# Patient Record
Sex: Male | Born: 1970 | Hispanic: Yes | Marital: Married | Smoking: Never smoker
Health system: Southern US, Community
[De-identification: ages and names within clinical notes are randomized; demographics above are authoritative.]

## PROBLEM LIST (undated history)

## (undated) DIAGNOSIS — Z9989 Dependence on other enabling machines and devices: Secondary | ICD-10-CM

## (undated) DIAGNOSIS — E119 Type 2 diabetes mellitus without complications: Secondary | ICD-10-CM

## (undated) DIAGNOSIS — L989 Disorder of the skin and subcutaneous tissue, unspecified: Secondary | ICD-10-CM

---

## 2017-07-17 ENCOUNTER — Emergency Department (HOSPITAL_COMMUNITY)
Admission: EM | Admit: 2017-07-17 | Discharge: 2017-07-17 | Disposition: A | Discharge status: Home / Self Care | Payer: Self-pay | Attending: Emergency Medicine | Admitting: Emergency Medicine

## 2017-07-17 ENCOUNTER — Encounter (HOSPITAL_COMMUNITY): Payer: Self-pay

## 2017-07-17 ENCOUNTER — Emergency Department (HOSPITAL_COMMUNITY): Payer: Self-pay

## 2017-07-17 ENCOUNTER — Other Ambulatory Visit: Payer: Self-pay

## 2017-07-17 DIAGNOSIS — E119 Type 2 diabetes mellitus without complications: Secondary | ICD-10-CM | POA: Insufficient documentation

## 2017-07-17 DIAGNOSIS — R1110 Vomiting, unspecified: Secondary | ICD-10-CM | POA: Insufficient documentation

## 2017-07-17 DIAGNOSIS — R109 Unspecified abdominal pain: Secondary | ICD-10-CM

## 2017-07-17 DIAGNOSIS — R1084 Generalized abdominal pain: Secondary | ICD-10-CM | POA: Insufficient documentation

## 2017-07-17 HISTORY — DX: Type 2 diabetes mellitus without complications: E11.9

## 2017-07-17 LAB — COMPREHENSIVE METABOLIC PANEL
ALT: 19 U/L (ref 17–63)
ANION GAP: 8 (ref 5–15)
AST: 16 U/L (ref 15–41)
Albumin: 4 g/dL (ref 3.5–5.0)
Alkaline Phosphatase: 63 U/L (ref 38–126)
BILIRUBIN TOTAL: 0.8 mg/dL (ref 0.3–1.2)
BUN: 16 mg/dL (ref 6–20)
CHLORIDE: 104 mmol/L (ref 101–111)
CO2: 26 mmol/L (ref 22–32)
Calcium: 9.2 mg/dL (ref 8.9–10.3)
Creatinine, Ser: 0.81 mg/dL (ref 0.61–1.24)
GFR calc non Af Amer: >60 mL/min (ref >60)
Glucose, Bld: 221 mg/dL — ABNORMAL HIGH (ref 65–99)
POTASSIUM: 4.1 mmol/L (ref 3.5–5.1)
Sodium: 138 mmol/L (ref 135–145)
TOTAL PROTEIN: 6.9 g/dL (ref 6.5–8.1)

## 2017-07-17 LAB — URINALYSIS, ROUTINE W REFLEX MICROSCOPIC
BILIRUBIN URINE: NEGATIVE
Bacteria, UA: NONE SEEN
Ketones, ur: 20 mg/dL — AB
LEUKOCYTES UA: NEGATIVE
NITRITE: NEGATIVE
Protein, ur: >=300 mg/dL — AB
SPECIFIC GRAVITY, URINE: 1.032 — AB (ref 1.005–1.030)
pH: 5 (ref 5.0–8.0)

## 2017-07-17 LAB — CBC WITH DIFFERENTIAL/PLATELET
BASOS ABS: 0.1 10*3/uL (ref 0.0–0.1)
Basophils Relative: 1 %
EOS PCT: 9 %
Eosinophils Absolute: 0.6 10*3/uL (ref 0.0–0.7)
HEMATOCRIT: 47 % (ref 39.0–52.0)
Hemoglobin: 16.2 g/dL (ref 13.0–17.0)
LYMPHS ABS: 2.4 10*3/uL (ref 0.7–4.0)
LYMPHS PCT: 36 %
MCH: 26.6 pg (ref 26.0–34.0)
MCHC: 34.5 g/dL (ref 30.0–36.0)
MCV: 77.2 fL — AB (ref 78.0–100.0)
MONOS PCT: 5 %
Monocytes Absolute: 0.4 10*3/uL (ref 0.1–1.0)
NEUTROS ABS: 3.3 10*3/uL (ref 1.7–7.7)
Neutrophils Relative %: 49 %
PLATELETS: 139 10*3/uL — AB (ref 150–400)
RBC: 6.09 MIL/uL — ABNORMAL HIGH (ref 4.22–5.81)
RDW: 13.3 % (ref 11.5–15.5)
WBC: 6.7 10*3/uL (ref 4.0–10.5)

## 2017-07-17 LAB — LIPASE, BLOOD: LIPASE: 30 U/L (ref 11–51)

## 2017-07-17 LAB — CBG MONITORING, ED: GLUCOSE-CAPILLARY: 233 mg/dL — AB (ref 65–99)

## 2017-07-17 IMAGING — CT CT RENAL STONE PROTOCOL
2 of 4 series · 16 of 46 positions shown, 18 images · non-contrast
Comparison: No priors.

CLINICAL DATA: 39-year-old male with history of right-sided flank
pain for the past 3-4 days.

EXAM:
CT ABDOMEN AND PELVIS WITHOUT CONTRAST
TECHNIQUE: Multidetector CT imaging of the abdomen and pelvis was performed
following the standard protocol without IV contrast.

[Series 2: axial st · axial · 0.83mm/px · z∈[+820,+1254]mm · 13 of 99 slices shown, 15 images]
[im 6/99  soft-tissue]
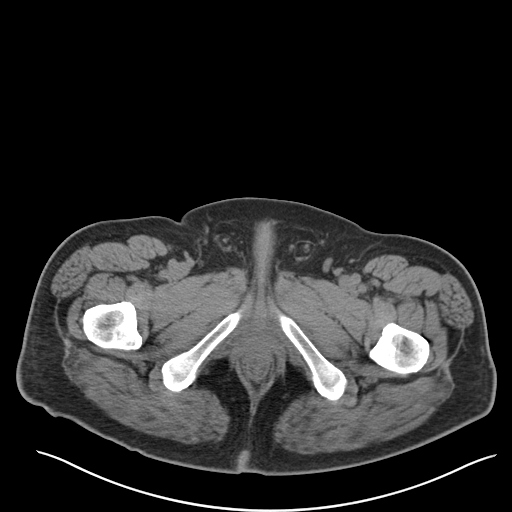
[im 6/99  bone]
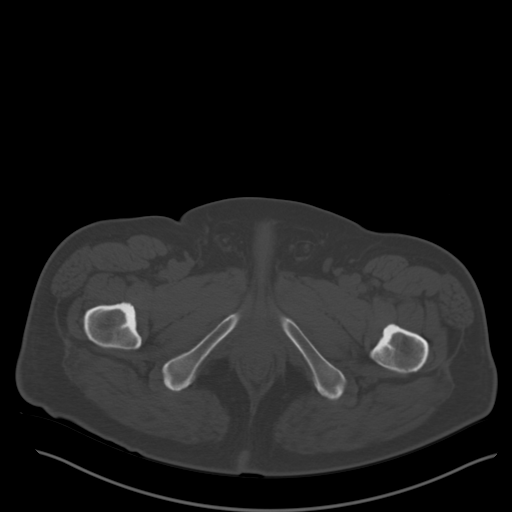
[im 12/99  soft-tissue]
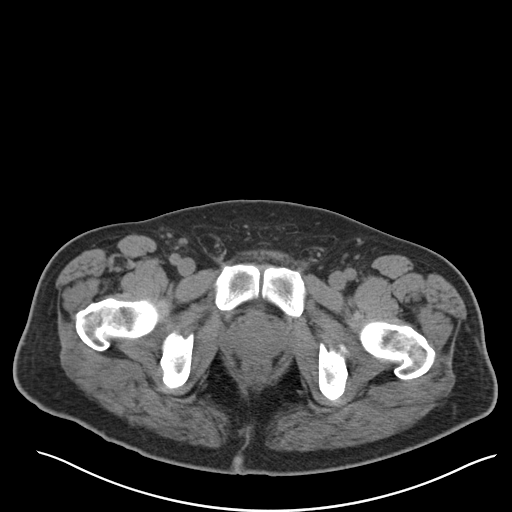
[im 24/99  soft-tissue]
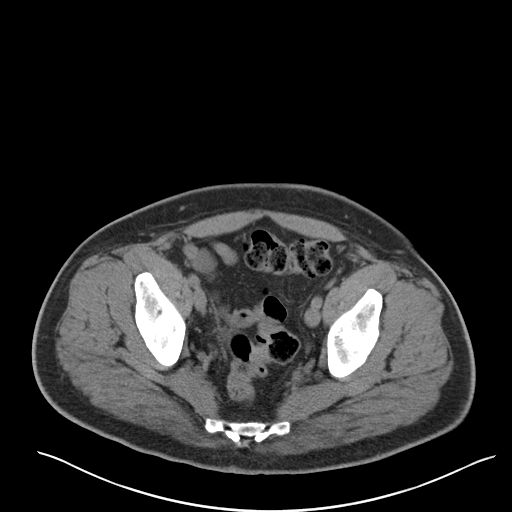
[im 29/99  soft-tissue]
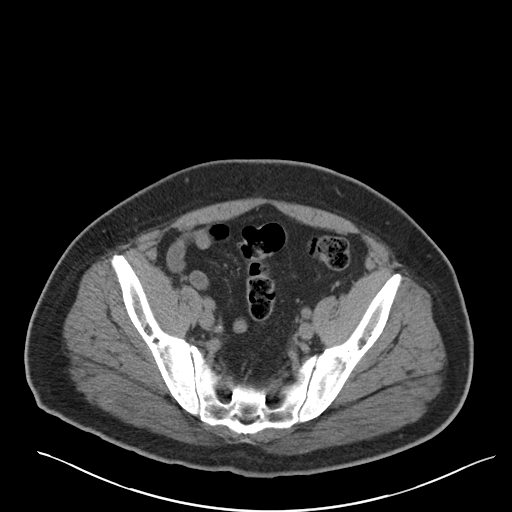
[im 35/99  soft-tissue]
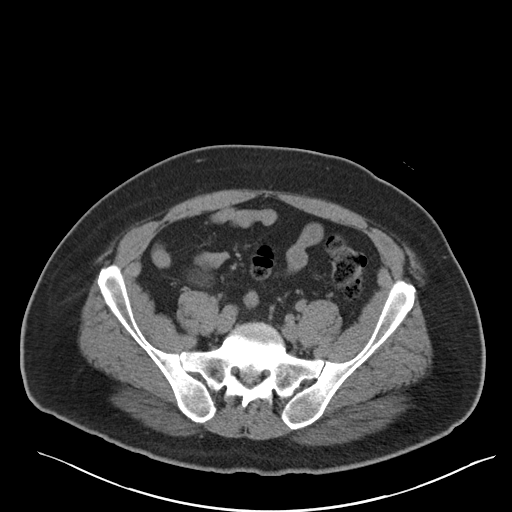
[im 41/99  soft-tissue]
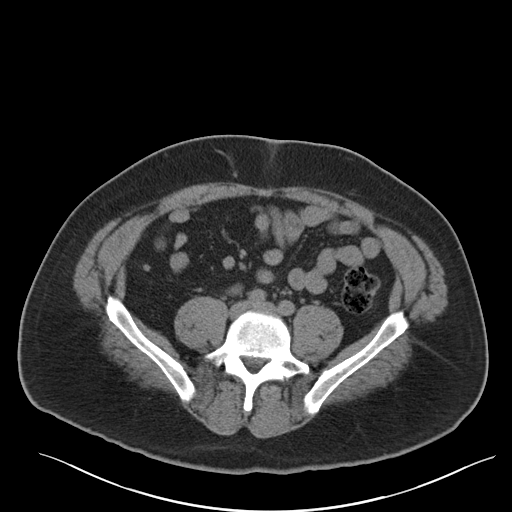
[im 52/99  soft-tissue]
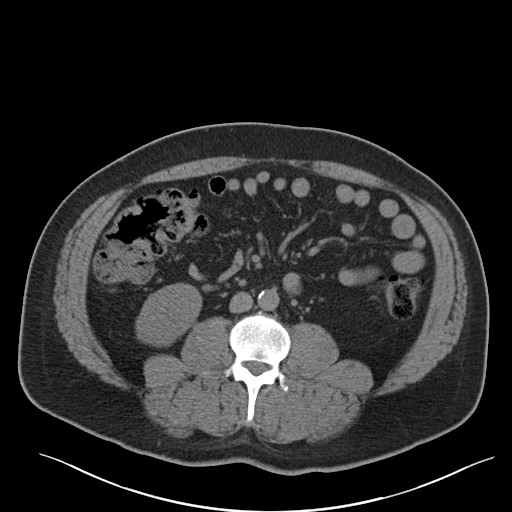
[im 58/99  soft-tissue]
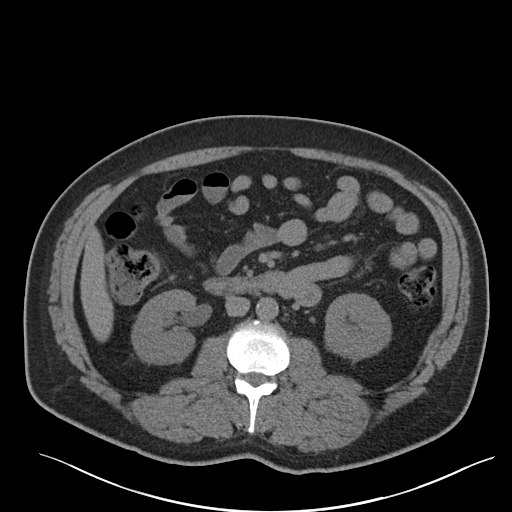
[im 64/99  soft-tissue]
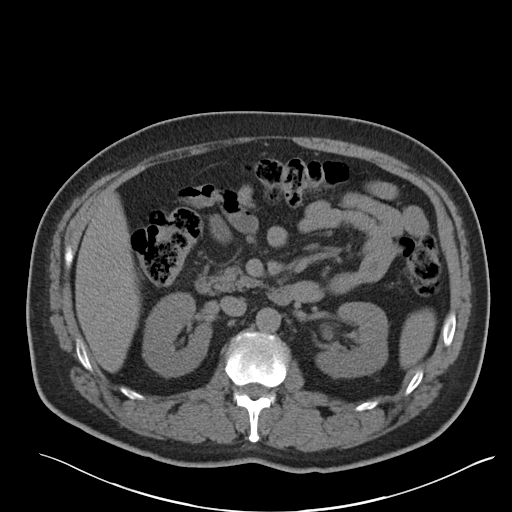
[im 64/99  bone]
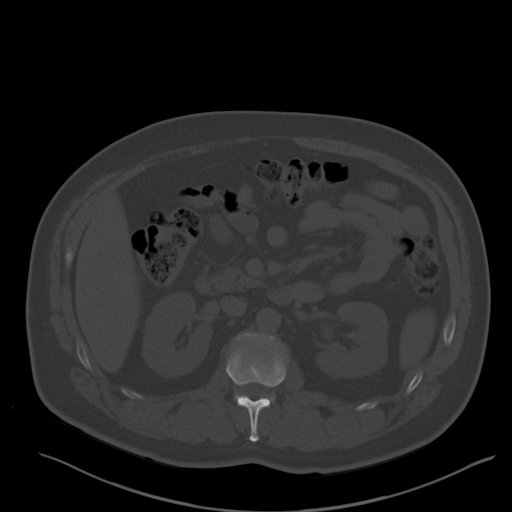
[im 70/99  soft-tissue]
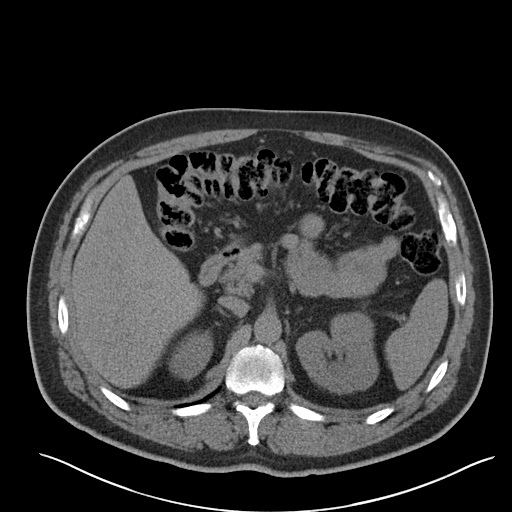
[im 75/99  soft-tissue]
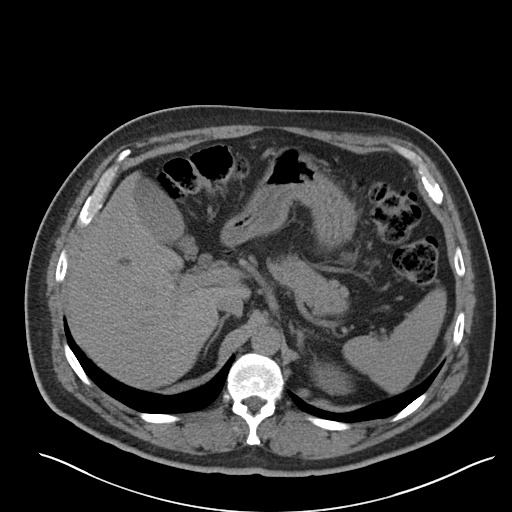
[im 87/99  soft-tissue]
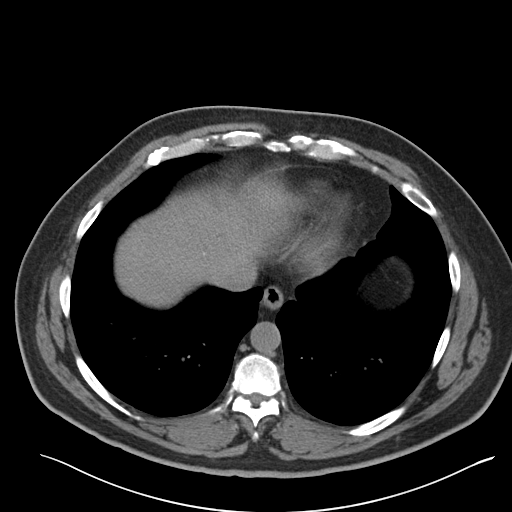
[im 93/99  soft-tissue]
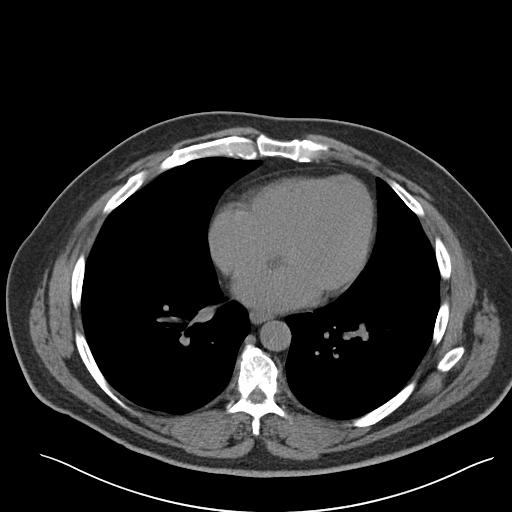

[Series 5: coronal · coronal · 0.79mm/px · 3 of 155 slices shown]
[im 52/155  soft-tissue]
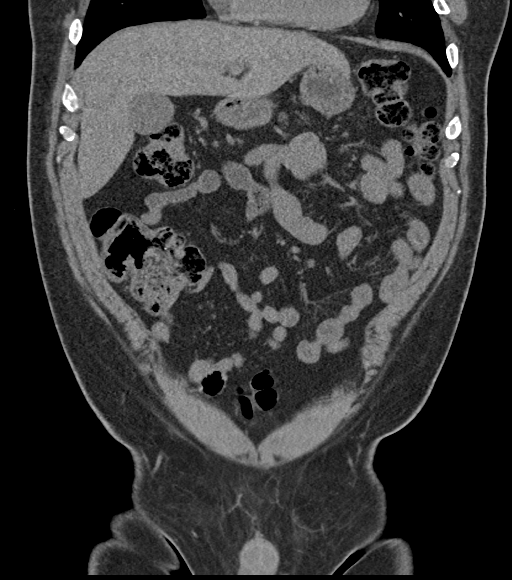
[im 69/155  soft-tissue]
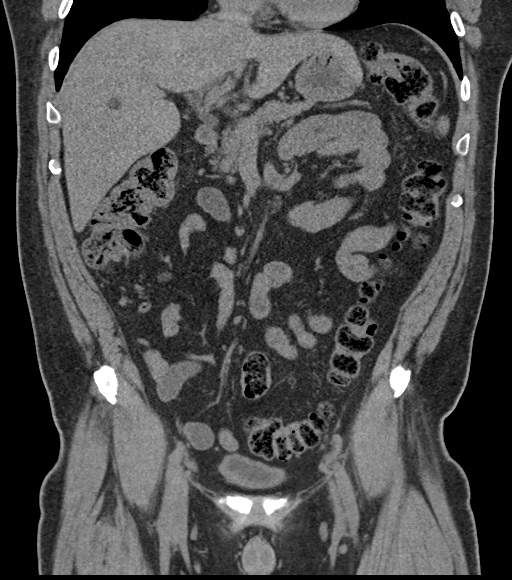
[im 86/155  soft-tissue]
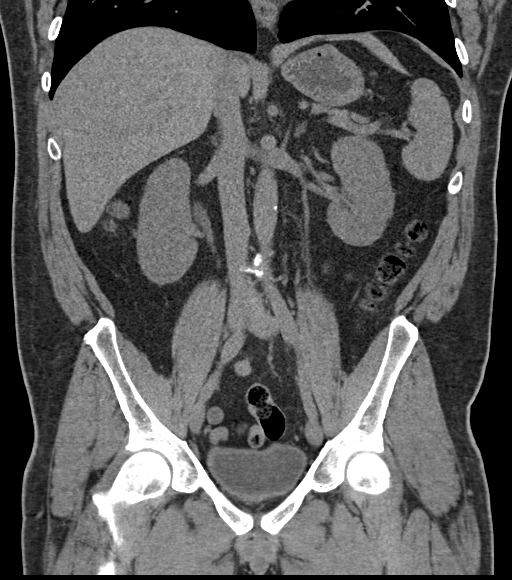

[16 of 46 positions shown; findings below may reference images not displayed]

FINDINGS: Lower chest: Unremarkable.

Hepatobiliary: 12 mm low-attenuation lesion in segment 5 of the
liver, incompletely characterized on today's noncontrast CT
examination, but statistically likely to represent tiny cyst. No
other larger more suspicious appearing hepatic lesions. Gallbladder
is unremarkable in appearance.

Pancreas: No definite pancreatic mass or peripancreatic fluid or
inflammatory changes are noted on today's noncontrast CT
examination.

Spleen: Unremarkable.

Adrenals/Urinary Tract: There are no abnormal calcifications within
the collecting system of either kidney, along the course of either
ureter, or within the lumen of the urinary bladder. No
hydroureteronephrosis or perinephric stranding to suggest urinary
tract obstruction at this time. The unenhanced appearance of the
kidneys is unremarkable bilaterally. Unenhanced appearance of the
urinary bladder is normal. Bilateral adrenal glands are normal in
appearance.

Stomach/Bowel: Normal appearance of the stomach. No pathologic
dilatation of small bowel or colon. Normal appendix.

Vascular/Lymphatic: Aortic atherosclerosis. No lymphadenopathy noted
in the abdomen or pelvis.

Reproductive: Prostate gland and seminal vesicles are unremarkable
in appearance.

Other: No significant volume of ascites.  No pneumoperitoneum.

Musculoskeletal: T 6 here are no aggressive appearing lytic or
blastic lesions noted in the visualized portions of the skeleton.
IMPRESSION: 1. No acute findings are noted in the abdomen or pelvis to account
for the patient's symptoms. Specifically, no urinary tract calculi
no findings of urinary tract obstruction are noted at this time.
2. Aortic atherosclerosis.

Aortic Atherosclerosis ([QS]-[QS]).

## 2017-07-17 MED ORDER — ONDANSETRON HCL 4 MG/2ML IJ SOLN
4.0000 mg | Freq: Once | INTRAMUSCULAR | Status: AC
  Administered 2017-07-17: 4 mg via INTRAVENOUS
  Filled 2017-07-17: qty 2

## 2017-07-17 MED ORDER — NAPROXEN 500 MG PO TABS: 500.0000 mg | ORAL_TABLET | Freq: Two times a day (BID) | ORAL | 0 refills | Status: DC

## 2017-07-17 MED ORDER — HYDROMORPHONE HCL 1 MG/ML IJ SOLN
0.5000 mg | Freq: Once | INTRAMUSCULAR | Status: AC
  Administered 2017-07-17: 0.5 mg via INTRAVENOUS
  Filled 2017-07-17: qty 1

## 2017-07-17 MED ORDER — TRAMADOL HCL 50 MG PO TABS: 50.0000 mg | ORAL_TABLET | Freq: Four times a day (QID) | ORAL | 0 refills | Status: DC | PRN

## 2017-07-17 NOTE — Discharge Instructions (Signed)
Please read and follow all provided instructions.  Your diagnoses today include:  1. Right flank pain    Tests performed today include:  Vital signs. See below for your results today.   Medications prescribed:   Tramadol - narcotic-like pain medication  DO NOT drive or perform any activities that require you to be awake and alert because this medicine can make you drowsy.    Naproxen - anti-inflammatory pain medication  Do not exceed 500mg  naproxen every 12 hours, take with food  You have been prescribed an anti-inflammatory medication or NSAID. Take with food. Take smallest effective dose for the shortest duration needed for your pain. Stop taking if you experience stomach pain or vomiting.   Take any prescribed medications only as directed.  Home care instructions:  Follow any educational materials contained in this packet.  BE VERY CAREFUL not to take multiple medicines containing Tylenol (also called acetaminophen). Doing so can lead to an overdose which can damage your liver and cause liver failure and possibly death.   Follow-up instructions: Please follow-up with your primary care provider in the next 2 days for further evaluation of your symptoms.   Return instructions:   Please return to the Emergency Department if you experience worsening symptoms.   Please return for worsening abdominal pain, persistent vomiting, fever.  Please return if you have any other emergent concerns.  Additional Information:  Your vital signs today were: BP (!) 152/88    Pulse 69    Temp 98.4 F (36.9 C) (Oral)    Resp 17    Ht 5\' 8"  (1.727 m)    Wt 99.8 kg (220 lb)    SpO2 100%    BMI 33.45 kg/m  If your blood pressure (BP) was elevated above 135/85 this visit, please have this repeated by your doctor within one month. --------------

## 2017-07-17 NOTE — ED Triage Notes (Signed)
Pt with right flank pain x 3-4 days.  No change in urination.  No hx of kidney stones.  No recent activity or injury.  Emesis early this morning.  No fever.

## 2017-07-17 NOTE — ED Notes (Signed)
Bed: WA07 Expected date:  Expected time:  Means of arrival:  Comments: 

## 2017-07-17 NOTE — ED Provider Notes (Signed)
[facility]-EMERGENCY DEPT Provider Note   CSN: [id number] Arrival date & time: 07/17/17  1221     History   Chief Complaint Chief Complaint  Patient presents with  . Flank Pain    HPI Richard Pittman is a 47 y.o. male.  Patient with no past surgical history, history of diabetes presents with 4-day history of right-sided flank pain with onset of vomiting this morning.  No reported fevers, chest pain, or shortness of breath.  No urinary changes, constipation, or diarrhea.  Pain is better when walking and sitting upright and worse when lying flat.  He denies any heavy NSAID use or alcohol use.  No injuries to the area.  No history of kidney stones.  Onset of symptoms acute.  Course is waxing and waning.     Past Medical History:  Diagnosis Date  . Diabetes mellitus without complication (HCC)     There are no active problems to display for this patient.   History reviewed. No pertinent surgical history.      Home Medications    Prior to Admission medications   Not on File    Family History History reviewed. No pertinent family history.  Social History Social History   Tobacco Use  . Smoking status: Never Smoker  . Smokeless tobacco: Never Used  Substance Use Topics  . Alcohol use: Not Currently  . Drug use: Never     Allergies   Patient has no known allergies.   Review of Systems Review of Systems  Constitutional: Negative for fever.  HENT: Negative for rhinorrhea and sore throat.   Eyes: Negative for redness.  Respiratory: Negative for cough.   Cardiovascular: Negative for chest pain.  Gastrointestinal: Positive for nausea and vomiting. Negative for abdominal pain and diarrhea.  Genitourinary: Positive for flank pain. Negative for dysuria.  Musculoskeletal: Negative for myalgias.  Skin: Negative for rash.  Neurological: Negative for headaches.     Physical Exam Updated Vital Signs BP (!) 156/90   Pulse 71   Temp  98.4 F (36.9 C) (Oral)   Resp 18   Ht 5\' 8"  (1.727 m)   Wt 99.8 kg (220 lb)   SpO2 100%   BMI 33.45 kg/m   Physical Exam  Constitutional: He appears well-developed and well-nourished.  HENT:  Head: Normocephalic and atraumatic.  Eyes: Conjunctivae are normal. Right eye exhibits no discharge. Left eye exhibits no discharge.  Neck: Normal range of motion. Neck supple.  Cardiovascular: Normal rate, regular rhythm and normal heart sounds.  Pulmonary/Chest: Effort normal and breath sounds normal.  Abdominal: Soft. There is tenderness (Mild to moderate, right lateral flank over inferior ribs). There is no rebound, no guarding, no tenderness at McBurney's point and negative Murphy's sign.  Neurological: He is alert.  Skin: Skin is warm and dry.  Psychiatric: He has a normal mood and affect.  Nursing note and vitals reviewed.    ED Treatments / Results  Labs (all labs ordered are listed, but only abnormal results are displayed) Labs Reviewed  URINALYSIS, ROUTINE W REFLEX MICROSCOPIC - Abnormal; Notable for the following components:      Result Value   Specific Gravity, Urine 1.032 (*)    Glucose, UA >=500 (*)    Hgb urine dipstick SMALL (*)    Ketones, ur 20 (*)    Protein, ur >=300 (*)    All other components within normal limits  CBC WITH DIFFERENTIAL/PLATELET - Abnormal; Notable for the following components:   RBC 6.09 (*)  MCV 77.2 (*)    Platelets 139 (*)    All other components within normal limits  COMPREHENSIVE METABOLIC PANEL - Abnormal; Notable for the following components:   Glucose, Bld 221 (*)    All other components within normal limits  CBG MONITORING, ED - Abnormal; Notable for the following components:   Glucose-Capillary 233 (*)    All other components within normal limits  LIPASE, BLOOD    EKG None  Radiology Ct Renal Stone Study  Result Date: 07/17/2017 CLINICAL DATA:  47 year old male with history of right-sided flank pain for the past 3-4 days.  EXAM: CT ABDOMEN AND PELVIS WITHOUT CONTRAST TECHNIQUE: Multidetector CT imaging of the abdomen and pelvis was performed following the standard protocol without IV contrast. COMPARISON:  No priors. FINDINGS: Lower chest: Unremarkable. Hepatobiliary: 12 mm low-attenuation lesion in segment 5 of the liver, incompletely characterized on today's noncontrast CT examination, but statistically likely to represent tiny cyst. No other larger more suspicious appearing hepatic lesions. Gallbladder is unremarkable in appearance. Pancreas: No definite pancreatic mass or peripancreatic fluid or inflammatory changes are noted on today's noncontrast CT examination. Spleen: Unremarkable. Adrenals/Urinary Tract: There are no abnormal calcifications within the collecting system of either kidney, along the course of either ureter, or within the lumen of the urinary bladder. No hydroureteronephrosis or perinephric stranding to suggest urinary tract obstruction at this time. The unenhanced appearance of the kidneys is unremarkable bilaterally. Unenhanced appearance of the urinary bladder is normal. Bilateral adrenal glands are normal in appearance. Stomach/Bowel: Normal appearance of the stomach. No pathologic dilatation of small bowel or colon. Normal appendix. Vascular/Lymphatic: Aortic atherosclerosis. No lymphadenopathy noted in the abdomen or pelvis. Reproductive: Prostate gland and seminal vesicles are unremarkable in appearance. Other: No significant volume of ascites.  No pneumoperitoneum. Musculoskeletal: T 6 here are no aggressive appearing lytic or blastic lesions noted in the visualized portions of the skeleton. IMPRESSION: 1. No acute findings are noted in the abdomen or pelvis to account for the patient's symptoms. Specifically, no urinary tract calculi no findings of urinary tract obstruction are noted at this time. 2. Aortic atherosclerosis. Aortic Atherosclerosis (ICD10-I70.0). Electronically Signed   By: Trudie Reed M.D.   On: 07/17/2017 20:24    Procedures Procedures (including critical care time)  Medications Ordered in ED Medications  HYDROmorphone (DILAUDID) injection 0.5 mg (0.5 mg Intravenous Given 07/17/17 1958)  ondansetron (ZOFRAN) injection 4 mg (4 mg Intravenous Given 07/17/17 1957)     Initial Impression / Assessment and Plan / ED Course  I have reviewed the triage vital signs and the nursing notes.  Pertinent labs & imaging results that were available during my care of the patient were reviewed by me and considered in my medical decision making (see chart for details).     Patient seen and examined. Work-up initiated. Medications ordered. UA with small hgb, ? Stone. CT ordered.   Vital signs reviewed and are as follows: BP (!) 156/90   Pulse 71   Temp 98.4 F (36.9 C) (Oral)   Resp 18   Ht 5\' 8"  (1.727 m)   Wt 99.8 kg (220 lb)   SpO2 100%   BMI 33.45 kg/m   9:36 PM patient updated on results.  CT reviewed by myself.  Patient is feeling better.  No indications for admission.  Home with naproxen and tramadol. Patient counseled on use of narcotic pain medications. Counseled not to combine these medications with others containing tylenol. Urged not to drink alcohol, drive, or  perform any other activities that requires focus while taking these medications. The patient verbalizes understanding and agrees with the plan.  The patient was urged to return to the Emergency Department immediately with worsening of current symptoms, worsening abdominal pain, persistent vomiting, blood noted in stools, fever, or any other concerns. The patient verbalized understanding.    Final Clinical Impressions(s) / ED Diagnoses   Final diagnoses:  Right flank pain   Patient with flank pain, likely MSK pain. Vitals are stable, no fever. Labs normal. Imaging reassuring. No signs of dehydration, patient is tolerating PO's. Lungs are clear and no signs suggestive of PNA. Low concern for  appendicitis, cholecystitis, pancreatitis, ruptured viscus, UTI, kidney stone, aortic dissection, aortic aneurysm or other emergent abdominal etiology. Supportive therapy indicated with return if symptoms worsen.    ED Discharge Orders        Ordered    traMADol (ULTRAM) 50 MG tablet  Every 6 hours PRN     07/17/17 2132    naproxen (NAPROSYN) 500 MG tablet  2 times daily     07/17/17 2132       Renne CriglerGeiple, Sheron Robin, PA-C 07/17/17 2138    Cathren LaineSteinl, Kevin, MD 07/17/17 [phone]

## 2018-12-20 NOTE — Progress Notes (Signed)
[facility] - Clinic Note  12/21/2018     CHIEF COMPLAINT Patient presents for Retina Evaluation and Diabetic Eye Exam   HISTORY OF PRESENT ILLNESS: Richard Pittman is a 48 y.o. male who presents to the clinic today for:   HPI    Retina Evaluation    In both eyes.  This started 2 months ago.  Associated Symptoms Floaters.  Negative for Flashes, Pain, Trauma, Fever, Weight Loss, Scalp Tenderness, Redness, Distortion, Photophobia, Jaw Claudication, Fatigue, Shoulder/Hip pain, Glare and Blind Spot.  Context:  distance vision, mid-range vision and near vision.  Treatments tried include artificial tears.  Response to treatment was no improvement.  I, the attending physician,  performed the HPI with the patient and updated documentation appropriately.          Diabetic Eye Exam    Vision is stable.  Associated Symptoms Negative for Flashes, Pain, Trauma, Fever, Weight Loss, Scalp Tenderness, Redness, Floaters, Distortion, Photophobia, Jaw Claudication, Fatigue, Shoulder/Hip pain, Glare and Blind Spot.  Diabetes characteristics include Type 2.  This started 10 years ago.  Blood sugar level is controlled.  I, the attending physician,  performed the HPI with the patient and updated documentation appropriately.          Comments    Referral of Dr. Krista Blue for DME OU. Patient is accompanied by interpretor Raquel of [facility], patient states 3-4 weeks ago "everything was dark, I could only see white in my right eye, now I could see things".Pt  Is seeing "black spots" Os x 2 months, denies ocular pain and flashes.  Pt is DM2 x 10 yrs, BS 124 this am, A1C 9.0 (2 mos ago). Pt states his Bs are "less than 190"        Last edited by Rennis Chris, MD on 12/21/2018  8:55 AM. (History)    pt is with an interpreter today, he states he has had a problem with his vision for about 5 months, he states he started seeing black spots in his left eye, but they went away and then he started  seeing black spots in his right eye, he states about 2 months ago he could only see white light with his right eye, he states recently he is starting to see a little bit more with his right eye, he states he has a black spot on the white part of his eye that has gotten bigger in the past couple of months, pt states yesterday was the first time he has seen an eye dr, pt endorses being diabetic, he states his A1c was close to 9 about 2 months ago, which is the highest it has ever been, he states he takes oral medication for diabetes, he does not take any medication for blood pressure or cholesterol, his mother is also diabetic, but he does not think she has any vision problems, 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 [place], [place] [address]  HISTORICAL INFORMATION:   Selected notes from the MEDICAL RECORD NUMBER Referred by Dr. Krista Blue for DM with traction RD  PMH: DM   CURRENT MEDICATIONS: No current outpatient medications on file. (Ophthalmic Drugs)   No current facility-administered medications for this visit.  (Ophthalmic Drugs)   Current Outpatient Medications (Other)  Medication Sig  . glipiZIDE (GLUCOTROL) 5 MG tablet Take by mouth 2 (two) times daily before a meal.  . metFORMIN (GLUMETZA) 1000 MG (MOD) 24 hr tablet Take 1,000  mg by mouth 2 (two) times daily with a meal.  . naproxen (NAPROSYN) 500 MG tablet Take 1 tablet (500 mg total) by mouth 2 (two) times daily.  . traMADol (ULTRAM) 50 MG tablet Take 1 tablet (50 mg total) by mouth every 6 (six) hours as needed.   No current facility-administered medications for this visit.  (Other)      REVIEW OF SYSTEMS: ROS    Positive for: Endocrine, Eyes   Negative for: Constitutional, Gastrointestinal, Neurological, Skin, Genitourinary, Musculoskeletal, HENT, Cardiovascular, Respiratory, Psychiatric, Allergic/Imm, Heme/Lymph   Last edited by Zenovia Jordan, LPN on 59/02/6382  6:65 AM.  (History)       ALLERGIES No Known Allergies  PAST MEDICAL HISTORY Past Medical History:  Diagnosis Date  . Diabetes mellitus without complication ([place])    History reviewed. No pertinent surgical history.  FAMILY HISTORY Family History  Problem Relation Age of Onset  . Diabetes Mother     SOCIAL HISTORY Social History   Tobacco Use  . Smoking status: Never Smoker  . Smokeless tobacco: Never Used  Substance Use Topics  . Alcohol use: Not Currently  . Drug use: Never         OPHTHALMIC EXAM:  Base Eye Exam    Visual Acuity (Snellen - Linear)      Right Left   Dist cc CF at 3' 20/400   Dist ph cc  NI       Tonometry (Tonopen, 8:32 AM)      Right Left   Pressure 18 16       Pupils      Dark Light Shape React APD   Right 3 2 Round Slow None   Left 3 2 Round Slow None       Visual Fields (Counting fingers)      Left Right   Restrictions Partial outer superior temporal, inferior temporal, superior nasal, inferior nasal deficiencies Partial outer superior temporal, inferior temporal, superior nasal, inferior nasal deficiencies       Extraocular Movement      Right Left    Full, Ortho Full, Ortho       Neuro/Psych    Oriented x3: Yes       Dilation    Both eyes: 1.0% Mydriacyl, 2.5% Phenylephrine @ 8:28 AM        Slit Lamp and Fundus Exam    Slit Lamp Exam      Right Left   Lids/Lashes Dermatochalasis - upper lid, Meibomian gland dysfunction Dermatochalasis - upper lid, Meibomian gland dysfunction   Conjunctiva/Sclera Small nasal and temporal Pinguecula Small nasal and temporal Pinguecula   Cornea Trace Punctate epithelial erosions Trace Punctate epithelial erosions   Anterior Chamber Deep and clear, narrow temporal angle Deep and clear, narrow temporal angle   Iris no NVI, Round and poorly dilated to 5.56mm Round and poorly dilated to 5.77mm, no NVI   Lens 2+ Nuclear sclerosis, 2+ Cortical cataract 2+ Nuclear sclerosis, 2+ Cortical cataract    Vitreous Diffuse vitreous Hemorrhage Scattered vitreous Hemorrhage, large blood clot inferiorly       Fundus Exam      Right Left   Disc Obscured by dense pre-retinal fibrosis and NVD +fibrotic NVD obscuring disc, +hemorrhage   Macula obscured by heme and fibrosis pre-retinal fibrosis, IRH, Exudates, severe tractional fibrosis and NVE along superior arcades   Vessels no view Fibrotic NVE and NVD   Periphery No view of retina, scattered vitreous fibrosis and hemorrhage Extensive fibrosis and old hemorrhage, large  pre-retinal hemorrhage at 0730          IMAGING AND PROCEDURES  Imaging and Procedures for @TODAY @  OCT, Retina - OU - Both Eyes       Left Eye Quality was poor. Central Foveal Thickness: 1028. Progression has no prior data. Findings include intraretinal fluid, subretinal fluid, abnormal foveal contour, vitreous traction, preretinal fibrosis.   Notes *Images captured and stored on drive  Diagnosis / Impression:  OD: no view OS: poor image; significant pre-retinal fibrosis with tractional edema  Clinical management:  See below  Abbreviations: NFP - Normal foveal profile. CME - cystoid macular edema. PED - pigment epithelial detachment. IRF - intraretinal fluid. SRF - subretinal fluid. EZ - ellipsoid zone. ERM - epiretinal membrane. ORA - outer retinal atrophy. ORT - outer retinal tubulation. SRHM - subretinal hyper-reflective material                 ASSESSMENT/PLAN:    ICD-10-CM   1. Proliferative diabetic retinopathy of both eyes with macular edema associated with type 2 diabetes mellitus (HCC)  R60.4540E11.3513   2. Retinal edema  H35.81 OCT, Retina - OU - Both Eyes  3. Vitreous hemorrhage of both eyes (HCC)  H43.13   4. Combined forms of age-related cataract of both eyes  H25.813     1-3. Proliferative diabetic retinopathy w/ vitreous hemorrhage and traction OU (OD > OS)  - The incidence, risk factors for progression, natural history and treatment options for  diabetic retinopathy were discussed with patient.    - The need for close monitoring of blood glucose, blood pressure, and serum lipids, avoiding cigarette or any type of tobacco, and the need for long term follow up was also discussed with patient.  - exam shows severe vitreous/preretinal/subhyaloid heme OU (OD>OS) and tractional fibrosis OU  - discussed findings, guarded prognosis and treatment options  - likely needs PPV OU  - [facility] and management  4. Mixed form age related cataract OU  - The symptoms of cataract, surgical options, and treatments and risks were discussed with patient.  - discussed diagnosis and progression   Ophthalmic Meds Ordered this visit:  No orders of the defined types were placed in this encounter.      Return if symptoms worsen or fail to improve.  There are no Patient Instructions on file for this visit.   Explained the diagnoses, plan, and follow up with the patient and they expressed understanding.  Patient expressed understanding of the importance of proper follow up care.   This document serves as a record of services personally performed by Karie ChimeraBrian G. Sriram Febles, MD, PhD. It was created on their behalf by Annalee Gentaaryl Barber, COMT. The creation of this record is the provider's dictation and/or activities during the visit.  Electronically signed by: Annalee Gentaaryl Barber, COMT 12/22/18 10:02 AM   Karie ChimeraBrian G. Allure Greaser, M.D., Ph.D. Diseases & Surgery of the Retina and Vitreous [facility]  I have reviewed the above documentation for accuracy and completeness, and I agree with the above. Karie ChimeraBrian G. Ruhani Umland, M.D., Ph.D. 12/22/18 10:02 AM     Abbreviations: M myopia (nearsighted); A astigmatism; H hyperopia (farsighted); P presbyopia; Mrx spectacle prescription;  CTL contact lenses; OD right eye; OS left eye; OU both eyes  XT exotropia; ET esotropia; PEK punctate epithelial keratitis; PEE punctate epithelial  erosions; DES dry eye syndrome; MGD meibomian gland dysfunction; ATs artificial tears; PFAT's preservative free artificial tears; NSC nuclear  sclerotic cataract; PSC posterior subcapsular cataract; ERM epi-retinal membrane; PVD posterior vitreous detachment; RD retinal detachment; DM diabetes mellitus; DR diabetic retinopathy; NPDR non-proliferative diabetic retinopathy; PDR proliferative diabetic retinopathy; CSME clinically significant macular edema; DME diabetic macular edema; dbh dot blot hemorrhages; CWS cotton wool spot; POAG primary open angle glaucoma; C/D cup-to-disc ratio; HVF humphrey visual field; GVF goldmann visual field; OCT optical coherence tomography; IOP intraocular pressure; BRVO Branch retinal vein occlusion; CRVO central retinal vein occlusion; CRAO central retinal artery occlusion; BRAO branch retinal artery occlusion; RT retinal tear; SB scleral buckle; PPV pars plana vitrectomy; VH Vitreous hemorrhage; PRP panretinal laser photocoagulation; IVK intravitreal kenalog; VMT vitreomacular traction; MH Macular hole;  NVD neovascularization of the disc; NVE neovascularization elsewhere; AREDS age related eye disease study; ARMD age related macular degeneration; POAG primary open angle glaucoma; EBMD epithelial/anterior basement membrane dystrophy; ACIOL anterior chamber intraocular lens; IOL intraocular lens; PCIOL posterior chamber intraocular lens; Phaco/IOL phacoemulsification with intraocular lens placement; PRK photorefractive keratectomy; LASIK laser assisted in situ keratomileusis; HTN hypertension; DM diabetes mellitus; COPD chronic obstructive pulmonary disease

## 2018-12-21 ENCOUNTER — Encounter (INDEPENDENT_AMBULATORY_CARE_PROVIDER_SITE_OTHER): Payer: Self-pay | Admitting: Ophthalmology

## 2018-12-21 ENCOUNTER — Ambulatory Visit (INDEPENDENT_AMBULATORY_CARE_PROVIDER_SITE_OTHER): Payer: Self-pay | Admitting: Ophthalmology

## 2018-12-21 DIAGNOSIS — H4313 Vitreous hemorrhage, bilateral: Secondary | ICD-10-CM

## 2018-12-21 DIAGNOSIS — H25813 Combined forms of age-related cataract, bilateral: Secondary | ICD-10-CM

## 2018-12-21 DIAGNOSIS — E113513 Type 2 diabetes mellitus with proliferative diabetic retinopathy with macular edema, bilateral: Secondary | ICD-10-CM

## 2018-12-21 DIAGNOSIS — H3581 Retinal edema: Secondary | ICD-10-CM

## 2019-07-18 HISTORY — PX: CATARACT EXTRACTION: SUR2

## 2021-01-06 ENCOUNTER — Other Ambulatory Visit: Payer: Self-pay

## 2021-01-06 ENCOUNTER — Encounter (HOSPITAL_COMMUNITY): Payer: Self-pay

## 2021-01-06 ENCOUNTER — Ambulatory Visit (HOSPITAL_COMMUNITY)
Admission: EM | Admit: 2021-01-06 | Discharge: 2021-01-06 | Disposition: A | Discharge status: Home / Self Care | Payer: Self-pay | Attending: Urgent Care | Admitting: Urgent Care

## 2021-01-06 ENCOUNTER — Ambulatory Visit (INDEPENDENT_AMBULATORY_CARE_PROVIDER_SITE_OTHER): Payer: Self-pay

## 2021-01-06 DIAGNOSIS — M79671 Pain in right foot: Secondary | ICD-10-CM

## 2021-01-06 DIAGNOSIS — L089 Local infection of the skin and subcutaneous tissue, unspecified: Secondary | ICD-10-CM | POA: Insufficient documentation

## 2021-01-06 DIAGNOSIS — M7989 Other specified soft tissue disorders: Secondary | ICD-10-CM | POA: Insufficient documentation

## 2021-01-06 DIAGNOSIS — M79674 Pain in right toe(s): Secondary | ICD-10-CM | POA: Insufficient documentation

## 2021-01-06 DIAGNOSIS — E119 Type 2 diabetes mellitus without complications: Secondary | ICD-10-CM | POA: Insufficient documentation

## 2021-01-06 DIAGNOSIS — E11628 Type 2 diabetes mellitus with other skin complications: Secondary | ICD-10-CM

## 2021-01-06 DIAGNOSIS — T148XXA Other injury of unspecified body region, initial encounter: Secondary | ICD-10-CM | POA: Insufficient documentation

## 2021-01-06 LAB — COMPREHENSIVE METABOLIC PANEL
ALT: 17 U/L (ref 0–44)
AST: 15 U/L (ref 15–41)
Albumin: 2.9 g/dL — ABNORMAL LOW (ref 3.5–5.0)
Alkaline Phosphatase: 91 U/L (ref 38–126)
Anion gap: 9 (ref 5–15)
BUN: 19 mg/dL (ref 6–20)
CO2: 23 mmol/L (ref 22–32)
Calcium: 8.5 mg/dL — ABNORMAL LOW (ref 8.9–10.3)
Chloride: 99 mmol/L (ref 98–111)
Creatinine, Ser: 1.49 mg/dL — ABNORMAL HIGH (ref 0.61–1.24)
GFR, Estimated: 56 mL/min — ABNORMAL LOW (ref >60)
Glucose, Bld: 402 mg/dL — ABNORMAL HIGH (ref 70–99)
Potassium: 5.1 mmol/L (ref 3.5–5.1)
Sodium: 131 mmol/L — ABNORMAL LOW (ref 135–145)
Total Bilirubin: 0.6 mg/dL (ref 0.3–1.2)
Total Protein: 6.6 g/dL (ref 6.5–8.1)

## 2021-01-06 LAB — CBC WITH DIFFERENTIAL/PLATELET
Abs Immature Granulocytes: 0.07 10*3/uL (ref 0.00–0.07)
Basophils Absolute: 0.1 10*3/uL (ref 0.0–0.1)
Basophils Relative: 0 %
Eosinophils Absolute: 0.1 10*3/uL (ref 0.0–0.5)
Eosinophils Relative: 1 %
HCT: 35.8 % — ABNORMAL LOW (ref 39.0–52.0)
Hemoglobin: 11.6 g/dL — ABNORMAL LOW (ref 13.0–17.0)
Immature Granulocytes: 1 %
Lymphocytes Relative: 8 %
Lymphs Abs: 1.1 10*3/uL (ref 0.7–4.0)
MCH: 25.6 pg — ABNORMAL LOW (ref 26.0–34.0)
MCHC: 32.4 g/dL (ref 30.0–36.0)
MCV: 79 fL — ABNORMAL LOW (ref 80.0–100.0)
Monocytes Absolute: 0.7 10*3/uL (ref 0.1–1.0)
Monocytes Relative: 5 %
Neutro Abs: 12.1 10*3/uL — ABNORMAL HIGH (ref 1.7–7.7)
Neutrophils Relative %: 85 %
Platelets: 257 10*3/uL (ref 150–400)
RBC: 4.53 MIL/uL (ref 4.22–5.81)
RDW: 12.8 % (ref 11.5–15.5)
WBC: 14.1 10*3/uL — ABNORMAL HIGH (ref 4.0–10.5)
nRBC: 0 % (ref 0.0–0.2)

## 2021-01-06 IMAGING — DX DG FOOT COMPLETE 3+V*R*
3 series · 3 of 3 positions shown · non-contrast
Comparison: None.

CLINICAL DATA: Right foot pain.  History of puncture wound

EXAM:
RIGHT FOOT COMPLETE - 3+ VIEW

[foot ap]
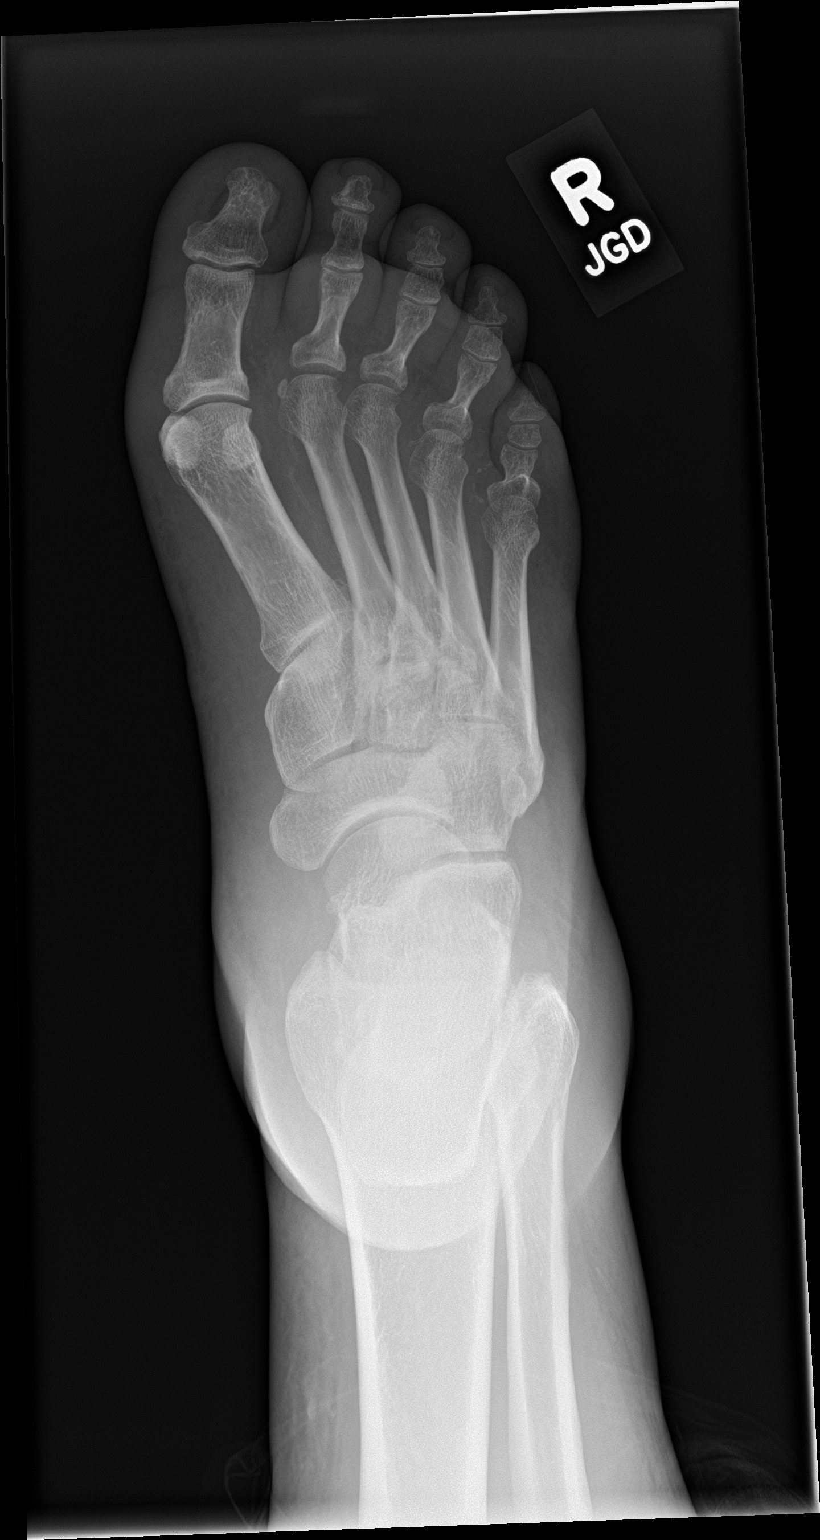

[foot obl]
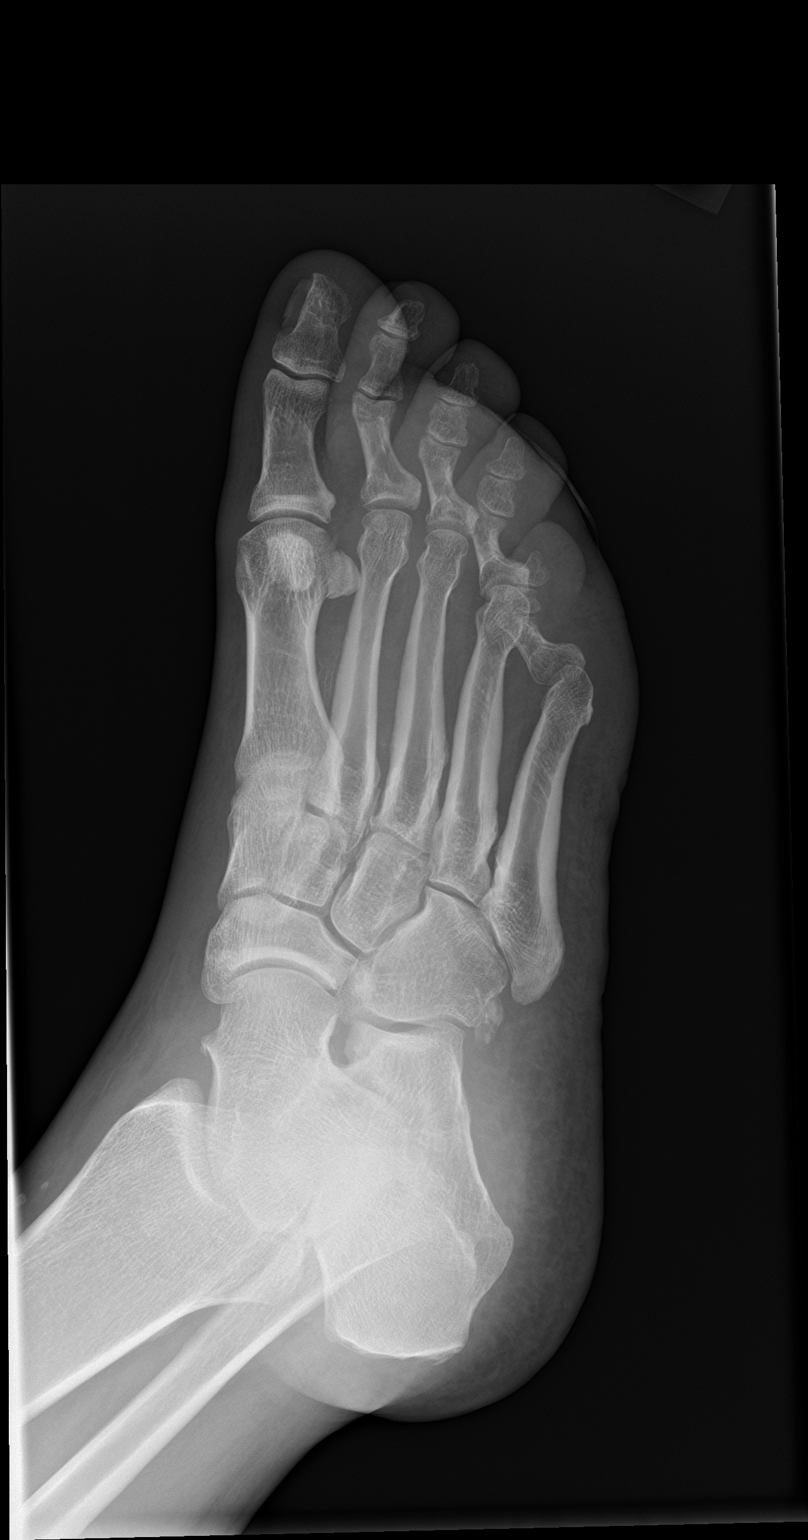

[foot lat]
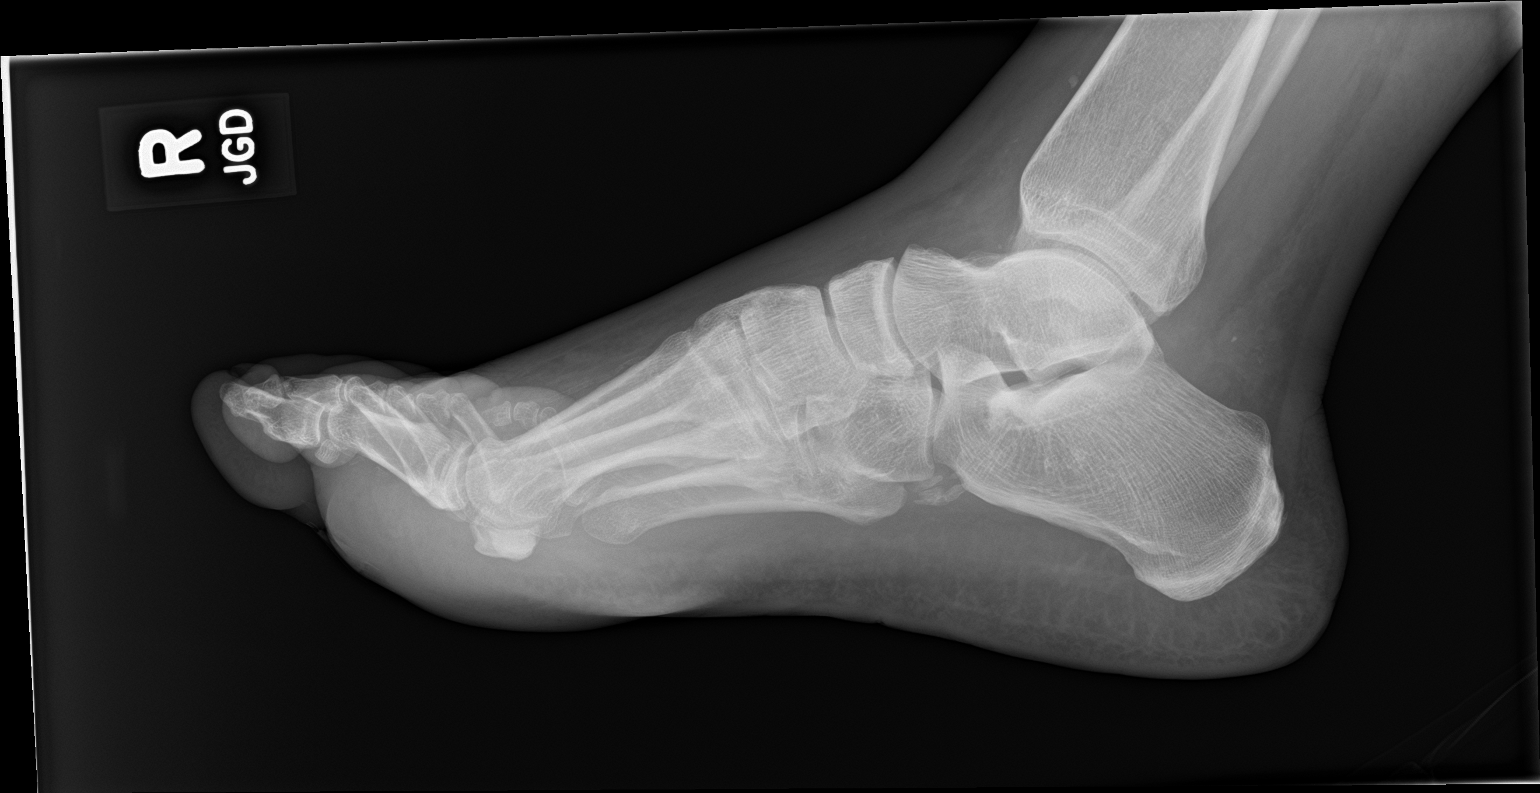

[3 of 3 positions shown; findings below may reference images not displayed]

FINDINGS: There is no evidence of fracture or dislocation. Joint spaces are
relatively preserved. No erosion or periosteal elevation is evident.
Soft tissue swelling the plantar aspect of the foot in the region of
the distal forefoot. No soft tissue gas or radiopaque foreign body
evident. Vascular calcifications are present.
IMPRESSION: 1. Soft tissue swelling of the plantar aspect of the foot.
2. No acute osseous abnormality.  No radiopaque foreign body.

## 2021-01-06 MED ORDER — DOXYCYCLINE HYCLATE 100 MG PO CAPS: 100.0000 mg | ORAL_CAPSULE | Freq: Two times a day (BID) | ORAL | 0 refills | Status: DC

## 2021-01-06 MED ORDER — IBUPROFEN 800 MG PO TABS
ORAL_TABLET | ORAL | Status: AC
  Filled 2021-01-06: qty 1

## 2021-01-06 MED ORDER — NAPROXEN 500 MG PO TABS: 500.0000 mg | ORAL_TABLET | Freq: Two times a day (BID) | ORAL | 0 refills | Status: DC

## 2021-01-06 MED ORDER — IBUPROFEN 800 MG PO TABS
800.0000 mg | ORAL_TABLET | Freq: Once | ORAL | Status: AC
  Administered 2021-01-06: 800 mg via ORAL

## 2021-01-06 NOTE — ED Provider Notes (Signed)
[facility]   MRN: [id number] DOB: 1970-02-26  Subjective:   Richard Pittman is a 50 y.o. male presenting for 2-3 week history of persistent and worsening right foot pain, swelling, having drainage of pus in the past day.  Symptoms started after he stepped through a nail that punctured into his shoe and scraped the lower part of his foot.  He was seen and had his Tdap updated, was started on cephalexin.  He completed the entire course of antibiotics but still worsening.  Has not had fever, pain and has been using Tylenol with minimal relief.  No current facility-administered medications for this encounter.  Current Outpatient Medications:    glipiZIDE (GLUCOTROL) 5 MG tablet, Take by mouth 2 (two) times daily before a meal., Disp: , Rfl:    metFORMIN (GLUMETZA) 1000 MG (MOD) 24 hr tablet, Take 1,000 mg by mouth 2 (two) times daily with a meal., Disp: , Rfl:    naproxen (NAPROSYN) 500 MG tablet, Take 1 tablet (500 mg total) by mouth 2 (two) times daily., Disp: 20 tablet, Rfl: 0   traMADol (ULTRAM) 50 MG tablet, Take 1 tablet (50 mg total) by mouth every 6 (six) hours as needed., Disp: 10 tablet, Rfl: 0   No Known Allergies  Past Medical History:  Diagnosis Date   Diabetes mellitus without complication (HCC)      History reviewed. No pertinent surgical history.  Family History  Problem Relation Age of Onset   Diabetes Mother     Social History   Tobacco Use   Smoking status: Never   Smokeless tobacco: Never  Vaping Use   Vaping Use: Never used  Substance Use Topics   Alcohol use: Not Currently   Drug use: Never    ROS   Objective:   Vitals: BP 133/75 (BP Location: Right Arm)   Pulse 95   Temp (!) 100.9 F (38.3 C) (Oral)   Resp 18   SpO2 96%   Physical Exam Constitutional:      General: He is not in acute distress.    Appearance: Normal appearance. He is well-developed. He is not ill-appearing, toxic-appearing or diaphoretic.   HENT:     Head: Normocephalic and atraumatic.     Right Ear: External ear normal.     Left Ear: External ear normal.     Nose: Nose normal.     Mouth/Throat:     Mouth: Mucous membranes are moist.     Pharynx: Oropharynx is clear.  Eyes:     General: No scleral icterus.    Extraocular Movements: Extraocular movements intact.     Pupils: Pupils are equal, round, and reactive to light.  Cardiovascular:     Rate and Rhythm: Normal rate and regular rhythm.     Heart sounds: Normal heart sounds. No murmur heard.   No friction rub. No gallop.  Pulmonary:     Effort: Pulmonary effort is normal. No respiratory distress.     Breath sounds: Normal breath sounds. No stridor. No wheezing, rhonchi or rales.  Musculoskeletal:     Right foot: Decreased range of motion. Normal capillary refill. Tenderness present. No swelling, deformity, laceration, bony tenderness or crepitus.       Feet:  Neurological:     Mental Status: He is alert and oriented to person, place, and time.  Psychiatric:        Mood and Affect: Mood normal.        Behavior: Behavior normal.  Thought Content: Thought content normal.    DG Foot Complete Right  Result Date: 01/06/2021 CLINICAL DATA:  Right foot pain.  History of puncture wound EXAM: RIGHT FOOT COMPLETE - 3+ VIEW COMPARISON:  None. FINDINGS: There is no evidence of fracture or dislocation. Joint spaces are relatively preserved. No erosion or periosteal elevation is evident. Soft tissue swelling the plantar aspect of the foot in the region of the distal forefoot. No soft tissue gas or radiopaque foreign body evident. Vascular calcifications are present. IMPRESSION: 1. Soft tissue swelling of the plantar aspect of the foot. 2. No acute osseous abnormality.  No radiopaque foreign body. Electronically Signed   By: Duanne Guess D.O.   On: 01/06/2021 12:25     Assessment and Plan :   PDMP not reviewed this encounter.  1. Wound infection   2. Pain and  swelling of toe of right foot   3. Right foot pain   4. Type 2 diabetes mellitus treated without insulin (HCC)    Will trial doxycycline, naproxen, reviewed wound care.  Lab results are pending.  Patient prefers to avoid the ER for now.  Emphasized strict precautions within 48 hours as he is high risk for having severe infection.  I was agreeable to a 48-hour window given that the x-ray did not show osteomyelitis or subcutaneous emphysema of the soft tissue. Counseled patient on potential for adverse effects with medications prescribed/recommended today, ER and return-to-clinic precautions discussed, patient verbalized understanding.    Wallis Bamberg, PA-C 01/06/21 1315

## 2021-01-06 NOTE — ED Triage Notes (Signed)
Pt reports swelling in right foot x 2 weeks, pus x 1 day. Reports he stepped over a nail. Pt was treated it with cephalexin and had a Tdap. Denies pain.

## 2021-01-07 ENCOUNTER — Emergency Department (HOSPITAL_COMMUNITY): Payer: Self-pay

## 2021-01-07 ENCOUNTER — Encounter (HOSPITAL_COMMUNITY): Payer: Self-pay

## 2021-01-07 ENCOUNTER — Inpatient Hospital Stay (HOSPITAL_COMMUNITY)
Admission: EM | Admit: 2021-01-07 | Discharge: 2021-01-11 | DRG: 854 | Disposition: A | Discharge status: Home / Self Care | Payer: Self-pay | Attending: Obstetrics and Gynecology | Admitting: Obstetrics and Gynecology

## 2021-01-07 ENCOUNTER — Inpatient Hospital Stay (HOSPITAL_COMMUNITY): Payer: Self-pay

## 2021-01-07 DIAGNOSIS — L97519 Non-pressure chronic ulcer of other part of right foot with unspecified severity: Secondary | ICD-10-CM | POA: Diagnosis present

## 2021-01-07 DIAGNOSIS — E669 Obesity, unspecified: Secondary | ICD-10-CM | POA: Diagnosis present

## 2021-01-07 DIAGNOSIS — L089 Local infection of the skin and subcutaneous tissue, unspecified: Principal | ICD-10-CM | POA: Diagnosis present

## 2021-01-07 DIAGNOSIS — E11621 Type 2 diabetes mellitus with foot ulcer: Secondary | ICD-10-CM | POA: Diagnosis present

## 2021-01-07 DIAGNOSIS — E1152 Type 2 diabetes mellitus with diabetic peripheral angiopathy with gangrene: Secondary | ICD-10-CM | POA: Diagnosis present

## 2021-01-07 DIAGNOSIS — Z20822 Contact with and (suspected) exposure to covid-19: Secondary | ICD-10-CM | POA: Diagnosis present

## 2021-01-07 DIAGNOSIS — R6520 Severe sepsis without septic shock: Secondary | ICD-10-CM | POA: Diagnosis present

## 2021-01-07 DIAGNOSIS — N179 Acute kidney failure, unspecified: Secondary | ICD-10-CM

## 2021-01-07 DIAGNOSIS — D638 Anemia in other chronic diseases classified elsewhere: Secondary | ICD-10-CM

## 2021-01-07 DIAGNOSIS — A401 Sepsis due to streptococcus, group B: Principal | ICD-10-CM | POA: Diagnosis present

## 2021-01-07 DIAGNOSIS — B999 Unspecified infectious disease: Secondary | ICD-10-CM

## 2021-01-07 DIAGNOSIS — Z79899 Other long term (current) drug therapy: Secondary | ICD-10-CM

## 2021-01-07 DIAGNOSIS — Z6833 Body mass index (BMI) 33.0-33.9, adult: Secondary | ICD-10-CM

## 2021-01-07 DIAGNOSIS — E1165 Type 2 diabetes mellitus with hyperglycemia: Secondary | ICD-10-CM | POA: Diagnosis present

## 2021-01-07 DIAGNOSIS — E871 Hypo-osmolality and hyponatremia: Secondary | ICD-10-CM

## 2021-01-07 DIAGNOSIS — L03115 Cellulitis of right lower limb: Secondary | ICD-10-CM | POA: Diagnosis present

## 2021-01-07 DIAGNOSIS — L02611 Cutaneous abscess of right foot: Secondary | ICD-10-CM | POA: Diagnosis present

## 2021-01-07 DIAGNOSIS — S91331A Puncture wound without foreign body, right foot, initial encounter: Secondary | ICD-10-CM | POA: Diagnosis present

## 2021-01-07 DIAGNOSIS — Z7984 Long term (current) use of oral hypoglycemic drugs: Secondary | ICD-10-CM

## 2021-01-07 DIAGNOSIS — E872 Acidosis, unspecified: Secondary | ICD-10-CM | POA: Diagnosis present

## 2021-01-07 DIAGNOSIS — Z833 Family history of diabetes mellitus: Secondary | ICD-10-CM

## 2021-01-07 DIAGNOSIS — Z9889 Other specified postprocedural states: Secondary | ICD-10-CM

## 2021-01-07 DIAGNOSIS — D509 Iron deficiency anemia, unspecified: Secondary | ICD-10-CM | POA: Diagnosis present

## 2021-01-07 DIAGNOSIS — W450XXA Nail entering through skin, initial encounter: Secondary | ICD-10-CM

## 2021-01-07 LAB — SEDIMENTATION RATE: Sed Rate: 92 mm/hr — ABNORMAL HIGH (ref 0–16)

## 2021-01-07 LAB — CBC WITH DIFFERENTIAL/PLATELET
Abs Immature Granulocytes: 0.12 10*3/uL — ABNORMAL HIGH (ref 0.00–0.07)
Basophils Absolute: 0.1 10*3/uL (ref 0.0–0.1)
Basophils Relative: 1 %
Eosinophils Absolute: 0.2 10*3/uL (ref 0.0–0.5)
Eosinophils Relative: 1 %
HCT: 31.9 % — ABNORMAL LOW (ref 39.0–52.0)
Hemoglobin: 10.6 g/dL — ABNORMAL LOW (ref 13.0–17.0)
Immature Granulocytes: 1 %
Lymphocytes Relative: 6 %
Lymphs Abs: 0.9 10*3/uL (ref 0.7–4.0)
MCH: 25.9 pg — ABNORMAL LOW (ref 26.0–34.0)
MCHC: 33.2 g/dL (ref 30.0–36.0)
MCV: 78 fL — ABNORMAL LOW (ref 80.0–100.0)
Monocytes Absolute: 0.8 10*3/uL (ref 0.1–1.0)
Monocytes Relative: 6 %
Neutro Abs: 12.6 10*3/uL — ABNORMAL HIGH (ref 1.7–7.7)
Neutrophils Relative %: 85 %
Platelets: 256 10*3/uL (ref 150–400)
RBC: 4.09 MIL/uL — ABNORMAL LOW (ref 4.22–5.81)
RDW: 13 % (ref 11.5–15.5)
WBC: 14.7 10*3/uL — ABNORMAL HIGH (ref 4.0–10.5)
nRBC: 0 % (ref 0.0–0.2)

## 2021-01-07 LAB — COMPREHENSIVE METABOLIC PANEL
ALT: 15 U/L (ref 0–44)
AST: 12 U/L — ABNORMAL LOW (ref 15–41)
Albumin: 3 g/dL — ABNORMAL LOW (ref 3.5–5.0)
Alkaline Phosphatase: 82 U/L (ref 38–126)
Anion gap: 10 (ref 5–15)
BUN: 33 mg/dL — ABNORMAL HIGH (ref 6–20)
CO2: 20 mmol/L — ABNORMAL LOW (ref 22–32)
Calcium: 7.8 mg/dL — ABNORMAL LOW (ref 8.9–10.3)
Chloride: 101 mmol/L (ref 98–111)
Creatinine, Ser: 1.85 mg/dL — ABNORMAL HIGH (ref 0.61–1.24)
GFR, Estimated: 44 mL/min — ABNORMAL LOW (ref >60)
Glucose, Bld: 343 mg/dL — ABNORMAL HIGH (ref 70–99)
Potassium: 4.8 mmol/L (ref 3.5–5.1)
Sodium: 131 mmol/L — ABNORMAL LOW (ref 135–145)
Total Bilirubin: 0.5 mg/dL (ref 0.3–1.2)
Total Protein: 6.6 g/dL (ref 6.5–8.1)

## 2021-01-07 LAB — LACTIC ACID, PLASMA
Lactic Acid, Venous: 1.6 mmol/L (ref 0.5–1.9)
Lactic Acid, Venous: 1.3 mmol/L (ref 0.5–1.9)

## 2021-01-07 LAB — TSH: TSH: 3.793 u[IU]/mL (ref 0.350–4.500)

## 2021-01-07 LAB — C-REACTIVE PROTEIN: CRP: 30.6 mg/dL — ABNORMAL HIGH (ref <1.0)

## 2021-01-07 LAB — RESP PANEL BY RT-PCR (FLU A&B, COVID) ARPGX2
Influenza A by PCR: NEGATIVE
Influenza B by PCR: NEGATIVE
SARS Coronavirus 2 by RT PCR: NEGATIVE

## 2021-01-07 LAB — GLUCOSE, CAPILLARY: Glucose-Capillary: 243 mg/dL — ABNORMAL HIGH (ref 70–99)

## 2021-01-07 LAB — PROCALCITONIN: Procalcitonin: 0.54 ng/mL

## 2021-01-07 IMAGING — MR MR FOOT*R* W/O CM
5 series · 39 of 40 positions shown · non-contrast
Comparison: Right foot x-rays from same day.

CLINICAL DATA: Right foot pain and swelling.  Stepped on a nail.

EXAM:
MRI OF THE RIGHT FOREFOOT WITHOUT CONTRAST
TECHNIQUE: Multiplanar, multisequence MR imaging of the right forefoot was
performed. No intravenous contrast was administered.

[Series 4: T1 · coronal · right · 3.5mm · 0.51mm/px · 9 of 34 slices shown (1 of 2)]
[im 1/34]
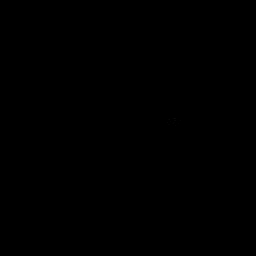
[im 4/34]
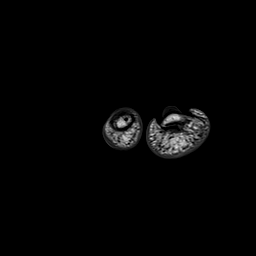
[im 8/34]
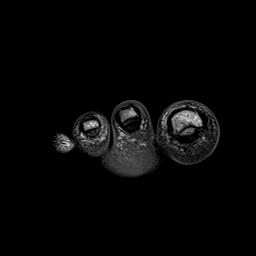
[im 12/34]
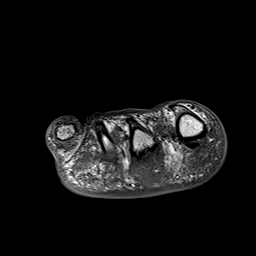
[im 15/34]
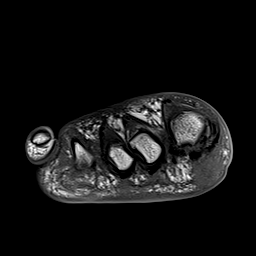
[im 19/34]
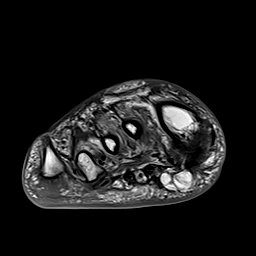
[im 23/34]
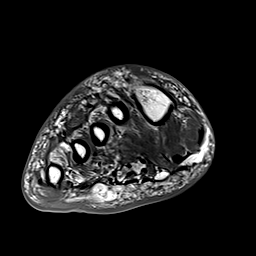
[im 30/34]
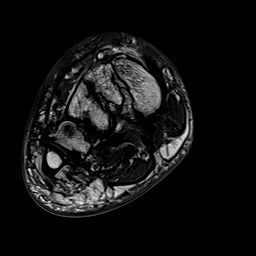
[im 34/34]
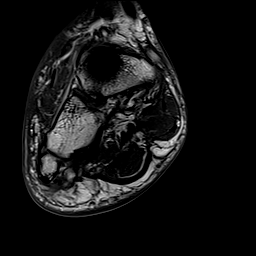

[Series 5: T2 fat-sat · coronal · right · 3.5mm · 0.41mm/px · 10 of 34 slices shown (1 of 2)]
[im 1/34]
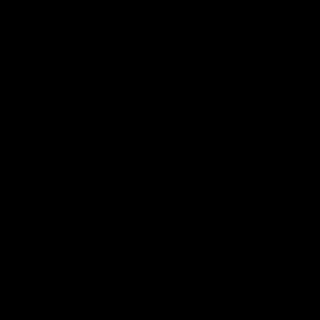
[im 4/34]
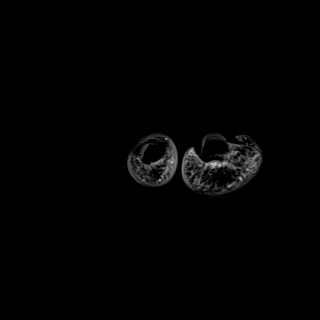
[im 8/34]
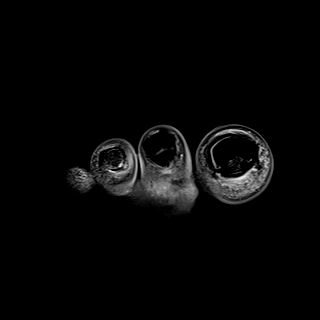
[im 12/34]
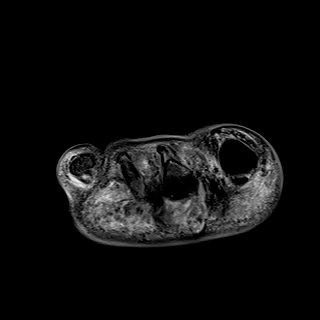
[im 15/34]
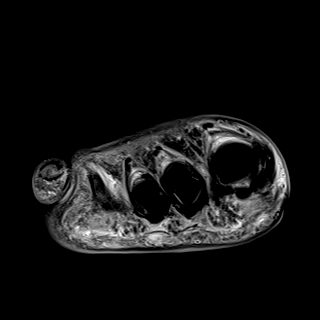
[im 19/34]
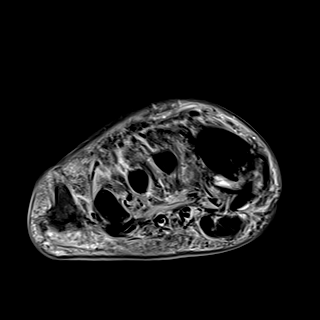
[im 23/34]
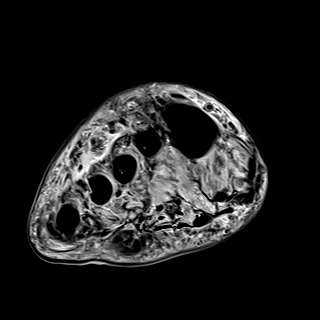
[im 26/34]
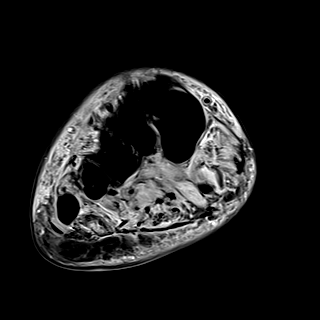
[im 30/34]
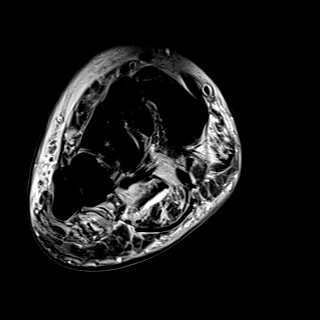
[im 34/34]
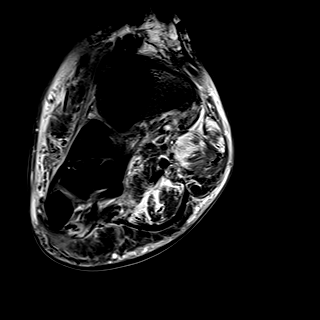

[Series 6: T2 fat-sat · axial · right · 3.3mm · 0.70mm/px · z∈[-134,-55]mm · 6 of 22 slices shown (2 of 2)]
[im 1/22]
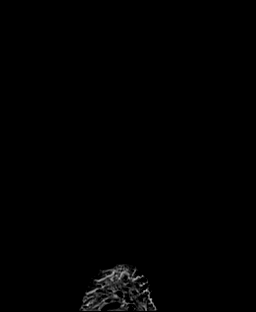
[im 5/22]
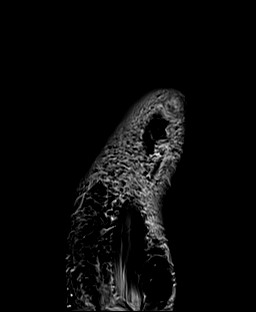
[im 9/22]
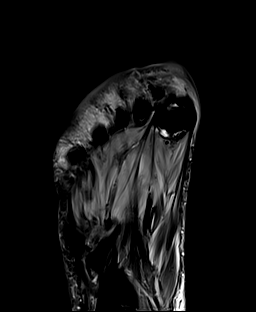
[im 13/22]
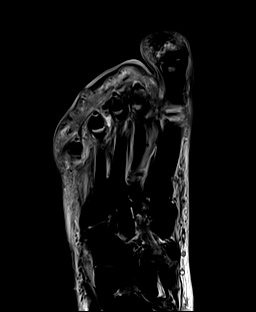
[im 17/22]
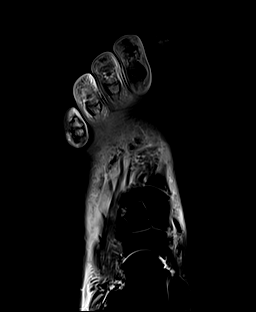
[im 22/22]
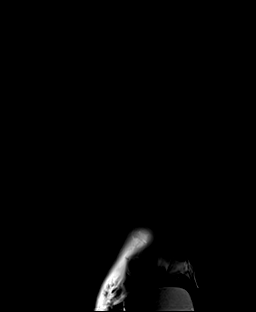

[Series 7: T1 · axial · right · 3.5mm · 0.78mm/px · z∈[-144,-60]mm · 6 of 22 slices shown (2 of 2)]
[im 1/22]
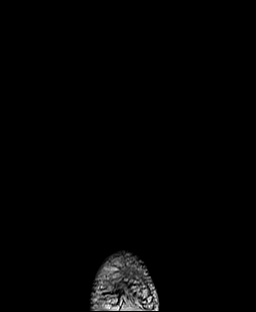
[im 5/22]
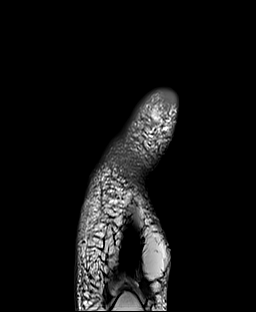
[im 9/22]
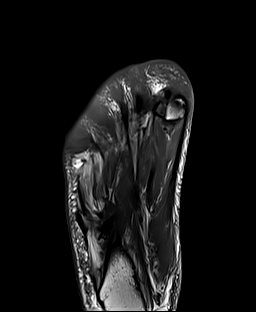
[im 13/22]
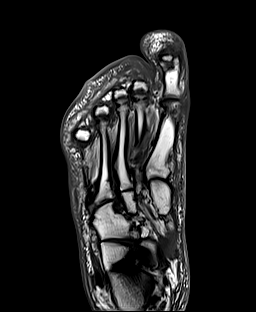
[im 17/22]
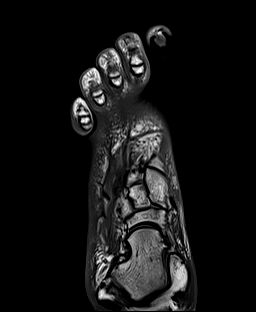
[im 22/22]
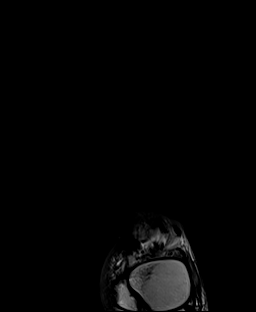

[Series 8: STIR · sagittal · right · 3.3mm · 0.39mm/px · 8 of 27 slices shown]
[im 1/27]
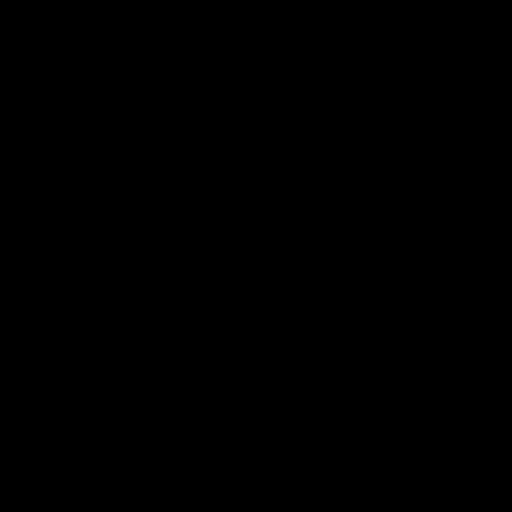
[im 4/27]
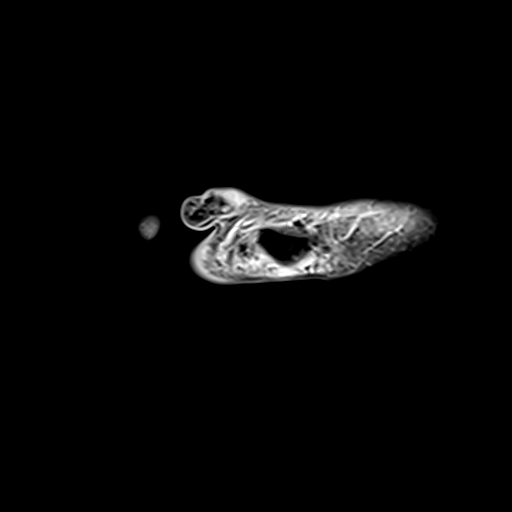
[im 8/27]
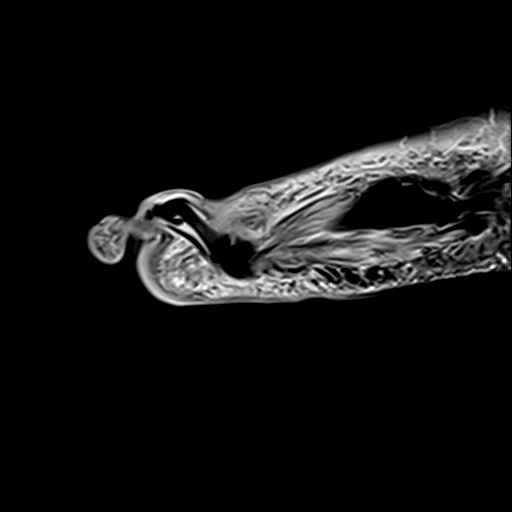
[im 12/27]
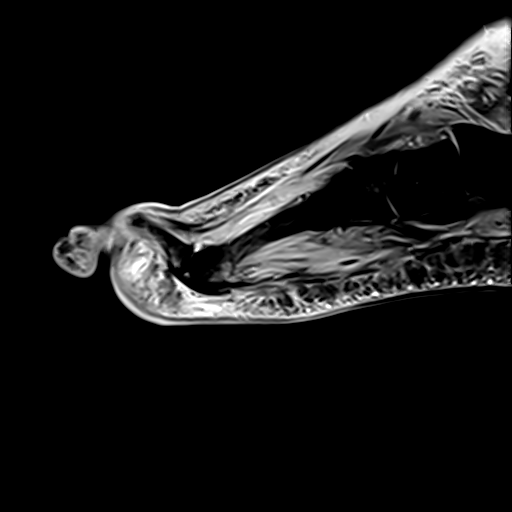
[im 15/27]
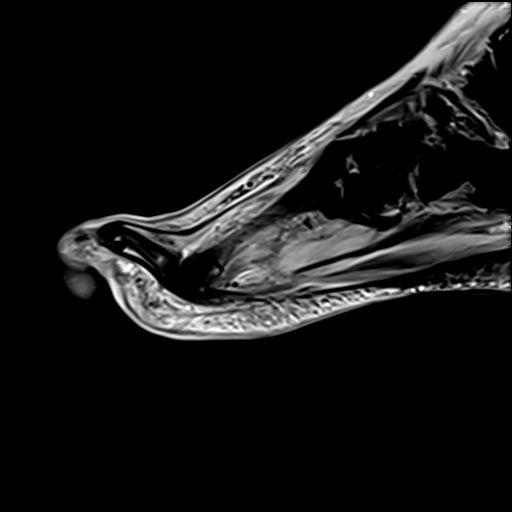
[im 19/27]
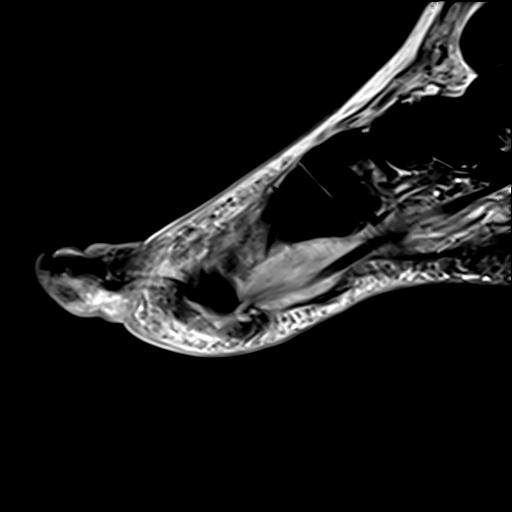
[im 23/27]
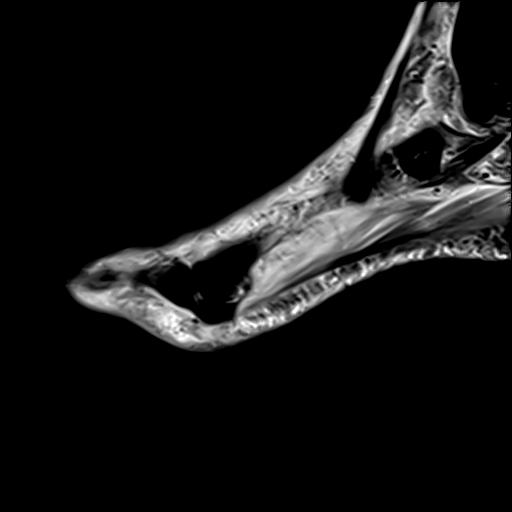
[im 27/27]
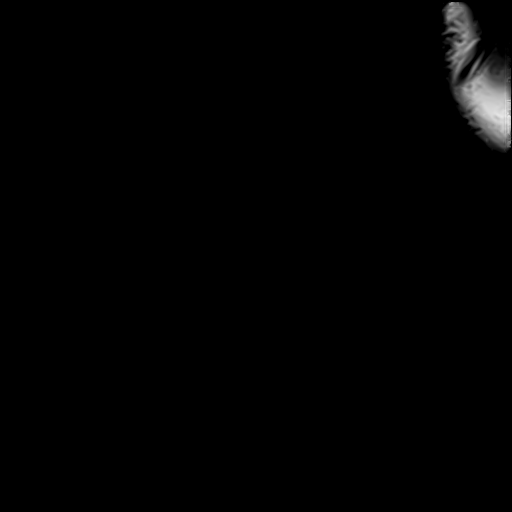

[39 of 40 positions shown; findings below may reference images not displayed]

FINDINGS: Bones/Joint/Cartilage

No marrow signal abnormality. No fracture or dislocation. Joint
spaces are preserved. No joint effusion.

Ligaments

Collateral ligaments are intact.  Lisfranc ligament is intact.

Muscles and Tendons
Flexor and extensor tendons are intact. No tenosynovitis. Increased
T2 signal within the intrinsic muscles of the forefoot, nonspecific,
but likely related to diabetic muscle changes.

Soft tissue
Severe diffuse soft tissue swelling of the right foot. Puncture
wound noted at the plantar base of the second proximal phalanx with
the regular 2.2 x 4.6 x 2.9 cm fluid collection surrounding the
second through fourth proximal phalanges (series 6, image 9; series
8, image 13). Smaller small 8 mm fluid collection at the lateral
plantar base of the fifth metatarsal head, likely an adventitial
bursa. No soft tissue mass.
IMPRESSION: 1. Puncture wound at the plantar base of the second proximal phalanx
with 2.2 x 4.6 x 2.9 cm abscess surrounding the second through
fourth proximal phalanges. No osteomyelitis.
2. Severe right foot cellulitis.

## 2021-01-07 IMAGING — CR DG FOOT COMPLETE 3+V*R*
3 series · 3 of 3 positions shown · non-contrast
Comparison: [DATE]

CLINICAL DATA: Wound infection

EXAM:
RIGHT FOOT COMPLETE - 3+ VIEW

[x foot ap right]
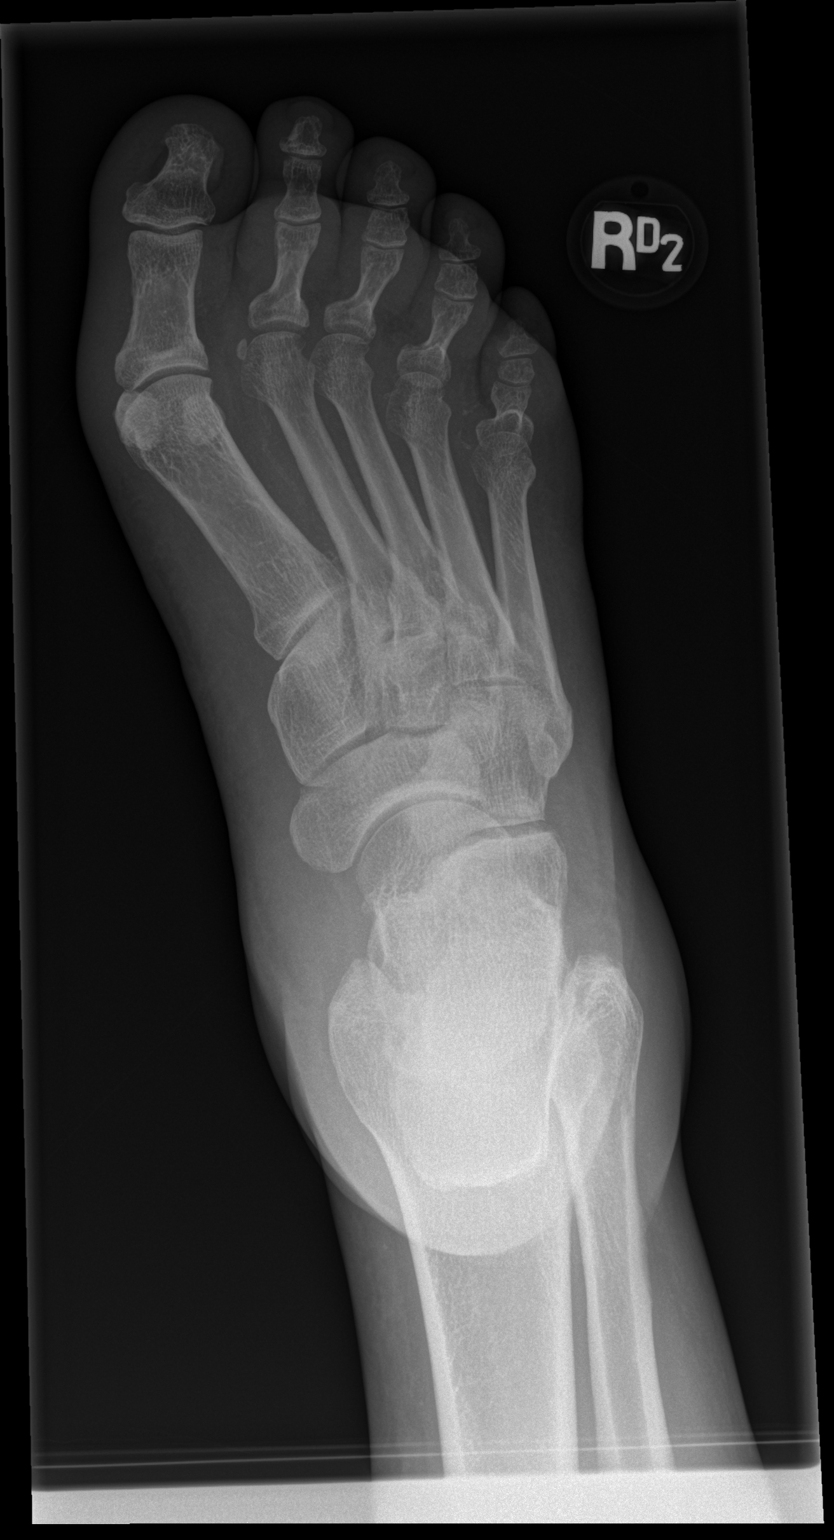

[x foot obl right]
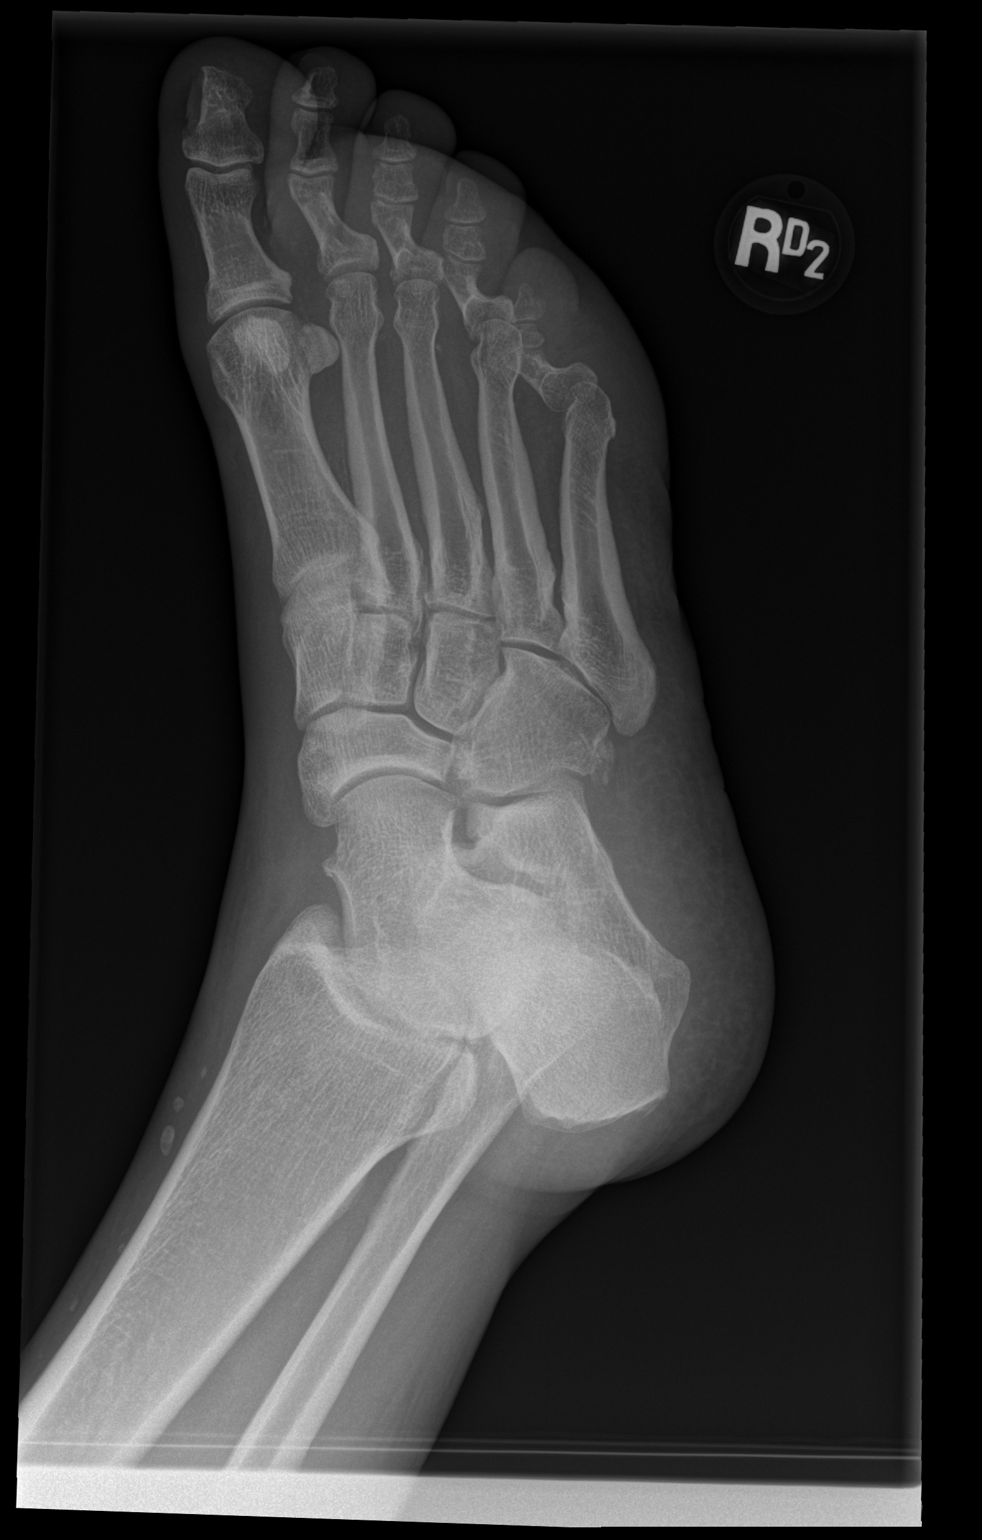

[x foot lat right]
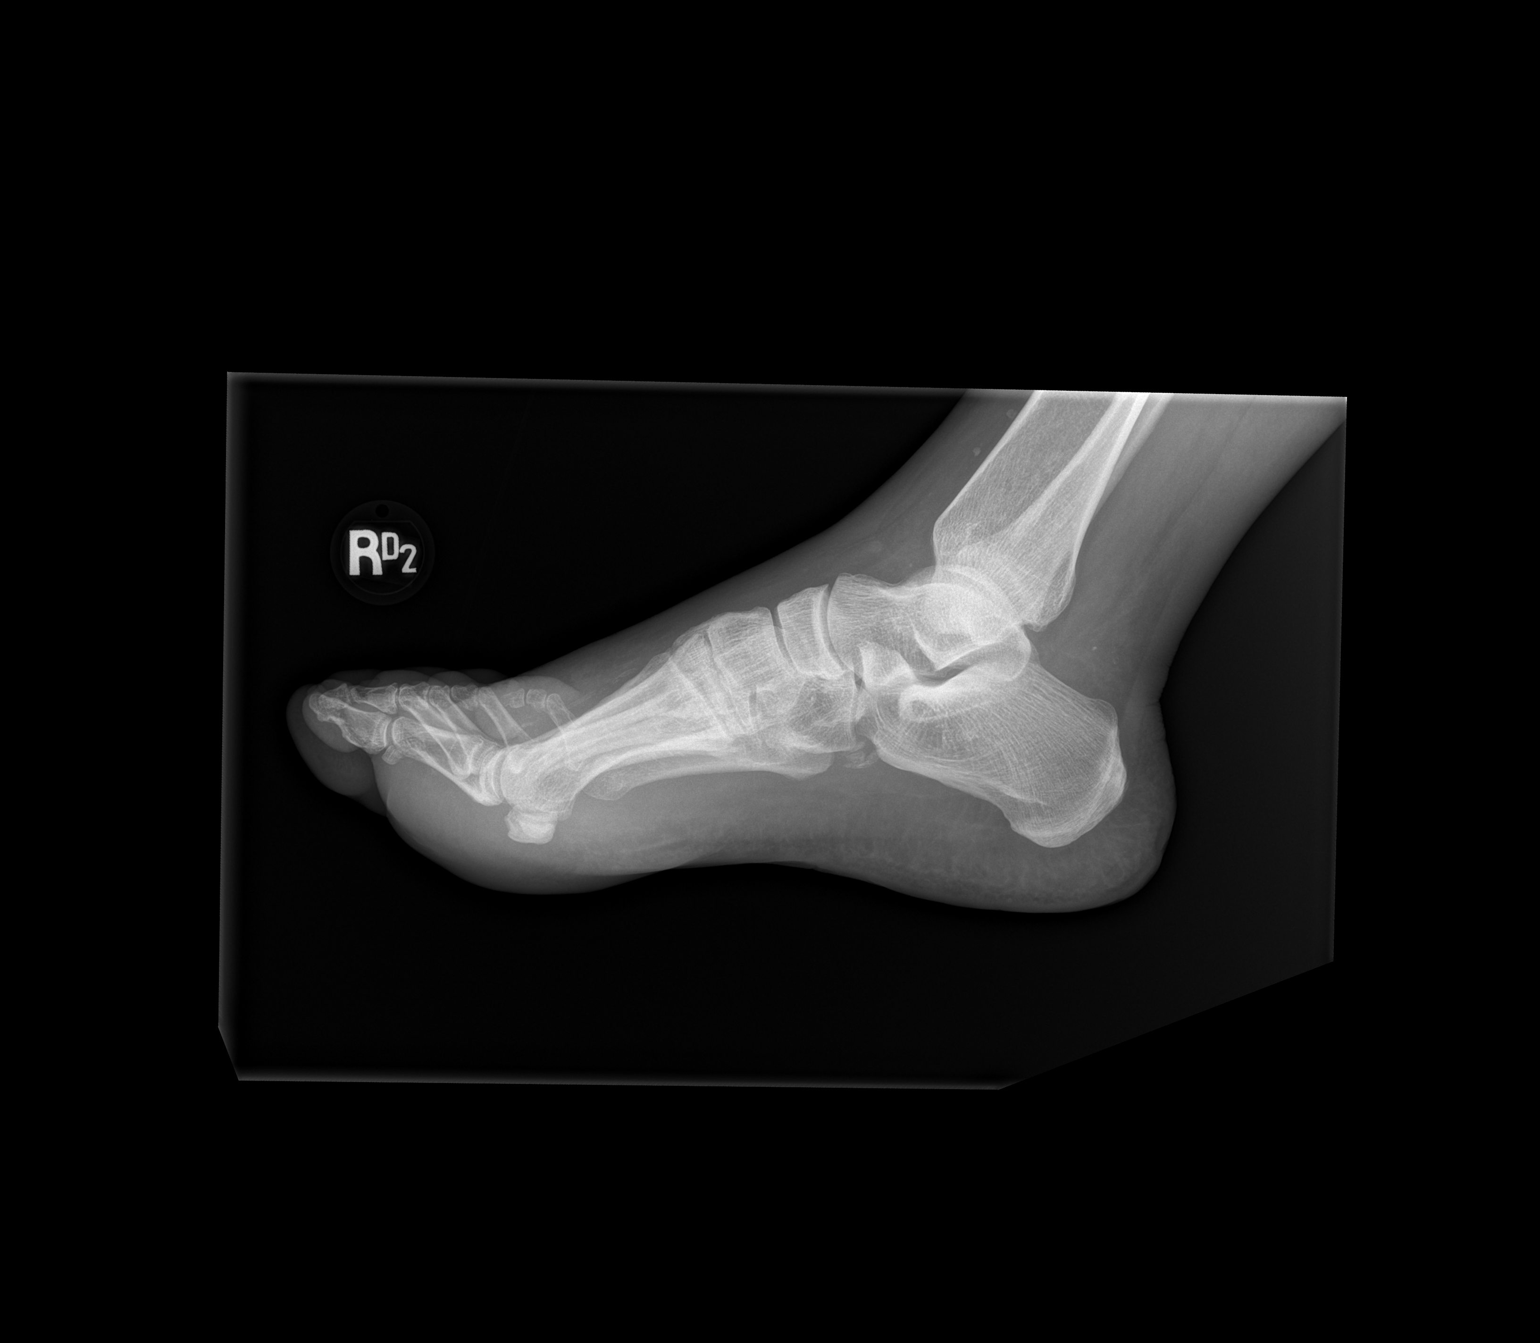

[3 of 3 positions shown; findings below may reference images not displayed]

FINDINGS: No fracture or malalignment. No radiopaque foreign body. Increased
plantar soft tissue swelling without emphysema. No periostitis or
osseous destructive change. There are vascular calcifications.
IMPRESSION: Increasing plantar soft tissue swelling. No acute osseous
abnormality.

## 2021-01-07 MED ORDER — ONDANSETRON HCL 4 MG/2ML IJ SOLN: 4.0000 mg | Freq: Four times a day (QID) | INTRAMUSCULAR | Status: DC | PRN

## 2021-01-07 MED ORDER — HEPARIN SODIUM (PORCINE) 5000 UNIT/ML IJ SOLN
5000.0000 [IU] | Freq: Three times a day (TID) | INTRAMUSCULAR | Status: DC
  Administered 2021-01-07 – 2021-01-11 (×8): 5000 [IU] via SUBCUTANEOUS
  Filled 2021-01-07 (×9): qty 1

## 2021-01-07 MED ORDER — SODIUM CHLORIDE 0.9 % IV SOLN: 2.0000 g | Freq: Once | INTRAVENOUS | Status: DC

## 2021-01-07 MED ORDER — SODIUM CHLORIDE 0.9 % IV SOLN: INTRAVENOUS | Status: DC

## 2021-01-07 MED ORDER — SODIUM CHLORIDE 0.9 % IV SOLN
2.0000 g | Freq: Two times a day (BID) | INTRAVENOUS | Status: DC
  Administered 2021-01-08: 2 g via INTRAVENOUS
  Filled 2021-01-07: qty 2

## 2021-01-07 MED ORDER — CEFEPIME HCL 2 G IJ SOLR
2.0000 g | Freq: Once | INTRAMUSCULAR | Status: AC
  Administered 2021-01-07: 2 g via INTRAVENOUS
  Filled 2021-01-07: qty 2

## 2021-01-07 MED ORDER — HYDRALAZINE HCL 20 MG/ML IJ SOLN: 10.0000 mg | Freq: Four times a day (QID) | INTRAMUSCULAR | Status: DC | PRN

## 2021-01-07 MED ORDER — SENNOSIDES-DOCUSATE SODIUM 8.6-50 MG PO TABS: 1.0000 | ORAL_TABLET | Freq: Every evening | ORAL | Status: DC | PRN

## 2021-01-07 MED ORDER — TRAMADOL HCL 50 MG PO TABS: 50.0000 mg | ORAL_TABLET | Freq: Four times a day (QID) | ORAL | Status: DC | PRN

## 2021-01-07 MED ORDER — ACETAMINOPHEN 650 MG RE SUPP: 650.0000 mg | Freq: Four times a day (QID) | RECTAL | Status: DC | PRN

## 2021-01-07 MED ORDER — INSULIN ASPART 100 UNIT/ML IJ SOLN
0.0000 [IU] | Freq: Three times a day (TID) | INTRAMUSCULAR | Status: DC
  Administered 2021-01-08: 17:00:00 3 [IU] via SUBCUTANEOUS
  Administered 2021-01-08 – 2021-01-09 (×4): 5 [IU] via SUBCUTANEOUS
  Administered 2021-01-10: 3 [IU] via SUBCUTANEOUS
  Administered 2021-01-10: 2 [IU] via SUBCUTANEOUS
  Administered 2021-01-10: 5 [IU] via SUBCUTANEOUS
  Administered 2021-01-11: 2 [IU] via SUBCUTANEOUS
  Administered 2021-01-11 (×2): 3 [IU] via SUBCUTANEOUS
  Filled 2021-01-07: qty 0.15

## 2021-01-07 MED ORDER — MORPHINE SULFATE (PF) 2 MG/ML IV SOLN: 2.0000 mg | INTRAVENOUS | Status: DC | PRN

## 2021-01-07 MED ORDER — DOCUSATE SODIUM 100 MG PO CAPS
100.0000 mg | ORAL_CAPSULE | Freq: Two times a day (BID) | ORAL | Status: DC
  Administered 2021-01-07 – 2021-01-11 (×6): 100 mg via ORAL
  Filled 2021-01-07 (×8): qty 1

## 2021-01-07 MED ORDER — INSULIN ASPART 100 UNIT/ML IJ SOLN
0.0000 [IU] | Freq: Every day | INTRAMUSCULAR | Status: DC
Start: 2021-01-07 — End: 2021-01-11
  Administered 2021-01-07: 21:00:00 2 [IU] via SUBCUTANEOUS
  Filled 2021-01-07: qty 0.05

## 2021-01-07 MED ORDER — VANCOMYCIN HCL IN DEXTROSE 1-5 GM/200ML-% IV SOLN: 1000.0000 mg | INTRAVENOUS | Status: DC

## 2021-01-07 MED ORDER — VANCOMYCIN HCL 2000 MG/400ML IV SOLN
2000.0000 mg | Freq: Once | INTRAVENOUS | Status: DC
  Filled 2021-01-07: qty 400

## 2021-01-07 MED ORDER — LACTATED RINGERS IV BOLUS
1000.0000 mL | Freq: Once | INTRAVENOUS | Status: AC
  Administered 2021-01-07: 1000 mL via INTRAVENOUS

## 2021-01-07 MED ORDER — VANCOMYCIN HCL 1000 MG/200ML IV SOLN: 1000.0000 mg | Freq: Once | INTRAVENOUS | Status: DC

## 2021-01-07 MED ORDER — VANCOMYCIN HCL 2000 MG/400ML IV SOLN
2000.0000 mg | INTRAVENOUS | Status: AC
  Administered 2021-01-07: 2000 mg via INTRAVENOUS
  Filled 2021-01-07: qty 400

## 2021-01-07 MED ORDER — ACETAMINOPHEN 325 MG PO TABS
650.0000 mg | ORAL_TABLET | Freq: Four times a day (QID) | ORAL | Status: DC | PRN
  Administered 2021-01-08 – 2021-01-10 (×2): 650 mg via ORAL
  Filled 2021-01-07 (×2): qty 2

## 2021-01-07 MED ORDER — ONDANSETRON HCL 4 MG PO TABS: 4.0000 mg | ORAL_TABLET | Freq: Four times a day (QID) | ORAL | Status: DC | PRN

## 2021-01-07 NOTE — ED Provider Notes (Signed)
[facility] DEPT Provider Note   CSN: [id number] Arrival date & time: 01/07/21  1554     History Chief Complaint  Patient presents with   Wound Infection    Richard Pittman is a 50 y.o. male with T2DM who presents emergency department for evaluation of a right foot infection.  Patient states that 2 weeks ago he stepped on a nail that penetrated the bottom sole of his work boot and caused a puncture wound to the plantar surface of the right foot.  He was seen at an urgent care at that time and prescribed Keflex.  Over the following 10 days his infection worsened and he was seen at an urgent care yesterday where he was prescribed doxycycline and discharged home with instructions to come to the emergency department if he has a fever.  Patient became febrile last night and arrives with significant right lower extremity swelling and erythema but denies any pain to the lower extremity, denies numbness, tingling, weakness, chest pain, shortness of breath, abdominal pain with nausea, vomiting or other systemic symptoms.  HPI     Past Medical History:  Diagnosis Date   Diabetes mellitus without complication ([place])     There are no problems to display for this patient.   History reviewed. No pertinent surgical history.     Family History  Problem Relation Age of Onset   Diabetes Mother     Social History   Tobacco Use   Smoking status: Never   Smokeless tobacco: Never  Vaping Use   Vaping Use: Never used  Substance Use Topics   Alcohol use: Not Currently   Drug use: Never    Home Medications Prior to Admission medications   Medication Sig Start Date End Date Taking? Authorizing Provider  doxycycline (VIBRAMYCIN) 100 MG capsule Take 1 capsule (100 mg total) by mouth 2 (two) times daily. 01/06/21   Jaynee Eagles, PA-C  glipiZIDE (GLUCOTROL) 5 MG tablet Take by mouth 2 (two) times daily before a meal.    [provider]  metFORMIN  (GLUMETZA) 1000 MG (MOD) 24 hr tablet Take 1,000 mg by mouth 2 (two) times daily with a meal.    [provider]  naproxen (NAPROSYN) 500 MG tablet Take 1 tablet (500 mg total) by mouth 2 (two) times daily with a meal. 01/06/21   Jaynee Eagles, PA-C  traMADol (ULTRAM) 50 MG tablet Take 1 tablet (50 mg total) by mouth every 6 (six) hours as needed. 07/17/17   Carlisle Cater, PA-C    Allergies    Patient has no known allergies.  Review of Systems   Review of Systems  Constitutional:  Negative for chills and fever.  HENT:  Negative for ear pain and sore throat.   Eyes:  Negative for pain and visual disturbance.  Respiratory:  Negative for cough and shortness of breath.   Cardiovascular:  Negative for chest pain and palpitations.  Gastrointestinal:  Negative for abdominal pain and vomiting.  Genitourinary:  Negative for dysuria and hematuria.  Musculoskeletal:  Negative for arthralgias and back pain.  Skin:  Positive for wound. Negative for color change and rash.  Neurological:  Negative for seizures and syncope.  All other systems reviewed and are negative.  Physical Exam Updated Vital Signs BP (!) 103/57   Pulse 82   Temp 98.4 F (36.9 C) (Oral)   Resp 18   Ht 5\' 8"  (1.727 m)   Wt 99.8 kg   SpO2 100%   BMI  33.45 kg/m   Physical Exam Vitals and nursing note reviewed.  Constitutional:      General: He is not in acute distress.    Appearance: He is well-developed.  HENT:     Head: Normocephalic and atraumatic.  Eyes:     Conjunctiva/sclera: Conjunctivae normal.  Cardiovascular:     Rate and Rhythm: Normal rate and regular rhythm.     Heart sounds: No murmur heard. Pulmonary:     Effort: Pulmonary effort is normal. No respiratory distress.     Breath sounds: Normal breath sounds.  Abdominal:     Palpations: Abdomen is soft.     Tenderness: There is no abdominal tenderness.  Musculoskeletal:        General: Swelling present. No tenderness.     Cervical back: Neck  supple.  Skin:    General: Skin is warm and dry.     Capillary Refill: Capillary refill takes less than 2 seconds.     Findings: Erythema (Right foot, worse at second digit) present.  Neurological:     Mental Status: He is alert.  Psychiatric:        Mood and Affect: Mood normal.    ED Results / Procedures / Treatments   Labs (all labs ordered are listed, but only abnormal results are displayed) Labs Reviewed  CBC WITH DIFFERENTIAL/PLATELET - Abnormal; Notable for the following components:      Result Value   WBC 14.7 (*)    RBC 4.09 (*)    Hemoglobin 10.6 (*)    HCT 31.9 (*)    MCV 78.0 (*)    MCH 25.9 (*)    Neutro Abs 12.6 (*)    Abs Immature Granulocytes 0.12 (*)    All other components within normal limits  COMPREHENSIVE METABOLIC PANEL  SEDIMENTATION RATE  C-REACTIVE PROTEIN    EKG None  Radiology DG Foot Complete Right  Result Date: 01/07/2021 CLINICAL DATA:  Wound infection EXAM: RIGHT FOOT COMPLETE - 3+ VIEW COMPARISON:  01/06/2021 FINDINGS: No fracture or malalignment. No radiopaque foreign body. Increased plantar soft tissue swelling without emphysema. No periostitis or osseous destructive change. There are vascular calcifications. IMPRESSION: Increasing plantar soft tissue swelling. No acute osseous abnormality. Electronically Signed   By: Jasmine Pang M.D.   On: 01/07/2021 16:56   DG Foot Complete Right  Result Date: 01/06/2021 CLINICAL DATA:  Right foot pain.  History of puncture wound EXAM: RIGHT FOOT COMPLETE - 3+ VIEW COMPARISON:  None. FINDINGS: There is no evidence of fracture or dislocation. Joint spaces are relatively preserved. No erosion or periosteal elevation is evident. Soft tissue swelling the plantar aspect of the foot in the region of the distal forefoot. No soft tissue gas or radiopaque foreign body evident. Vascular calcifications are present. IMPRESSION: 1. Soft tissue swelling of the plantar aspect of the foot. 2. No acute osseous  abnormality.  No radiopaque foreign body. Electronically Signed   By: Duanne Guess D.O.   On: 01/06/2021 12:25    Procedures Procedures   Medications Ordered in ED Medications - No data to display  ED Course  I have reviewed the triage vital signs and the nursing notes.  Pertinent labs & imaging results that were available during my care of the patient were reviewed by me and considered in my medical decision making (see chart for details).    MDM Rules/Calculators/A&P  Patient seen emergency department for evaluation of a right foot infection.  Physical exam reveals significant swelling and erythema at the right foot and ankle but no tenderness to palpation.  Patient is full range of motion of the toes and the ankle.  X-ray with no evidence of osteomyelitis but does show progressive swelling.  As patient likely should have been covered for Pseudomonas based on the mechanism of injury initially, there is not overall surprising that his symptoms were not improved with Keflex.  We will cover for Pseudomonas today with cefepime.  Patient will require admission for cellulitis versus osteomyelitis. Final Clinical Impression(s) / ED Diagnoses Final diagnoses:  None    Rx / DC Orders ED Discharge Orders     None        Naesha Buckalew, Debe Coder, MD 01/07/21 [phone]

## 2021-01-07 NOTE — Progress Notes (Signed)
A consult was received from an ED physician for vancomycin and cefepime per pharmacy dosing.  The patient's profile has been reviewed for ht/wt/allergies/indication/available labs.   A one time order has been placed for cefepime 2 g and vancomycin 2000 mg.  Further antibiotics/pharmacy consults should be ordered by admitting physician if indicated.                       Thank you, Valentina Gu 01/07/2021  5:38 PM

## 2021-01-07 NOTE — H&P (Signed)
History and Physical    Richard Pittman [id number] DOB: [birth year] DOA: 01/07/2021  PCP: System, Provider Not In   Patient coming from: Home  Chief Complaint: Right Foot Pain   HPI: Richard Pittman is a 50 y.o. male with medical history significant for but not limited to diabetes mellitus type 2, obesity as well as other comorbidities who presented to the emergency room with a chief complaint of right foot pain and drainage.  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  His foot started swelling and became infected so he went to see his PCP who put him on Keflex.  Since then he has an increased level of drainage and he so he went to urgent care yesterday and was changed to doxycycline.  He started spiking fevers and chills yesterday but denied chest pain or shortness breath.  He is diabetic.  Because of his fever he came to the ED with significant right lower extremity swelling and erythema.  He states that he does have some pain but denies numbness, tingling, weakness.  No abdominal pain, nausea, vomiting.  [facility] was asked admit this patient for right diabetic foot infection in the setting of nail puncture and an MRI was requested as well as podiatry evaluation.  ED Course: In the ED had basic blood work done and had an x-ray of the foot and was started on Vanco and cefepime and also given 1 L normal symbols.  Blood cultures were ordered as well as COVID testing which is pending  Review of Systems: As per HPI otherwise all other systems reviewed and negative.   Past Medical History:  Diagnosis Date   Diabetes mellitus without complication ([place])    SURGICAL HISTORY -Patient has had Eye Surgery   SOCIAL HISTORY  reports that he has never smoked. He has never used smokeless tobacco. He reports that he does not currently use alcohol. He reports that he does not use  drugs.  ALLERGIES No Known Allergies  Family History  Problem Relation Age of Onset   Diabetes Mother    Prior to Admission medications   Medication Sig Start Date End Date Taking? Authorizing Provider  doxycycline (VIBRAMYCIN) 100 MG capsule Take 1 capsule (100 mg total) by mouth 2 (two) times daily. 01/06/21   Jaynee Eagles, PA-C  glipiZIDE (GLUCOTROL) 5 MG tablet Take by mouth 2 (two) times daily before a meal.    [provider]  metFORMIN (GLUMETZA) 1000 MG (MOD) 24 hr tablet Take 1,000 mg by mouth 2 (two) times daily with a meal.    [provider]  naproxen (NAPROSYN) 500 MG tablet Take 1 tablet (500 mg total) by mouth 2 (two) times daily with a meal. 01/06/21   Jaynee Eagles, PA-C  traMADol (ULTRAM) 50 MG tablet Take 1 tablet (50 mg total) by mouth every 6 (six) hours as needed. 07/17/17   Carlisle Cater, PA-C   Physical Exam: Vitals:   01/07/21 1730 01/07/21 1800 01/07/21 1830 01/07/21 1900  BP: 117/63 121/64 112/63 133/66  Pulse: 82 82 82 85  Resp:    16  Temp:      TempSrc:      SpO2: 100% 99% 97% 100%  Weight:      Height:       Constitutional: WN/WD obese Hispanic male currently in NAD and appears calm but a little uncomfortable Eyes: Lids and conjunctivae  normal, sclerae anicteric  ENMT: External Ears, Nose appear normal. Grossly normal hearing. Neck: Appears normal, supple, no cervical masses, normal ROM, no appreciable thyromegaly; no JVD Respiratory: Diminished to auscultation bilaterally, no wheezing, rales, rhonchi or crackles. Normal respiratory effort and patient is not tachypenic. No accessory muscle use. Unlabored breathing  Cardiovascular: RRR, no murmurs / rubs / gallops. S1 and S2 auscultated. Right LE edema   Abdomen: Soft, non-tender, Distended 2/2 to body habitus. Bowel sounds positive.  GU: Deferred. Musculoskeletal: No clubbing / cyanosis of digits/nails. No joint deformity upper and lower extremities. Skin: Right Foot Swelling and Pain  on palpation. Has some induration and Erythema  Neurologic: CN 2-12 grossly intact with no focal deficits. Romberg sign and cerebellar reflexes not assessed.  Psychiatric: Normal judgment and insight. Alert and oriented x 3. Normal mood and appropriate affect.   Labs on Admission: I have personally reviewed following labs and imaging studies  CBC: Recent Labs  Lab 01/06/21 1215 01/07/21 1700  WBC 14.1* 14.7*  NEUTROABS 12.1* 12.6*  HGB 11.6* 10.6*  HCT 35.8* 31.9*  MCV 79.0* 78.0*  PLT 257 371   Basic Metabolic Panel: Recent Labs  Lab 01/06/21 1215 01/07/21 1700  NA 131* 131*  K 5.1 4.8  CL 99 101  CO2 23 20*  GLUCOSE 402* 343*  BUN 19 33*  CREATININE 1.49* 1.85*  CALCIUM 8.5* 7.8*   GFR: Estimated Creatinine Clearance: 54.1 mL/min (A) (by C-G formula based on SCr of 1.85 mg/dL (H)). Liver Function Tests: Recent Labs  Lab 01/06/21 1215 01/07/21 1700  AST 15 12*  ALT 17 15  ALKPHOS 91 82  BILITOT 0.6 0.5  PROT 6.6 6.6  ALBUMIN 2.9* 3.0*   No results for input(s): LIPASE, AMYLASE in the last 168 hours. No results for input(s): AMMONIA in the last 168 hours. Coagulation Profile: No results for input(s): INR, PROTIME in the last 168 hours. Cardiac Enzymes: No results for input(s): CKTOTAL, CKMB, CKMBINDEX, TROPONINI in the last 168 hours. BNP (last 3 results) No results for input(s): PROBNP in the last 8760 hours. HbA1C: No results for input(s): HGBA1C in the last 72 hours. CBG: No results for input(s): GLUCAP in the last 168 hours. Lipid Profile: No results for input(s): CHOL, HDL, LDLCALC, TRIG, CHOLHDL, LDLDIRECT in the last 72 hours. Thyroid Function Tests: No results for input(s): TSH, T4TOTAL, FREET4, T3FREE, THYROIDAB in the last 72 hours. Anemia Panel: No results for input(s): VITAMINB12, FOLATE, FERRITIN, TIBC, IRON, RETICCTPCT in the last 72 hours. Urine analysis:    Component Value Date/Time   COLORURINE YELLOW 07/17/2017 1259   APPEARANCEUR  CLEAR 07/17/2017 1259   LABSPEC 1.032 (H) 07/17/2017 1259   PHURINE 5.0 07/17/2017 1259   GLUCOSEU >=500 (A) 07/17/2017 1259   HGBUR SMALL (A) 07/17/2017 1259   BILIRUBINUR NEGATIVE 07/17/2017 1259   KETONESUR 20 (A) 07/17/2017 1259   PROTEINUR >=300 (A) 07/17/2017 1259   NITRITE NEGATIVE 07/17/2017 1259   LEUKOCYTESUR NEGATIVE 07/17/2017 1259   Sepsis Labs: !!!!!!!!!!!!!!!!!!!!!!!!!!!!!!!!!!!!!!!!!!!! @LABRCNTIP (procalcitonin:4,lacticidven:4) ) Recent Results (from the past 240 hour(s))  Resp Panel by RT-PCR (Flu A&B, Covid) Nasopharyngeal Swab     Status: None   Collection Time: 01/07/21  5:50 PM   Specimen: Nasopharyngeal Swab; Nasopharyngeal(NP) swabs in vial transport medium  Result Value Ref Range Status   SARS Coronavirus 2 by RT PCR NEGATIVE NEGATIVE Final    Comment: (NOTE) SARS-CoV-2 target nucleic acids are NOT DETECTED.  The SARS-CoV-2 RNA is generally detectable in upper respiratory specimens during  the acute phase of infection. The lowest concentration of SARS-CoV-2 viral copies this assay can detect is 138 copies/mL. A negative result does not preclude SARS-Cov-2 infection and should not be used as the sole basis for treatment or other patient management decisions. A negative result may occur with  improper specimen collection/handling, submission of specimen other than nasopharyngeal swab, presence of viral mutation(s) within the areas targeted by this assay, and inadequate number of viral copies(<138 copies/mL). A negative result must be combined with clinical observations, patient history, and epidemiological information. The expected result is Negative.  Fact Sheet for Patients:  EntrepreneurPulse.com.au  Fact Sheet for Healthcare Providers:  IncredibleEmployment.be  This test is no t yet approved or cleared by the [place] FDA and  has been authorized for detection and/or diagnosis of SARS-CoV-2 by FDA under an  Emergency Use Authorization (EUA). This EUA will remain  in effect (meaning this test can be used) for the duration of the COVID-19 declaration under Section 564(b)(1) of the Act, 21 U.S.C.section 360bbb-3(b)(1), unless the authorization is terminated  or revoked sooner.       Influenza A by PCR NEGATIVE NEGATIVE Final   Influenza B by PCR NEGATIVE NEGATIVE Final    Comment: (NOTE) The Xpert Xpress SARS-CoV-2/FLU/RSV plus assay is intended as an aid in the diagnosis of influenza from Nasopharyngeal swab specimens and should not be used as a sole basis for treatment. Nasal washings and aspirates are unacceptable for Xpert Xpress SARS-CoV-2/FLU/RSV testing.  Fact Sheet for Patients: EntrepreneurPulse.com.au  Fact Sheet for Healthcare Providers: IncredibleEmployment.be  This test is not yet approved or cleared by the [place] FDA and has been authorized for detection and/or diagnosis of SARS-CoV-2 by FDA under an Emergency Use Authorization (EUA). This EUA will remain in effect (meaning this test can be used) for the duration of the COVID-19 declaration under Section 564(b)(1) of the Act, 21 U.S.C. section 360bbb-3(b)(1), unless the authorization is terminated or revoked.  Performed at [facility], [place] [address], [place], [place] [address]      Radiological Exams on Admission: DG Foot Complete Right  Result Date: 01/07/2021 CLINICAL DATA:  Wound infection EXAM: RIGHT FOOT COMPLETE - 3+ VIEW COMPARISON:  01/06/2021 FINDINGS: No fracture or malalignment. No radiopaque foreign body. Increased plantar soft tissue swelling without emphysema. No periostitis or osseous destructive change. There are vascular calcifications. IMPRESSION: Increasing plantar soft tissue swelling. No acute osseous abnormality. Electronically Signed   By: Donavan Foil M.D.   On: 01/07/2021 16:56   DG Foot Complete Right  Result Date:  01/06/2021 CLINICAL DATA:  Right foot pain.  History of puncture wound EXAM: RIGHT FOOT COMPLETE - 3+ VIEW COMPARISON:  None. FINDINGS: There is no evidence of fracture or dislocation. Joint spaces are relatively preserved. No erosion or periosteal elevation is evident. Soft tissue swelling the plantar aspect of the foot in the region of the distal forefoot. No soft tissue gas or radiopaque foreign body evident. Vascular calcifications are present. IMPRESSION: 1. Soft tissue swelling of the plantar aspect of the foot. 2. No acute osseous abnormality.  No radiopaque foreign body. Electronically Signed   By: Davina Poke D.O.   On: 01/06/2021 12:25    EKG: No EKG done on admission so will order one now.   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 -He went to go see his primary care doctor who put him on Keflex and then he went to urgent care and was placed on doxycycline -Failed outpatient treatment but likely did not have adequate antibiotic treatment -Not septic on admission but he did meet SIRS criteria with a temperature of 100.9 and a WBC of 14.7 -WBC went from 14.1 -> 14.7 -LA was 1.6; PCT was 0.54 -Patient had a decent Pedal Pulse but had Leg swelling  -Check blood cultures x2 -ESR was 92 with CRP pending  -DG foot showed "No fracture or malalignment. No radiopaque foreign body. Increased plantar soft tissue swelling without emphysema. No periostitis or osseous destructive change. There are vascular calcifications." -Continue with empiric antibiotics with IV vancomycin and cefepime given diabetic foot  infection -MRI of the foot ordered and pending to be done -Consult podiatry for further evaluation recommendations and EDP has placed a Consult Request   Uncontrolled Diabetes Mellitus Type 2 with Hyperglycemia -Patient reports that his last hemoglobin A1c was 8.5 -Check hemoglobin A1c in the morning -Patient's blood sugar yesterday was 402 and this afternoon it was 343 on CMP -Hold home Glipizide and Metformin -Place on Moderate Novolog SSI AC/HS and may need Long Acting Coverage  AKI Metabolic Acidosis -Patient's BUNs/creatinine went from 19/1.49 and trended up to 33/1.85; unclear baseline -Patient has a metabolic acidosis with a CO2 of 20, anion gap of 10, chloride level of 101 -Given a 1 L lactated Ringer's bolus and then started on maintenance IV fluids with normal saline at 75 MLS per hour -Avoid further nephrotoxic medications, contrast dyes, hypotension and dehydration and renally adjust medications -Repeat CMP in the a.m.  Hyponatremia -Mild. Na+ was 131 -Give IV fluid hydration as above -Continue monitor and trend and repeat CMP in a.m.  Microcytic Anemia -Patient's Hgb/Hct went from 11.6/35.8 -> 10.6/31.9 -Check Anemia Panel in the AM -Continue to Monitor for S/Sx of Bleeding; no overt bleeding noted -Repeat CBC in the AM   Obesity -Complicates overall prognosis and care -Estimated body mass index is 33.45 kg/m as calculated from the following:   Height as of this encounter: 5' 8"  (1.727 m).   Weight as of this encounter: 99.8 kg. -Weight Loss and Dietary Counseling given  DVT prophylaxis: Heparin 5000 units subcu every 8h Code Status: FULL CODE  Family Communication: Discussed with wife and son at bedside Disposition Plan: Pending further clinical improvement and evaluation by podiatry Consults called: EDP is calling podiatry Admission status: Inpatient Telemetry  Severity of Illness: The appropriate patient status for this patient is INPATIENT. Inpatient  status is judged to be reasonable and necessary in order to provide the required intensity of service to ensure the patient's safety. The patient's presenting symptoms, physical exam findings, and initial radiographic and laboratory data in the context of their chronic comorbidities is felt to place them at high risk for further clinical deterioration. Furthermore, it is not anticipated that the patient will be medically stable for discharge from the hospital within 2 midnights of admission.   * I certify that at the point of admission it is my clinical judgment that the patient will require inpatient hospital care spanning beyond 2 midnights from the point of admission due to high intensity of service, high risk for further  deterioration and high frequency of surveillance required.Kerney Elbe, D.O. [facility] is on [place]  If 7PM-7AM, please contact night-coverage [email or web address]  01/07/2021, 7:28 PM

## 2021-01-07 NOTE — ED Triage Notes (Signed)
Pt has a wound infection to his right right foot after stepping on a nail 2 weeks ago. Pt states he was given antibiotics but the wound has gotten worse. Reports fevers at home. Pt is a diabetic.

## 2021-01-07 NOTE — ED Provider Notes (Signed)
Emergency Medicine Provider Triage Evaluation Note  Richard Pittman , a 50 y.o. male  was evaluated in triage.  Pt complains of a progressing right foot wound.  Patient states that 2 weeks ago he was walking at work he stepped on a nail which went through his rubber work boot and cut him on the foot.  Since then he has been having increased level of drainage.  He was seen evaluated at his primary care doctor who placed him on cephalexin.  This offered no improvement.  Was then placed on doxycycline.  Started spiking fevers and chills yesterday.  No chest pain or shortness of breath.  He has diabetic  Review of Systems  Positive:  Negative: See above   Physical Exam  BP 122/72 (BP Location: Left Arm)   Pulse 89   Temp 98.4 F (36.9 C) (Oral)   Resp 18   SpO2 96%  Gen:   Awake, no distress   Resp:  Normal effort  MSK:   Moves extremities without difficulty  Other:  2+ dorsalis pedis pulse felt on the right.  Plantar aspect of the foot there is a puncture wound that is weeping.  Entire right foot is edematous and erythematous.  Medical Decision Making  Medically screening exam initiated at 4:22 PM.  Appropriate orders placed.  Richard Pittman was informed that the remainder of the evaluation will be completed by another provider, this initial triage assessment does not replace that evaluation, and the importance of remaining in the ED until their evaluation is complete.  Likely IV antibiotics for Pseudomonas coverage.   Honor Loh Floris, PA-C 01/07/21 1623    Mancel Bale, MD 01/08/21 1330

## 2021-01-07 NOTE — Progress Notes (Signed)
Pharmacy Antibiotic Note  Richard Pittman is a 50 y.o. male with a h/o DM admitted on 01/07/2021 with right foot wound.  Pharmacy has been consulted for vancomycin a dosing.  11/22 foot XR: no acute osseous abnormality  Plan: Vancomycin 2000 mg iv once followed by 1000 mg IV Q 24 hrs. Goal AUC 400-550. Expected AUC: 473 SCr used: 1.85  Cefepime 2 g iv q 12 hours  Will f/u renal function, culture results, and clinical course Levels if/when indicated   Height: 5\' 8"  (172.7 cm) Weight: 99.8 kg (220 lb) IBW/kg (Calculated) : 68.4  Temp (24hrs), Avg:98.4 F (36.9 C), Min:98.4 F (36.9 C), Max:98.4 F (36.9 C)  Recent Labs  Lab 01/06/21 1215 01/07/21 1700  WBC 14.1* 14.7*  CREATININE 1.49* 1.85*    Estimated Creatinine Clearance: 54.1 mL/min (A) (by C-G formula based on SCr of 1.85 mg/dL (H)).    No Known Allergies  Antimicrobials this admission: 11/22 vancomycin >>  11/22 cefepime >>   Dose adjustments this admission:  Microbiology results: 11/22 BCx:   Thank you for allowing pharmacy to be a part of this patient's care.  12/22 D 01/07/2021 7:04 PM

## 2021-01-08 ENCOUNTER — Inpatient Hospital Stay (HOSPITAL_COMMUNITY): Payer: Self-pay | Admitting: Certified Registered Nurse Anesthetist

## 2021-01-08 ENCOUNTER — Other Ambulatory Visit: Payer: Self-pay

## 2021-01-08 ENCOUNTER — Ambulatory Visit: Payer: Self-pay | Admitting: Podiatry

## 2021-01-08 ENCOUNTER — Encounter (HOSPITAL_COMMUNITY): Payer: Self-pay | Admitting: Internal Medicine

## 2021-01-08 ENCOUNTER — Encounter (HOSPITAL_COMMUNITY)
Admission: EM | Disposition: A | Discharge status: Home / Self Care | Payer: Self-pay | Source: Home / Self Care | Attending: Obstetrics and Gynecology

## 2021-01-08 DIAGNOSIS — L089 Local infection of the skin and subcutaneous tissue, unspecified: Secondary | ICD-10-CM

## 2021-01-08 HISTORY — PX: INCISION AND DRAINAGE: SHX5863

## 2021-01-08 LAB — RAPID URINE DRUG SCREEN, HOSP PERFORMED
Amphetamines: NOT DETECTED
Barbiturates: NOT DETECTED
Benzodiazepines: NOT DETECTED
Cocaine: NOT DETECTED
Opiates: NOT DETECTED
Tetrahydrocannabinol: NOT DETECTED

## 2021-01-08 LAB — COMPREHENSIVE METABOLIC PANEL
ALT: 16 U/L (ref 0–44)
AST: 15 U/L (ref 15–41)
Albumin: 2.6 g/dL — ABNORMAL LOW (ref 3.5–5.0)
Alkaline Phosphatase: 82 U/L (ref 38–126)
Anion gap: 8 (ref 5–15)
BUN: 29 mg/dL — ABNORMAL HIGH (ref 6–20)
CO2: 20 mmol/L — ABNORMAL LOW (ref 22–32)
Calcium: 7.9 mg/dL — ABNORMAL LOW (ref 8.9–10.3)
Chloride: 105 mmol/L (ref 98–111)
Creatinine, Ser: 1.27 mg/dL — ABNORMAL HIGH (ref 0.61–1.24)
GFR, Estimated: >60 mL/min (ref >60)
Glucose, Bld: 223 mg/dL — ABNORMAL HIGH (ref 70–99)
Potassium: 4.6 mmol/L (ref 3.5–5.1)
Sodium: 133 mmol/L — ABNORMAL LOW (ref 135–145)
Total Bilirubin: 0.6 mg/dL (ref 0.3–1.2)
Total Protein: 6.2 g/dL — ABNORMAL LOW (ref 6.5–8.1)

## 2021-01-08 LAB — CBC
HCT: 30.4 % — ABNORMAL LOW (ref 39.0–52.0)
Hemoglobin: 10 g/dL — ABNORMAL LOW (ref 13.0–17.0)
MCH: 25.8 pg — ABNORMAL LOW (ref 26.0–34.0)
MCHC: 32.9 g/dL (ref 30.0–36.0)
MCV: 78.6 fL — ABNORMAL LOW (ref 80.0–100.0)
Platelets: 235 10*3/uL (ref 150–400)
RBC: 3.87 MIL/uL — ABNORMAL LOW (ref 4.22–5.81)
RDW: 13.2 % (ref 11.5–15.5)
WBC: 14 10*3/uL — ABNORMAL HIGH (ref 4.0–10.5)
nRBC: 0 % (ref 0.0–0.2)

## 2021-01-08 LAB — HEMOGLOBIN A1C
Hgb A1c MFr Bld: 8.1 % — ABNORMAL HIGH (ref 4.8–5.6)
Mean Plasma Glucose: 185.77 mg/dL

## 2021-01-08 LAB — GLUCOSE, CAPILLARY
Glucose-Capillary: 202 mg/dL — ABNORMAL HIGH (ref 70–99)
Glucose-Capillary: 170 mg/dL — ABNORMAL HIGH (ref 70–99)
Glucose-Capillary: 151 mg/dL — ABNORMAL HIGH (ref 70–99)
Glucose-Capillary: 158 mg/dL — ABNORMAL HIGH (ref 70–99)
Glucose-Capillary: 181 mg/dL — ABNORMAL HIGH (ref 70–99)

## 2021-01-08 LAB — HIV ANTIBODY (ROUTINE TESTING W REFLEX): HIV Screen 4th Generation wRfx: NONREACTIVE

## 2021-01-08 LAB — PROCALCITONIN: Procalcitonin: 0.54 ng/mL

## 2021-01-08 SURGERY — INCISION AND DRAINAGE
Anesthesia: Monitor Anesthesia Care | Site: Foot | Laterality: Right

## 2021-01-08 MED ORDER — FENTANYL CITRATE (PF) 100 MCG/2ML IJ SOLN
INTRAMUSCULAR | Status: DC | PRN
  Administered 2021-01-08 (×2): 25 ug via INTRAVENOUS

## 2021-01-08 MED ORDER — PROPOFOL 500 MG/50ML IV EMUL
INTRAVENOUS | Status: DC | PRN
Start: 2021-01-08 — End: 2021-01-08
  Administered 2021-01-08: 20 mg via INTRAVENOUS

## 2021-01-08 MED ORDER — BUPIVACAINE HCL (PF) 0.5 % IJ SOLN
INTRAMUSCULAR | Status: AC
  Filled 2021-01-08: qty 30

## 2021-01-08 MED ORDER — ONDANSETRON HCL 4 MG/2ML IJ SOLN
INTRAMUSCULAR | Status: DC | PRN
  Administered 2021-01-08: 4 mg via INTRAVENOUS

## 2021-01-08 MED ORDER — OXYCODONE HCL 5 MG/5ML PO SOLN: 5.0000 mg | Freq: Once | ORAL | Status: DC | PRN

## 2021-01-08 MED ORDER — LIDOCAINE HCL 2 % IJ SOLN
INTRAMUSCULAR | Status: AC
  Filled 2021-01-08: qty 20

## 2021-01-08 MED ORDER — ONDANSETRON HCL 4 MG/2ML IJ SOLN
INTRAMUSCULAR | Status: AC
  Filled 2021-01-08: qty 2

## 2021-01-08 MED ORDER — SODIUM CHLORIDE 0.9 % IR SOLN
Status: DC | PRN
  Administered 2021-01-08: 2000 mL

## 2021-01-08 MED ORDER — PROPOFOL 10 MG/ML IV BOLUS
INTRAVENOUS | Status: AC
  Filled 2021-01-08: qty 80

## 2021-01-08 MED ORDER — CEFEPIME HCL 2 G IJ SOLR
2.0000 g | Freq: Three times a day (TID) | INTRAMUSCULAR | Status: DC
Start: 2021-01-08 — End: 2021-01-10
  Administered 2021-01-08 – 2021-01-10 (×6): 2 g via INTRAVENOUS
  Filled 2021-01-08 (×7): qty 2

## 2021-01-08 MED ORDER — LIDOCAINE HCL (PF) 2 % IJ SOLN
INTRAMUSCULAR | Status: AC
  Filled 2021-01-08: qty 5

## 2021-01-08 MED ORDER — FENTANYL CITRATE (PF) 100 MCG/2ML IJ SOLN
INTRAMUSCULAR | Status: AC
  Filled 2021-01-08: qty 2

## 2021-01-08 MED ORDER — MIDAZOLAM HCL 5 MG/5ML IJ SOLN
INTRAMUSCULAR | Status: DC | PRN
  Administered 2021-01-08: 2 mg via INTRAVENOUS

## 2021-01-08 MED ORDER — 0.9 % SODIUM CHLORIDE (POUR BTL) OPTIME
TOPICAL | Status: DC | PRN
  Administered 2021-01-08: 1000 mL

## 2021-01-08 MED ORDER — PROPOFOL 500 MG/50ML IV EMUL
INTRAVENOUS | Status: DC | PRN
  Administered 2021-01-08: 75 ug/kg/min via INTRAVENOUS

## 2021-01-08 MED ORDER — DEXAMETHASONE SODIUM PHOSPHATE 10 MG/ML IJ SOLN
INTRAMUSCULAR | Status: AC
  Filled 2021-01-08: qty 1

## 2021-01-08 MED ORDER — METRONIDAZOLE 500 MG/100ML IV SOLN
500.0000 mg | Freq: Two times a day (BID) | INTRAVENOUS | Status: DC
  Administered 2021-01-08 – 2021-01-10 (×5): 500 mg via INTRAVENOUS
  Filled 2021-01-08 (×5): qty 100

## 2021-01-08 MED ORDER — DEXMEDETOMIDINE (PRECEDEX) IN NS 20 MCG/5ML (4 MCG/ML) IV SYRINGE
PREFILLED_SYRINGE | INTRAVENOUS | Status: DC | PRN
  Administered 2021-01-08: 8 ug via INTRAVENOUS
  Administered 2021-01-08: 4 ug via INTRAVENOUS

## 2021-01-08 MED ORDER — LACTATED RINGERS IV SOLN: INTRAVENOUS | Status: DC

## 2021-01-08 MED ORDER — LABETALOL HCL 5 MG/ML IV SOLN
INTRAVENOUS | Status: AC
  Administered 2021-01-08: 10 mg via INTRAVENOUS
  Filled 2021-01-08: qty 4

## 2021-01-08 MED ORDER — KETOROLAC TROMETHAMINE 30 MG/ML IJ SOLN: 30.0000 mg | Freq: Once | INTRAMUSCULAR | Status: DC | PRN

## 2021-01-08 MED ORDER — ONDANSETRON HCL 4 MG/2ML IJ SOLN: 4.0000 mg | Freq: Once | INTRAMUSCULAR | Status: DC | PRN

## 2021-01-08 MED ORDER — CHLORHEXIDINE GLUCONATE 0.12 % MT SOLN
15.0000 mL | Freq: Once | OROMUCOSAL | Status: AC
  Administered 2021-01-08: 15 mL via OROMUCOSAL

## 2021-01-08 MED ORDER — MIDAZOLAM HCL 2 MG/2ML IJ SOLN
INTRAMUSCULAR | Status: AC
  Filled 2021-01-08: qty 2

## 2021-01-08 MED ORDER — OXYCODONE HCL 5 MG PO TABS: 5.0000 mg | ORAL_TABLET | Freq: Once | ORAL | Status: DC | PRN

## 2021-01-08 MED ORDER — VANCOMYCIN HCL 1250 MG/250ML IV SOLN
1250.0000 mg | INTRAVENOUS | Status: DC
Start: 2021-01-08 — End: 2021-01-10
  Administered 2021-01-08 – 2021-01-09 (×2): 1250 mg via INTRAVENOUS
  Filled 2021-01-08 (×2): qty 250

## 2021-01-08 MED ORDER — HYDROMORPHONE HCL 1 MG/ML IJ SOLN: 0.2500 mg | INTRAMUSCULAR | Status: DC | PRN

## 2021-01-08 MED ORDER — BUPIVACAINE HCL 0.5 % IJ SOLN
INTRAMUSCULAR | Status: DC | PRN
  Administered 2021-01-08: 20 mL

## 2021-01-08 MED ORDER — LABETALOL HCL 5 MG/ML IV SOLN: 10.0000 mg | INTRAVENOUS | Status: DC | PRN

## 2021-01-08 SURGICAL SUPPLY — 38 items
BLADE HEX COATED 2.75 (ELECTRODE) IMPLANT
BLADE SURG 15 STRL LF DISP TIS (BLADE) ×1 IMPLANT
BLADE SURG 15 STRL SS (BLADE) ×1
BNDG COHESIVE 4X5 TAN ST LF (GAUZE/BANDAGES/DRESSINGS) IMPLANT
BNDG ELASTIC 3X5.8 VLCR STR LF (GAUZE/BANDAGES/DRESSINGS) IMPLANT
BNDG ELASTIC 4X5.8 VLCR STR LF (GAUZE/BANDAGES/DRESSINGS) ×2 IMPLANT
BNDG ESMARK 4X9 LF (GAUZE/BANDAGES/DRESSINGS) IMPLANT
BNDG GAUZE ELAST 4 BULKY (GAUZE/BANDAGES/DRESSINGS) ×2 IMPLANT
CHLORAPREP W/TINT 26 (MISCELLANEOUS) IMPLANT
COVER BACK TABLE 60X90IN (DRAPES) ×2 IMPLANT
CUFF TOURN SGL QUICK 18X4 (TOURNIQUET CUFF) IMPLANT
DRAPE POUCH INSTRU U-SHP 10X18 (DRAPES) IMPLANT
DRAPE SHEET LG 3/4 BI-LAMINATE (DRAPES) ×4 IMPLANT
DRSG PAD ABDOMINAL 8X10 ST (GAUZE/BANDAGES/DRESSINGS) IMPLANT
GAUZE PACKING IODOFORM 1X5 (PACKING) ×2 IMPLANT
GAUZE SPONGE 4X4 12PLY STRL (GAUZE/BANDAGES/DRESSINGS) ×2 IMPLANT
GAUZE XEROFORM 1X8 LF (GAUZE/BANDAGES/DRESSINGS) IMPLANT
GLOVE SRG 8 PF TXTR STRL LF DI (GLOVE) ×1 IMPLANT
GLOVE SURG ENC MOIS LTX SZ7.5 (GLOVE) IMPLANT
GLOVE SURG NEOPR MICRO LF SZ8 (GLOVE) IMPLANT
GLOVE SURG UNDER POLY LF SZ8 (GLOVE) ×1
GOWN STRL REUS W/TWL XL LVL3 (GOWN DISPOSABLE) ×2 IMPLANT
KIT BASIN OR (CUSTOM PROCEDURE TRAY) ×2 IMPLANT
NEEDLE HYPO 25X1 1.5 SAFETY (NEEDLE) ×2 IMPLANT
PACK ORTHO EXTREMITY (CUSTOM PROCEDURE TRAY) ×2 IMPLANT
PENCIL SMOKE EVACUATOR (MISCELLANEOUS) IMPLANT
SET IRRIG Y TYPE TUR BLADDER L (SET/KITS/TRAYS/PACK) IMPLANT
SPONGE T-LAP 18X18 ~~LOC~~+RFID (SPONGE) ×2 IMPLANT
STAPLER VISISTAT 35W (STAPLE) IMPLANT
STOCKINETTE 8 INCH (MISCELLANEOUS) ×2 IMPLANT
SUT ETHILON 4 0 PS 2 18 (SUTURE) IMPLANT
SUT MNCRL AB 3-0 PS2 18 (SUTURE) IMPLANT
SUT MON AB 5-0 PS2 18 (SUTURE) IMPLANT
SUT VIC AB 3-0 PS2 18 (SUTURE)
SUT VIC AB 3-0 PS2 18XBRD (SUTURE) IMPLANT
SUT VIC AB 4-0 PS2 18 (SUTURE) IMPLANT
SYR CONTROL 10ML LL (SYRINGE) ×2 IMPLANT
TOWEL OR 17X26 10 PK STRL BLUE (TOWEL DISPOSABLE) ×2 IMPLANT

## 2021-01-08 NOTE — Anesthesia Preprocedure Evaluation (Addendum)
Anesthesia Evaluation  Patient identified by MRN, date of birth, ID band Patient awake    Reviewed: Allergy & Precautions, NPO status , Patient's Chart, lab work & pertinent test results  Airway Mallampati: II  TM Distance: >3 FB Neck ROM: Full    Dental no notable dental hx. (+) Teeth Intact, Dental Advisory Given   Pulmonary neg pulmonary ROS,    Pulmonary exam normal breath sounds clear to auscultation       Cardiovascular Exercise Tolerance: Good negative cardio ROS Normal cardiovascular exam Rhythm:Regular Rate:Normal  01/08/21 EKG NSR R 88   Neuro/Psych negative neurological ROS     GI/Hepatic negative GI ROS, Neg liver ROS,   Endo/Other  diabetes  Renal/GU Lab Results      Component                Value               Date                      CREATININE               1.27 (H)            01/08/2021                BUN                      29 (H)              01/08/2021                NA                       133 (L)             01/08/2021                K                        4.6                 01/08/2021                CL                       105                 01/08/2021                CO2                      20 (L)              01/08/2021                Musculoskeletal   Abdominal   Peds  Hematology Lab Results      Component                Value               Date                      WBC                      14.0 (H)  01/08/2021                HGB                      10.0 (L)            01/08/2021                HCT                      30.4 (L)            01/08/2021                MCV                      78.6 (L)            01/08/2021                PLT                      235                 01/08/2021              Anesthesia Other Findings   Reproductive/Obstetrics                            Anesthesia Physical Anesthesia Plan  ASA: 2  Anesthesia  Plan: MAC   Post-op Pain Management: Minimal or no pain anticipated   Induction:   PONV Risk Score and Plan: 2 and Treatment may vary due to age or medical condition and Ondansetron  Airway Management Planned: Natural Airway and Nasal Cannula  Additional Equipment: None  Intra-op Plan:   Post-operative Plan:   Informed Consent: I have reviewed the patients History and Physical, chart, labs and discussed the procedure including the risks, benefits and alternatives for the proposed anesthesia with the patient or authorized representative who has indicated his/her understanding and acceptance.     Dental advisory given  Plan Discussed with:   Anesthesia Plan Comments: (MAC )        Anesthesia Quick Evaluation

## 2021-01-08 NOTE — Brief Op Note (Signed)
01/08/2021  1:46 PM  PATIENT:  Richard Pittman  50 y.o. male  PRE-OPERATIVE DIAGNOSIS:  Abscess R foot  POST-OPERATIVE DIAGNOSIS:  Abscess R foot  PROCEDURE:  Procedure(s): INCISION AND DRAINAGE (Right)  SURGEON:  Surgeon(s) and Role:    * Evelina Bucy, DPM - Primary  PHYSICIAN ASSISTANT:   ASSISTANTS: none   ANESTHESIA:   local and MAC  EBL:  15 ml   BLOOD ADMINISTERED:none  DRAINS: none   LOCAL MEDICATIONS USED:  MARCAINE    and Amount: 20 ml  SPECIMEN:   ID Type Source Tests Collected by Time Destination  A : RIGHT FOOT ABSCESS CULTURE Tissue PATH Soft tissue AEROBIC/ANAEROBIC CULTURE W GRAM STAIN (SURGICAL/DEEP WOUND) Evelina Bucy, DPM 01/08/2021 1327      DISPOSITION OF SPECIMEN:  micro  COUNTS:  YES  TOURNIQUET:  * No tourniquets in log *  DICTATION: .Dragon Dictation  PLAN OF CARE: Admit to inpatient   PATIENT DISPOSITION:  PACU - hemodynamically stable.

## 2021-01-08 NOTE — Transfer of Care (Signed)
Immediate Anesthesia Transfer of Care Note  Patient: Eugene Garnet  Procedure(s) Performed: INCISION AND DRAINAGE (Right: Foot)  Patient Location: PACU  Anesthesia Type:MAC  Level of Consciousness: awake, alert , oriented and patient cooperative  Airway & Oxygen Therapy: Patient Spontanous Breathing and Patient connected to face mask oxygen  Post-op Assessment: Report given to RN and Post -op Vital signs reviewed and stable  Post vital signs: Reviewed and stable  Last Vitals:  Vitals Value Taken Time  BP 156/69 01/08/21 1350  Temp    Pulse 101 01/08/21 1359  Resp 14 01/08/21 1359  SpO2 97 % 01/08/21 1359  Vitals shown include unvalidated device data.  Last Pain:  Vitals:   01/08/21 1140  TempSrc:   PainSc: 0-No pain         Complications: No notable events documented.

## 2021-01-08 NOTE — Anesthesia Procedure Notes (Signed)
Procedure Name: MAC Date/Time: 01/08/2021 1:01 PM Performed by: West Pugh, CRNA Pre-anesthesia Checklist: Patient identified, Emergency Drugs available, Suction available, Patient being monitored and Timeout performed Patient Re-evaluated:Patient Re-evaluated prior to induction Oxygen Delivery Method: Simple face mask Preoxygenation: Pre-oxygenation with 100% oxygen Placement Confirmation: positive ETCO2

## 2021-01-08 NOTE — Consult Note (Signed)
Reason for Consult:right foot abscess Referring Physician: [facility] Richard Pittman is an 50 y.o. male.  HPI: This is a 50 y.o. male who presents for 2 week history s/p puncture wound to the right foot. Nail when through his work boot. Went to UC and was given PO abx. Presented to ED yesterday after experiencing fevers/chills.   History obtained through the help of interpreter.  Past Medical History:  Diagnosis Date   Diabetes mellitus without complication ([place])     History reviewed. No pertinent surgical history.  Family History  Problem Relation Age of Onset   Diabetes Mother     Social History:  reports that he has never smoked. He has never used smokeless tobacco. He reports that he does not currently use alcohol. He reports that he does not use drugs.  Allergies: No Known Allergies  Medications: I have reviewed the patient's current medications.  Results for orders placed or performed during the hospital encounter of 01/07/21 (from the past 48 hour(s))  Comprehensive metabolic panel     Status: Abnormal   Collection Time: 01/07/21  5:00 PM  Result Value Ref Range   Sodium 131 (L) 135 - 145 mmol/L   Potassium 4.8 3.5 - 5.1 mmol/L   Chloride 101 98 - 111 mmol/L   CO2 20 (L) 22 - 32 mmol/L   Glucose, Bld 343 (H) 70 - 99 mg/dL    Comment: Glucose reference range applies only to samples taken after fasting for at least 8 hours.   BUN 33 (H) 6 - 20 mg/dL   Creatinine, Ser 1.85 (H) 0.61 - 1.24 mg/dL   Calcium 7.8 (L) 8.9 - 10.3 mg/dL   Total Protein 6.6 6.5 - 8.1 g/dL   Albumin 3.0 (L) 3.5 - 5.0 g/dL   AST 12 (L) 15 - 41 U/L   ALT 15 0 - 44 U/L   Alkaline Phosphatase 82 38 - 126 U/L   Total Bilirubin 0.5 0.3 - 1.2 mg/dL   GFR, Estimated 44 (L) >60 mL/min    Comment: (NOTE) Calculated using the CKD-EPI Creatinine Equation (2021)    Anion gap 10 5 - 15    Comment: Performed at [facility], [place] [address] [place], [place] [address]  CBC with  Differential     Status: Abnormal   Collection Time: 01/07/21  5:00 PM  Result Value Ref Range   WBC 14.7 (H) 4.0 - 10.5 K/uL   RBC 4.09 (L) 4.22 - 5.81 MIL/uL   Hemoglobin 10.6 (L) 13.0 - 17.0 g/dL   HCT 31.9 (L) 39.0 - 52.0 %   MCV 78.0 (L) 80.0 - 100.0 fL   MCH 25.9 (L) 26.0 - 34.0 pg   MCHC 33.2 30.0 - 36.0 g/dL   RDW 13.0 11.5 - 15.5 %   Platelets 256 150 - 400 K/uL   nRBC 0.0 0.0 - 0.2 %   Neutrophils Relative % 85 %   Neutro Abs 12.6 (H) 1.7 - 7.7 K/uL   Lymphocytes Relative 6 %   Lymphs Abs 0.9 0.7 - 4.0 K/uL   Monocytes Relative 6 %   Monocytes Absolute 0.8 0.1 - 1.0 K/uL   Eosinophils Relative 1 %   Eosinophils Absolute 0.2 0.0 - 0.5 K/uL   Basophils Relative 1 %   Basophils Absolute 0.1 0.0 - 0.1 K/uL   Immature Granulocytes 1 %   Abs Immature Granulocytes 0.12 (H) 0.00 - 0.07 K/uL    Comment: Performed at [facility],  [address], [place], [place] [address]  Sedimentation rate     Status: Abnormal   Collection Time: 01/07/21  5:00 PM  Result Value Ref Range   Sed Rate 92 (H) 0 - 16 mm/hr    Comment: Performed at [facility], [address] [place], [place] [address]  C-reactive protein     Status: Abnormal   Collection Time: 01/07/21  5:50 PM  Result Value Ref Range   CRP 30.6 (H) <1.0 mg/dL    Comment: Performed at [facility], [address] [place], [place] [address]  Resp Panel by RT-PCR (Flu A&B, Covid) Nasopharyngeal Swab     Status: None   Collection Time: 01/07/21  5:50 PM   Specimen: Nasopharyngeal Swab; Nasopharyngeal(NP) swabs in vial transport medium  Result Value Ref Range   SARS Coronavirus 2 by RT PCR NEGATIVE NEGATIVE    Comment: (NOTE) SARS-CoV-2 target nucleic acids are NOT DETECTED.  The SARS-CoV-2 RNA is generally detectable in upper respiratory specimens during the acute phase of infection. The lowest concentration of SARS-CoV-2 viral copies this assay can detect is 138 copies/mL. A  negative result does not preclude SARS-Cov-2 infection and should not be used as the sole basis for treatment or other patient management decisions. A negative result may occur with  improper specimen collection/handling, submission of specimen other than nasopharyngeal swab, presence of viral mutation(s) within the areas targeted by this assay, and inadequate number of viral copies(<138 copies/mL). A negative result must be combined with clinical observations, patient history, and epidemiological information. The expected result is Negative.  Fact Sheet for Patients:  BloggerCourse.com  Fact Sheet for Healthcare Providers:  SeriousBroker.it  This test is no t yet approved or cleared by the [place] FDA and  has been authorized for detection and/or diagnosis of SARS-CoV-2 by FDA under an Emergency Use Authorization (EUA). This EUA will remain  in effect (meaning this test can be used) for the duration of the COVID-19 declaration under Section 564(b)(1) of the Act, 21 U.S.C.section 360bbb-3(b)(1), unless the authorization is terminated  or revoked sooner.       Influenza A by PCR NEGATIVE NEGATIVE   Influenza B by PCR NEGATIVE NEGATIVE    Comment: (NOTE) The Xpert Xpress SARS-CoV-2/FLU/RSV plus assay is intended as an aid in the diagnosis of influenza from Nasopharyngeal swab specimens and should not be used as a sole basis for treatment. Nasal washings and aspirates are unacceptable for Xpert Xpress SARS-CoV-2/FLU/RSV testing.  Fact Sheet for Patients: BloggerCourse.com  Fact Sheet for Healthcare Providers: SeriousBroker.it  This test is not yet approved or cleared by the [place] FDA and has been authorized for detection and/or diagnosis of SARS-CoV-2 by FDA under an Emergency Use Authorization (EUA). This EUA will remain in effect (meaning this test can be used)  for the duration of the COVID-19 declaration under Section 564(b)(1) of the Act, 21 U.S.C. section 360bbb-3(b)(1), unless the authorization is terminated or revoked.  Performed at [facility], [address], [place], [place] [address]   Blood culture (routine x 2)     Status: None (Preliminary result)   Collection Time: 01/07/21  6:30 PM   Specimen: Left Antecubital; Blood  Result Value Ref Range   Specimen Description      LEFT ANTECUBITAL Performed at [facility], [address], [place], [place] [address]    Special Requests      BOTTLES DRAWN AEROBIC AND ANAEROBIC Blood Culture adequate  volume Performed at [facility], [place] [address], [place], [place] [address]    Culture      NO GROWTH < 12 HOURS Performed at [facility] [address] [place], [place] [address]    Report Status PENDING   Lactic acid, plasma     Status: None   Collection Time: 01/07/21  6:30 PM  Result Value Ref Range   Lactic Acid, Venous 1.6 0.5 - 1.9 mmol/L    Comment: Performed at [facility], [place] [address] [place], [place] [address]  Procalcitonin - Baseline     Status: None   Collection Time: 01/07/21  6:30 PM  Result Value Ref Range   Procalcitonin 0.54 ng/mL    Comment:        Interpretation: PCT > 0.5 ng/mL and <= 2 ng/mL: Systemic infection (sepsis) is possible, but other conditions are known to elevate PCT as well. (NOTE)       Sepsis PCT Algorithm           Lower Respiratory Tract                                      Infection PCT Algorithm    ----------------------------     ----------------------------         PCT < 0.25 ng/mL                PCT < 0.10 ng/mL          Strongly encourage             Strongly discourage   discontinuation of antibiotics    initiation of antibiotics    ----------------------------     -----------------------------       PCT 0.25 - 0.50 ng/mL            PCT 0.10 -  0.25 ng/mL               OR       >80% decrease in PCT            Discourage initiation of                                            antibiotics      Encourage discontinuation           of antibiotics    ----------------------------     -----------------------------         PCT >= 0.50 ng/mL              PCT 0.26 - 0.50 ng/mL                AND       <80% decrease in PCT             Encourage initiation of                                             antibiotics       Encourage continuation           of antibiotics    ----------------------------     -----------------------------  PCT >= 0.50 ng/mL                  PCT > 0.50 ng/mL               AND         increase in PCT                  Strongly encourage                                      initiation of antibiotics    Strongly encourage escalation           of antibiotics                                     -----------------------------                                           PCT <= 0.25 ng/mL                                                 OR                                        > 80% decrease in PCT                                      Discontinue / Do not initiate                                             antibiotics  Performed at [facility] [address], [place], [place] [address]   Blood culture (routine x 2)     Status: None (Preliminary result)   Collection Time: 01/07/21  6:34 PM   Specimen: Right Antecubital; Blood  Result Value Ref Range   Specimen Description      RIGHT ANTECUBITAL Performed at [facility] [address] [place], [place] [address]    Special Requests      BOTTLES DRAWN AEROBIC AND ANAEROBIC Blood Culture adequate volume Performed at [facility] [address], [place], [place] [address]    Culture      NO GROWTH < 12 HOURS Performed at [facility] [address], [place], [place] [address]    Report  Status PENDING   Glucose, capillary     Status: Abnormal   Collection Time: 01/07/21  8:20 PM  Result Value Ref Range   Glucose-Capillary 243 (H) 70 - 99 mg/dL    Comment: Glucose reference range applies only to samples taken after fasting for at least 8 hours.  Lactic acid, plasma     Status: None   Collection Time: 01/07/21  8:21 PM  Result  Value Ref Range   Lactic Acid, Venous 1.3 0.5 - 1.9 mmol/L    Comment: Performed at [facility], [place] [address], [place], [place] [address]  HIV Antibody (routine testing w rflx)     Status: None   Collection Time: 01/07/21  8:21 PM  Result Value Ref Range   HIV Screen 4th Generation wRfx Non Reactive Non Reactive    Comment: Performed at [facility], [place] [address], [place], [place] [address]  TSH     Status: None   Collection Time: 01/07/21  8:21 PM  Result Value Ref Range   TSH 3.793 0.350 - 4.500 uIU/mL    Comment: Performed by a 3rd Generation assay with a functional sensitivity of <=0.01 uIU/mL. Performed at [facility], [place] [address] [place], [place] [address]   Rapid urine drug screen (hospital performed)     Status: None   Collection Time: 01/08/21 12:38 AM  Result Value Ref Range   Opiates NONE DETECTED NONE DETECTED   Cocaine NONE DETECTED NONE DETECTED   Benzodiazepines NONE DETECTED NONE DETECTED   Amphetamines NONE DETECTED NONE DETECTED   Tetrahydrocannabinol NONE DETECTED NONE DETECTED   Barbiturates NONE DETECTED NONE DETECTED    Comment: (NOTE) DRUG SCREEN FOR MEDICAL PURPOSES ONLY.  IF CONFIRMATION IS NEEDED FOR ANY PURPOSE, NOTIFY LAB WITHIN 5 DAYS.  LOWEST DETECTABLE LIMITS FOR URINE DRUG SCREEN Drug Class                     Cutoff (ng/mL) Amphetamine and metabolites    1000 Barbiturate and metabolites    200 Benzodiazepine                 [id number] Tricyclics and metabolites     300 Opiates and metabolites        300 Cocaine and metabolites        300 THC                             50 Performed at [facility], [place] [address] [place], [place] [address]   Procalcitonin     Status: None   Collection Time: 01/08/21  4:53 AM  Result Value Ref Range   Procalcitonin 0.54 ng/mL    Comment:        Interpretation: PCT > 0.5 ng/mL and <= 2 ng/mL: Systemic infection (sepsis) is possible, but other conditions are known to elevate PCT as well. (NOTE)       Sepsis PCT Algorithm           Lower Respiratory Tract                                      Infection PCT Algorithm    ----------------------------     ----------------------------         PCT < 0.25 ng/mL                PCT < 0.10 ng/mL          Strongly encourage             Strongly discourage   discontinuation of antibiotics    initiation of antibiotics    ----------------------------     -----------------------------       PCT 0.25 - 0.50 ng/mL  PCT 0.10 - 0.25 ng/mL               OR       >80% decrease in PCT            Discourage initiation of                                            antibiotics      Encourage discontinuation           of antibiotics    ----------------------------     -----------------------------         PCT >= 0.50 ng/mL              PCT 0.26 - 0.50 ng/mL                AND       <80% decrease in PCT             Encourage initiation of                                             antibiotics       Encourage continuation           of antibiotics    ----------------------------     -----------------------------        PCT >= 0.50 ng/mL                  PCT > 0.50 ng/mL               AND         increase in PCT                  Strongly encourage                                      initiation of antibiotics    Strongly encourage escalation           of antibiotics                                     -----------------------------                                           PCT <= 0.25 ng/mL                                                 OR                                         > 80% decrease in PCT  Discontinue / Do not initiate                                             antibiotics  Performed at [facility] [address], [place], [place] [address]   Comprehensive metabolic panel     Status: Abnormal   Collection Time: 01/08/21  4:53 AM  Result Value Ref Range   Sodium 133 (L) 135 - 145 mmol/L   Potassium 4.6 3.5 - 5.1 mmol/L   Chloride 105 98 - 111 mmol/L   CO2 20 (L) 22 - 32 mmol/L   Glucose, Bld 223 (H) 70 - 99 mg/dL    Comment: Glucose reference range applies only to samples taken after fasting for at least 8 hours.   BUN 29 (H) 6 - 20 mg/dL   Creatinine, Ser 1.27 (H) 0.61 - 1.24 mg/dL   Calcium 7.9 (L) 8.9 - 10.3 mg/dL   Total Protein 6.2 (L) 6.5 - 8.1 g/dL   Albumin 2.6 (L) 3.5 - 5.0 g/dL   AST 15 15 - 41 U/L   ALT 16 0 - 44 U/L   Alkaline Phosphatase 82 38 - 126 U/L   Total Bilirubin 0.6 0.3 - 1.2 mg/dL   GFR, Estimated >60 >60 mL/min    Comment: (NOTE) Calculated using the CKD-EPI Creatinine Equation (2021)    Anion gap 8 5 - 15    Comment: Performed at [facility], [place] [address] [place], [place] [address]  CBC     Status: Abnormal   Collection Time: 01/08/21  4:53 AM  Result Value Ref Range   WBC 14.0 (H) 4.0 - 10.5 K/uL   RBC 3.87 (L) 4.22 - 5.81 MIL/uL   Hemoglobin 10.0 (L) 13.0 - 17.0 g/dL   HCT 30.4 (L) 39.0 - 52.0 %   MCV 78.6 (L) 80.0 - 100.0 fL   MCH 25.8 (L) 26.0 - 34.0 pg   MCHC 32.9 30.0 - 36.0 g/dL   RDW 13.2 11.5 - 15.5 %   Platelets 235 150 - 400 K/uL   nRBC 0.0 0.0 - 0.2 %    Comment: Performed at [facility], [place] [address] [place], [place] [address]  Hemoglobin A1c     Status: Abnormal   Collection Time: 01/08/21  4:53 AM  Result Value Ref Range   Hgb A1c MFr Bld 8.1 (H) 4.8 - 5.6 %    Comment: (NOTE) Pre diabetes:          5.7%-6.4%  Diabetes:              >6.4%  Glycemic  control for   <7.0% adults with diabetes    Mean Plasma Glucose 185.77 mg/dL    Comment: Performed at [facility] [address], [place], [place] [address]  Glucose, capillary     Status: Abnormal   Collection Time: 01/08/21  7:19 AM  Result Value Ref Range   Glucose-Capillary 202 (H) 70 - 99 mg/dL    Comment: Glucose reference range applies only to samples taken after fasting for at least 8 hours.  Glucose, capillary     Status: Abnormal   Collection Time: 01/08/21 11:42 AM  Result Value Ref Range   Glucose-Capillary 170 (H) 70 - 99 mg/dL    Comment: Glucose reference range applies only to samples taken after fasting for at  least 8 hours.    MR FOOT RIGHT WO CONTRAST  Result Date: 01/07/2021 CLINICAL DATA:  Right foot pain and swelling.  Stepped on a nail. EXAM: MRI OF THE RIGHT FOREFOOT WITHOUT CONTRAST TECHNIQUE: Multiplanar, multisequence MR imaging of the right forefoot was performed. No intravenous contrast was administered. COMPARISON:  Right foot x-rays from same day. FINDINGS: Bones/Joint/Cartilage No marrow signal abnormality. No fracture or dislocation. Joint spaces are preserved. No joint effusion. Ligaments Collateral ligaments are intact.  Lisfranc ligament is intact. Muscles and Tendons Flexor and extensor tendons are intact. No tenosynovitis. Increased T2 signal within the intrinsic muscles of the forefoot, nonspecific, but likely related to diabetic muscle changes. Soft tissue Severe diffuse soft tissue swelling of the right foot. Puncture wound noted at the plantar base of the second proximal phalanx with the regular 2.2 x 4.6 x 2.9 cm fluid collection surrounding the second through fourth proximal phalanges (series 6, image 9; series 8, image 13). Smaller small 8 mm fluid collection at the lateral plantar base of the fifth metatarsal head, likely an adventitial bursa. No soft tissue mass. IMPRESSION: 1. Puncture wound at the plantar base of the second proximal phalanx  with 2.2 x 4.6 x 2.9 cm abscess surrounding the second through fourth proximal phalanges. No osteomyelitis. 2. Severe right foot cellulitis. Electronically Signed   By: Titus Dubin M.D.   On: 01/07/2021 22:19   DG Foot Complete Right  Result Date: 01/07/2021 CLINICAL DATA:  Wound infection EXAM: RIGHT FOOT COMPLETE - 3+ VIEW COMPARISON:  01/06/2021 FINDINGS: No fracture or malalignment. No radiopaque foreign body. Increased plantar soft tissue swelling without emphysema. No periostitis or osseous destructive change. There are vascular calcifications. IMPRESSION: Increasing plantar soft tissue swelling. No acute osseous abnormality. Electronically Signed   By: Donavan Foil M.D.   On: 01/07/2021 16:56    ROS Blood pressure (!) 145/73, pulse 90, temperature 99.7 F (37.6 C), temperature source Oral, resp. rate 18, height 5\' 8"  (1.727 m), weight 100.2 kg, SpO2 99 %.  Vitals:   01/08/21 0502 01/08/21 0805  BP: 140/72 (!) 145/73  Pulse: 91 90  Resp: 18 18  Temp: 100 F (37.8 C) 99.7 F (37.6 C)  SpO2: 99% 99%    General AA&O x3. Normal mood and affect.  Vascular Right foot WWP with intact Pedal pulses  Neurologic Epicritic sensation grossly present.  Dermatologic (Wound) Right foot puncture wound plantar 2nd sulcus with local fluctuance, edema to the 2nd-4th digits. Digits with intact capillary refill. Warmth and erythema to the distal forefoot, edema to global right foot.  Orthopedic: Motor intact BLE.   MRI 11/22: Study Result  Narrative & Impression  CLINICAL DATA:  Right foot pain and swelling.  Stepped on a nail.   EXAM: MRI OF THE RIGHT FOREFOOT WITHOUT CONTRAST   TECHNIQUE: Multiplanar, multisequence MR imaging of the right forefoot was performed. No intravenous contrast was administered.   COMPARISON:  Right foot x-rays from same day.   FINDINGS: Bones/Joint/Cartilage   No marrow signal abnormality. No fracture or dislocation. Joint spaces are preserved. No joint  effusion.   Ligaments   Collateral ligaments are intact.  Lisfranc ligament is intact.   Muscles and Tendons Flexor and extensor tendons are intact. No tenosynovitis. Increased T2 signal within the intrinsic muscles of the forefoot, nonspecific, but likely related to diabetic muscle changes.   Soft tissue Severe diffuse soft tissue swelling of the right foot. Puncture wound noted at the plantar base of the second proximal phalanx  with the regular 2.2 x 4.6 x 2.9 cm fluid collection surrounding the second through fourth proximal phalanges (series 6, image 9; series 8, image 13). Smaller small 8 mm fluid collection at the lateral plantar base of the fifth metatarsal head, likely an adventitial bursa. No soft tissue mass.   IMPRESSION: 1. Puncture wound at the plantar base of the second proximal phalanx with 2.2 x 4.6 x 2.9 cm abscess surrounding the second through fourth proximal phalanges. No osteomyelitis. 2. Severe right foot cellulitis.      Assessment/Plan:  Right foot abscess -Labs reviewed. -Imaging: Studies independently reviewed -Antibiotics: continue empiric abx -WB Status: Heel WB -Surgical Plan: OR today for I and D. May need RTOR at later date depending upon intra-operative appearance.  Evelina Bucy 01/08/2021, 12:54 PM   Best available via secure chat for questions or concerns.

## 2021-01-08 NOTE — Progress Notes (Signed)
PROGRESS NOTE    Richard Pittman  WLN:[id number] DOB: 1971/01/25 DOA: 01/07/2021 PCP: System, Provider Not In  Outpatient Specialists: none    Brief Narrative:   From admission h and p  Richard Pittman is a 50 y.o. male with medical history significant for but not limited to diabetes mellitus type 2, obesity as well as other comorbidities who presented to the emergency room with a chief complaint of right foot pain and drainage.  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  His foot started swelling and became infected so he went to see his PCP who put him on Keflex.  Since then he has an increased level of drainage and he so he went to urgent care yesterday and was changed to doxycycline.  He started spiking fevers and chills yesterday but denied chest pain or shortness breath.  He is diabetic.  Because of his fever he came to the ED with significant right lower extremity swelling and erythema.  He states that he does have some pain but denies numbness, tingling, weakness.  No abdominal pain, nausea, vomiting.  [facility] was asked admit this patient for right diabetic foot infection in the setting of nail puncture and an MRI was requested as well as podiatry evaluation.   Assessment & Plan:   Principal Problem:   Foot infection Active Problems:   Uncontrolled type 2 diabetes mellitus with hyperglycemia (HCC)   AKI (acute kidney injury) (HCC)   Metabolic acidosis   Hyponatremia   Microcytic anemia   Obesity (BMI 30-39.9)  # Foot infection # Sepsis Severe. Stepped on nail 2 weeks ago. Failed outpatient keflex>doxy. Here sig swelling, now starting to drain, mri shows abscess. No retained foreign body on imaging. Received tetanus vaccine as outpt. Sepsis by leukocytosis, hr. Clinically stable - cont vanc/cefepime, add flagyl - podiatry consulted (epic order placed, I have also called  and left message) - maintain npo pending podiatry consult - ivf   # T2DM A1c 8.1.  - hold home glipizide and metformin - SSI  # Anemia Mild, microcytic, h 10. No bleeding - monitor for now  # AKI vs ckd Cr 1.85 on presentation, 1.27 after fluids. Normal kidney function 3 years ago, no more recents to compare - monitor for now - cont ivf  DVT prophylaxis: heparin Code Status: full Family Communication: wife updated @ bedside 11/233  Level of care: Telemetry Status is: Inpatient  Remains inpatient appropriate because: severity of illness        Consultants:  podiatry  Procedures: none  Antimicrobials:  Vanc/cefepime 11/22 Vanc/cefepime/flagyl 11/23>    Subjective: Mild foot pain otherwise feels well  Objective: Vitals:   01/07/21 2007 01/08/21 0113 01/08/21 0502 01/08/21 0805  BP: 125/70 127/64 140/72 (!) 145/73  Pulse: 87 92 91 90  Resp: 18 18 18 18   Temp: 98.9 F (37.2 C) 99.2 F (37.3 C) 100 F (37.8 C) 99.7 F (37.6 C)  TempSrc: Oral Oral Oral Oral  SpO2: 100% 99% 99% 99%  Weight:      Height:        Intake/Output Summary (Last 24 hours) at 01/08/2021 0838 Last data filed at 01/08/2021 0636 Gross per 24 hour  Intake 631.55 ml  Output 500 ml  Net 131.55 ml   Filed Weights   01/07/21 1653  Weight: 99.8 kg    Examination:  General exam: Appears calm  and comfortable  Respiratory system: Clear to auscultation. Respiratory effort normal. Cardiovascular system: S1 & S2 heard, RRR. No JVD, murmurs, rubs, gallops or clicks. No pedal edema. Gastrointestinal system: Abdomen is nondistended, soft and nontender. No organomegaly or masses felt. Normal bowel sounds heard. Central nervous system: Alert and oriented. No focal neurological deficits. Extremities: sig swelling of right foot, erythema distally, puncture wound plantar surface, starting to drain sanguinous fluid Skin: no rashes Psychiatry: Judgement and insight appear normal. Mood &  affect appropriate.     Data Reviewed: I have personally reviewed following labs and imaging studies  CBC: Recent Labs  Lab 01/06/21 1215 01/07/21 1700 01/08/21 0453  WBC 14.1* 14.7* 14.0*  NEUTROABS 12.1* 12.6*  --   HGB 11.6* 10.6* 10.0*  HCT 35.8* 31.9* 30.4*  MCV 79.0* 78.0* 78.6*  PLT 257 256 235   Basic Metabolic Panel: Recent Labs  Lab 01/06/21 1215 01/07/21 1700 01/08/21 0453  NA 131* 131* 133*  K 5.1 4.8 4.6  CL 99 101 105  CO2 23 20* 20*  GLUCOSE 402* 343* 223*  BUN 19 33* 29*  CREATININE 1.49* 1.85* 1.27*  CALCIUM 8.5* 7.8* 7.9*   GFR: Estimated Creatinine Clearance: 78.8 mL/min (A) (by C-G formula based on SCr of 1.27 mg/dL (H)). Liver Function Tests: Recent Labs  Lab 01/06/21 1215 01/07/21 1700 01/08/21 0453  AST 15 12* 15  ALT 17 15 16   ALKPHOS 91 82 82  BILITOT 0.6 0.5 0.6  PROT 6.6 6.6 6.2*  ALBUMIN 2.9* 3.0* 2.6*   No results for input(s): LIPASE, AMYLASE in the last 168 hours. No results for input(s): AMMONIA in the last 168 hours. Coagulation Profile: No results for input(s): INR, PROTIME in the last 168 hours. Cardiac Enzymes: No results for input(s): CKTOTAL, CKMB, CKMBINDEX, TROPONINI in the last 168 hours. BNP (last 3 results) No results for input(s): PROBNP in the last 8760 hours. HbA1C: Recent Labs    01/08/21 0453  HGBA1C 8.1*   CBG: Recent Labs  Lab 01/07/21 2020 01/08/21 0719  GLUCAP 243* 202*   Lipid Profile: No results for input(s): CHOL, HDL, LDLCALC, TRIG, CHOLHDL, LDLDIRECT in the last 72 hours. Thyroid Function Tests: Recent Labs    01/07/21 2021  TSH 3.793   Anemia Panel: No results for input(s): VITAMINB12, FOLATE, FERRITIN, TIBC, IRON, RETICCTPCT in the last 72 hours. Urine analysis:    Component Value Date/Time   COLORURINE YELLOW 07/17/2017 1259   APPEARANCEUR CLEAR 07/17/2017 1259   LABSPEC 1.032 (H) 07/17/2017 1259   PHURINE 5.0 07/17/2017 1259   GLUCOSEU >=500 (A) 07/17/2017 1259   HGBUR  SMALL (A) 07/17/2017 1259   BILIRUBINUR NEGATIVE 07/17/2017 1259   KETONESUR 20 (A) 07/17/2017 1259   PROTEINUR >=300 (A) 07/17/2017 1259   NITRITE NEGATIVE 07/17/2017 1259   LEUKOCYTESUR NEGATIVE 07/17/2017 1259   Sepsis Labs: @LABRCNTIP (procalcitonin:4,lacticidven:4)  ) Recent Results (from the past 240 hour(s))  Resp Panel by RT-PCR (Flu A&B, Covid) Nasopharyngeal Swab     Status: None   Collection Time: 01/07/21  5:50 PM   Specimen: Nasopharyngeal Swab; Nasopharyngeal(NP) swabs in vial transport medium  Result Value Ref Range Status   SARS Coronavirus 2 by RT PCR NEGATIVE NEGATIVE Final    Comment: (NOTE) SARS-CoV-2 target nucleic acids are NOT DETECTED.  The SARS-CoV-2 RNA is generally detectable in upper respiratory specimens during the acute phase of infection. The lowest concentration of SARS-CoV-2 viral copies this assay can detect is 138 copies/mL. A negative result does not preclude SARS-Cov-2  infection and should not be used as the sole basis for treatment or other patient management decisions. A negative result may occur with  improper specimen collection/handling, submission of specimen other than nasopharyngeal swab, presence of viral mutation(s) within the areas targeted by this assay, and inadequate number of viral copies(<138 copies/mL). A negative result must be combined with clinical observations, patient history, and epidemiological information. The expected result is Negative.  Fact Sheet for Patients:  BloggerCourse.com  Fact Sheet for Healthcare Providers:  SeriousBroker.it  This test is no t yet approved or cleared by the [place] FDA and  has been authorized for detection and/or diagnosis of SARS-CoV-2 by FDA under an Emergency Use Authorization (EUA). This EUA will remain  in effect (meaning this test can be used) for the duration of the COVID-19 declaration under Section 564(b)(1) of the Act,  21 U.S.C.section 360bbb-3(b)(1), unless the authorization is terminated  or revoked sooner.       Influenza A by PCR NEGATIVE NEGATIVE Final   Influenza B by PCR NEGATIVE NEGATIVE Final    Comment: (NOTE) The Xpert Xpress SARS-CoV-2/FLU/RSV plus assay is intended as an aid in the diagnosis of influenza from Nasopharyngeal swab specimens and should not be used as a sole basis for treatment. Nasal washings and aspirates are unacceptable for Xpert Xpress SARS-CoV-2/FLU/RSV testing.  Fact Sheet for Patients: BloggerCourse.com  Fact Sheet for Healthcare Providers: SeriousBroker.it  This test is not yet approved or cleared by the [place] FDA and has been authorized for detection and/or diagnosis of SARS-CoV-2 by FDA under an Emergency Use Authorization (EUA). This EUA will remain in effect (meaning this test can be used) for the duration of the COVID-19 declaration under Section 564(b)(1) of the Act, 21 U.S.C. section 360bbb-3(b)(1), unless the authorization is terminated or revoked.  Performed at [facility], [address] [place], [place] [address]          Radiology Studies: MR FOOT RIGHT WO CONTRAST  Result Date: 01/07/2021 CLINICAL DATA:  Right foot pain and swelling.  Stepped on a nail. EXAM: MRI OF THE RIGHT FOREFOOT WITHOUT CONTRAST TECHNIQUE: Multiplanar, multisequence MR imaging of the right forefoot was performed. No intravenous contrast was administered. COMPARISON:  Right foot x-rays from same day. FINDINGS: Bones/Joint/Cartilage No marrow signal abnormality. No fracture or dislocation. Joint spaces are preserved. No joint effusion. Ligaments Collateral ligaments are intact.  Lisfranc ligament is intact. Muscles and Tendons Flexor and extensor tendons are intact. No tenosynovitis. Increased T2 signal within the intrinsic muscles of the forefoot, nonspecific, but likely related to diabetic  muscle changes. Soft tissue Severe diffuse soft tissue swelling of the right foot. Puncture wound noted at the plantar base of the second proximal phalanx with the regular 2.2 x 4.6 x 2.9 cm fluid collection surrounding the second through fourth proximal phalanges (series 6, image 9; series 8, image 13). Smaller small 8 mm fluid collection at the lateral plantar base of the fifth metatarsal head, likely an adventitial bursa. No soft tissue mass. IMPRESSION: 1. Puncture wound at the plantar base of the second proximal phalanx with 2.2 x 4.6 x 2.9 cm abscess surrounding the second through fourth proximal phalanges. No osteomyelitis. 2. Severe right foot cellulitis. Electronically Signed   By: Obie Dredge M.D.   On: 01/07/2021 22:19   DG Foot Complete Right  Result Date: 01/07/2021 CLINICAL DATA:  Wound infection EXAM: RIGHT FOOT COMPLETE - 3+ VIEW COMPARISON:  01/06/2021 FINDINGS: No fracture or malalignment. No radiopaque foreign  body. Increased plantar soft tissue swelling without emphysema. No periostitis or osseous destructive change. There are vascular calcifications. IMPRESSION: Increasing plantar soft tissue swelling. No acute osseous abnormality. Electronically Signed   By: Jasmine Pang M.D.   On: 01/07/2021 16:56   DG Foot Complete Right  Result Date: 01/06/2021 CLINICAL DATA:  Right foot pain.  History of puncture wound EXAM: RIGHT FOOT COMPLETE - 3+ VIEW COMPARISON:  None. FINDINGS: There is no evidence of fracture or dislocation. Joint spaces are relatively preserved. No erosion or periosteal elevation is evident. Soft tissue swelling the plantar aspect of the foot in the region of the distal forefoot. No soft tissue gas or radiopaque foreign body evident. Vascular calcifications are present. IMPRESSION: 1. Soft tissue swelling of the plantar aspect of the foot. 2. No acute osseous abnormality.  No radiopaque foreign body. Electronically Signed   By: Duanne Guess D.O.   On: 01/06/2021  12:25        Scheduled Meds:  docusate sodium  100 mg Oral BID   heparin  5,000 Units Subcutaneous Q8H   insulin aspart  0-15 Units Subcutaneous TID WC   insulin aspart  0-5 Units Subcutaneous QHS   Continuous Infusions:  sodium chloride 75 mL/hr at 01/08/21 0738   ceFEPime (MAXIPIME) IV 2 g (01/08/21 2641)   vancomycin       LOS: 1 day    Time spent: 45 min    Silvano Bilis, MD [facility]   If 7PM-7AM, please contact night-coverage [email or web address] Password [facility] 01/08/2021, 8:38 AM

## 2021-01-08 NOTE — Op Note (Signed)
  Patient Name: Richard Pittman DOB: 1970-10-03  MRN: [id number]   Date of Surgery: 01/08/21  Surgeon: Dr. Hardie Pulley, DPM Assistants: none  Pre-operative Diagnosis:  Abscess right foot, puncture wound right foot Post-operative Diagnosis:  same Procedures:  1) Incision and drainage below the deep fascia - complex Pathology/Specimens: ID Type Source Tests Collected by Time Destination  A : RIGHT FOOT ABSCESS CULTURE Tissue PATH Soft tissue AEROBIC/ANAEROBIC CULTURE W Lonell Grandchild STAIN (SURGICAL/DEEP WOUND) Evelina Bucy, DPM 01/08/2021 1327    Anesthesia: MAC/local Hemostasis: anatomic Estimated Blood Loss: 5 ccs Materials: none Medications: 10 ccs 0.5% marcaine plain Complications: none  Indications for Procedure:  This is a 50 y.o. male with a severe right foot abscess confirmed on MRI. He experienced a puncture wound to the plantar right foot. He was seen at urgent care and put on antibiotics but infection developed. He is diabetic with an A1c of 8.1   Procedure in Detail: Patient was identified in pre-operative holding area. Formal consent was signed and the right lower extremity was marked. Patient was brought back to the operating room. Anesthesia was induced. The extremity was prepped and draped in the usual sterile fashion. Timeout was taken to confirm patient name, laterality, and procedure prior to incision.   Attention was then directed to the right foot. There was a puncture wound noted under the plantar surface of the right 2nd metatarsal area. Incision was made at this area with return of purulence. This was collected for culture.  While the puncture wound was noted submet 2, the wound probed to the 3rd and 4th metatarsal areas. Incision was made at a fluctuant area over the 4th metatarsal. Significant purulence was noted at this area, moreso than the 2nd metatarsal. This probed deep into the interspace between the 3rd and 4th metatarsals.  The skin underneath  the 2nd-4th metatarsals was boggy in appearance. A 15 blade was used to test viability. This tissue did not bleed. The non-viable skin and soft tissue was excised on the plantar surface between the 2nd-4th metatarsal areas until viable bleeding wound margin was noted. There was significant necrotic tissue on the plantar surface of the 3rd and 4th digit and metatarsal sulcus which was excised to bleeding tissue.  The wound was maximally expressed of all purulence. The wound was sharply excisionally debrided of non-viable tissue to bleeding healthy tissue. Debridement was carried to and including the deep fascial layer. The wound was then copiously irrigated with 3L of normal saline via pulse lavage.  Hemostasis was noted and the foot was then dressed with betadine wet to dry dressing. Patient tolerated the procedure well.  Disposition: Following a period of post-operative monitoring, patient will be transferred to the floor. He would benefit from continued inpatient treatment. He would benefit from repeat debridement during admission for control of the infection.Marland Kitchen

## 2021-01-08 NOTE — Progress Notes (Signed)
Pharmacy Antibiotic Note  Richard Pittman Richard Pittman is a 50 y.o. male with a h/o DM admitted on 01/07/2021 with right foot wound.  Pharmacy has been consulted for vancomycin a dosing.  11/22 foot XR: no acute osseous abnormality S/p I&D of wound 11/23  Plan: Adjust vancomycin to 1250mg  IV q24 hours (eAUC 418, Scr 1.27, IBW, Vd 0.5) Adjust cefepime to 2 g IV q8 hours Flagyl 500mg  IV q12 hours per MD Will f/u renal function, culture results, and clinical course Levels if/when indicated   Height: 5\' 8"  (172.7 cm) Weight: 100.2 kg (221 lb) IBW/kg (Calculated) : 68.4  Temp (24hrs), Avg:99.4 F (37.4 C), Min:98.4 F (36.9 C), Max:100.9 F (38.3 C)  Recent Labs  Lab 01/06/21 1215 01/07/21 1700 01/07/21 1830 01/07/21 2021 01/08/21 0453  WBC 14.1* 14.7*  --   --  14.0*  CREATININE 1.49* 1.85*  --   --  1.27*  LATICACIDVEN  --   --  1.6 1.3  --      Estimated Creatinine Clearance: 78.9 mL/min (A) (by C-G formula based on SCr of 1.27 mg/dL (H)).    No Known Allergies  Antimicrobials this admission: 11/22 vancomycin >>  11/22 cefepime >>  11/22 Flagyl >>   Dose adjustments this admission: Vanc 1000mg  IV q24h >> 1250mg  IV q24h   Microbiology results: 11/22 BCx: ngtd 11/23 Tissue Cx:    Thank you for allowing pharmacy to be a part of this patient's care.  12/22, PharmD 01/08/2021 2:49 PM

## 2021-01-08 NOTE — Plan of Care (Signed)
  Problem: Coping: Goal: Level of anxiety will decrease Outcome: Progressing   Problem: Pain Managment: Goal: General experience of comfort will improve Outcome: Progressing   Problem: Safety: Goal: Ability to remain free from injury will improve Outcome: Progressing   

## 2021-01-08 NOTE — Plan of Care (Signed)
  Problem: Education: Goal: Knowledge of General Education information will improve Description: Including pain rating scale, medication(s)/side effects and non-pharmacologic comfort measures Outcome: Progressing   Problem: Health Behavior/Discharge Planning: Goal: Ability to manage health-related needs will improve Outcome: Progressing   Problem: Clinical Measurements: Goal: Ability to maintain clinical measurements within normal limits will improve Outcome: Progressing Goal: Will remain free from infection Outcome: Progressing Goal: Diagnostic test results will improve Outcome: Progressing Goal: Cardiovascular complication will be avoided Outcome: Progressing   Problem: Activity: Goal: Risk for activity intolerance will decrease Outcome: Progressing   Problem: Nutrition: Goal: Adequate nutrition will be maintained Outcome: Progressing   Problem: Pain Managment: Goal: General experience of comfort will improve Outcome: Progressing   Problem: Safety: Goal: Ability to remain free from injury will improve Outcome: Progressing   Problem: Skin Integrity: Goal: Risk for impaired skin integrity will decrease Outcome: Progressing   

## 2021-01-09 ENCOUNTER — Ambulatory Visit: Payer: Self-pay | Admitting: Podiatry

## 2021-01-09 DIAGNOSIS — L089 Local infection of the skin and subcutaneous tissue, unspecified: Secondary | ICD-10-CM

## 2021-01-09 LAB — GLUCOSE, CAPILLARY
Glucose-Capillary: 201 mg/dL — ABNORMAL HIGH (ref 70–99)
Glucose-Capillary: 222 mg/dL — ABNORMAL HIGH (ref 70–99)
Glucose-Capillary: 203 mg/dL — ABNORMAL HIGH (ref 70–99)
Glucose-Capillary: 159 mg/dL — ABNORMAL HIGH (ref 70–99)

## 2021-01-09 LAB — PROCALCITONIN: Procalcitonin: 0.71 ng/mL

## 2021-01-09 MED ORDER — INSULIN GLARGINE-YFGN 100 UNIT/ML ~~LOC~~ SOLN
5.0000 [IU] | Freq: Every day | SUBCUTANEOUS | Status: DC
  Administered 2021-01-09: 5 [IU] via SUBCUTANEOUS
  Filled 2021-01-09: qty 0.05

## 2021-01-09 MED ORDER — SODIUM CHLORIDE 0.9 % IV SOLN: INTRAVENOUS | Status: DC

## 2021-01-09 NOTE — Plan of Care (Signed)
Pt aox4, pleasant spanish speaking man. Interpreter ipad at bedside.  S/P I&D of right food. Podiatry service at bedside this am. Plans for return to OR 11/25 Continued IV Abx per orders. Pain management. IVF.  Limited WBS with heel touch. Urinal at bedside.  No additional needs voiced. Pt refusing alarms at this time. Fall education provided.   Problem: Coping: Goal: Level of anxiety will decrease Outcome: Progressing   Problem: Pain Managment: Goal: General experience of comfort will improve Outcome: Progressing   Problem: Safety: Goal: Ability to remain free from injury will improve Outcome: Progressing

## 2021-01-09 NOTE — Progress Notes (Signed)
PROGRESS NOTE    Richard Pittman  XTK:[id number] DOB: 12-16-1970 DOA: 01/07/2021 PCP: System, Provider Not In  Outpatient Specialists: none    Brief Narrative:   From admission h and p  [name] is a 50 y.o. male with medical history significant for but not limited to diabetes mellitus type 2, obesity as well as other comorbidities who presented to the emergency room with a chief complaint of right foot pain and drainage.  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  His foot started swelling and became infected so he went to see his PCP who put him on Keflex.  Since then he has an increased level of drainage and he so he went to urgent care yesterday and was changed to doxycycline.  He started spiking fevers and chills yesterday but denied chest pain or shortness breath.  He is diabetic.  Because of his fever he came to the ED with significant right lower extremity swelling and erythema.  He states that he does have some pain but denies numbness, tingling, weakness.  No abdominal pain, nausea, vomiting.  [facility] was asked admit this patient for right diabetic foot infection in the setting of nail puncture and an MRI was requested as well as podiatry evaluation.   Assessment & Plan:   Principal Problem:   Foot infection Active Problems:   Uncontrolled type 2 diabetes mellitus with hyperglycemia (HCC)   AKI (acute kidney injury) (HCC)   Metabolic acidosis   Hyponatremia   Microcytic anemia   Obesity (BMI 30-39.9)  # Foot infection # Sepsis Severe. Stepped on nail 2 weeks ago. Failed outpatient keflex>doxy. Here sig swelling, now starting to drain, mri shows abscess. No retained foreign body on imaging. Received tetanus vaccine as outpt. Sepsis by leukocytosis, hr. Clinically stable. Podiatry procedure yesterday, not not completed but appears was I & D, debridement. Patient says  podiatry came and saw today, plan is f/u tomorrow, possible OR tomorrow - cont vanc/cefepime/flagyl - f/u podiatry recs when available - npo @ midnight  # T2DM A1c 8.1.  - hold home glipizide and metformin - SSI - add lantus 5 qhs  # Anemia Mild, microcytic, h 10. No bleeding - monitor for now  # AKI vs ckd Cr 1.85 on presentation, 1.27 after fluids. Normal kidney function 3 years ago, no more recents to compare - monitor for now - cont ivf  DVT prophylaxis: heparin Code Status: full Family Communication: wife updated @ bedside 11/23. Son updated telephonically 11/24  Level of care: Telemetry Status is: Inpatient  Remains inpatient appropriate because: severity of illness        Consultants:  podiatry  Procedures: none  Antimicrobials:  Vanc/cefepime 11/22 Vanc/cefepime/flagyl 11/23>    Subjective: Mild foot pain otherwise feels well, tolerating diet  Objective: Vitals:   01/08/21 1727 01/08/21 2026 01/09/21 0356 01/09/21 0407  BP:  (!) 125/59 (!) 145/67   Pulse:  100 94   Resp:  16 20   Temp: 100.1 F (37.8 C) (!) 100.9 F (38.3 C) 99.3 F (37.4 C)   TempSrc: Oral Oral Oral   SpO2:  97% 98%   Weight:    103 kg  Height:        Intake/Output Summary (Last 24 hours) at 01/09/2021 1244 Last data filed at 01/09/2021 1027 Gross per 24 hour  Intake 3214.33 ml  Output --  Net  3214.33 ml   Filed Weights   01/07/21 1653 01/08/21 1140 01/09/21 0407  Weight: 99.8 kg 100.2 kg 103 kg    Examination:  General exam: Appears calm and comfortable  Respiratory system: Clear to auscultation. Respiratory effort normal. Cardiovascular system: S1 & S2 heard, RRR. No JVD, murmurs, rubs, gallops or clicks. No pedal edema. Gastrointestinal system: Abdomen is nondistended, soft and nontender. No organomegaly or masses felt. Normal bowel sounds heard. Central nervous system: Alert and oriented. No focal neurological deficits. Extremities: sig swelling of right  foot, erythema distally, it is wrapped, toes are purple and swollen, warm Skin: no rashes Psychiatry: Judgement and insight appear normal. Mood & affect appropriate.     Data Reviewed: I have personally reviewed following labs and imaging studies  CBC: Recent Labs  Lab 01/06/21 1215 01/07/21 1700 01/08/21 0453  WBC 14.1* 14.7* 14.0*  NEUTROABS 12.1* 12.6*  --   HGB 11.6* 10.6* 10.0*  HCT 35.8* 31.9* 30.4*  MCV 79.0* 78.0* 78.6*  PLT 257 256 235   Basic Metabolic Panel: Recent Labs  Lab 01/06/21 1215 01/07/21 1700 01/08/21 0453  NA 131* 131* 133*  K 5.1 4.8 4.6  CL 99 101 105  CO2 23 20* 20*  GLUCOSE 402* 343* 223*  BUN 19 33* 29*  CREATININE 1.49* 1.85* 1.27*  CALCIUM 8.5* 7.8* 7.9*   GFR: Estimated Creatinine Clearance: 80 mL/min (A) (by C-G formula based on SCr of 1.27 mg/dL (H)). Liver Function Tests: Recent Labs  Lab 01/06/21 1215 01/07/21 1700 01/08/21 0453  AST 15 12* 15  ALT 17 15 16   ALKPHOS 91 82 82  BILITOT 0.6 0.5 0.6  PROT 6.6 6.6 6.2*  ALBUMIN 2.9* 3.0* 2.6*   No results for input(s): LIPASE, AMYLASE in the last 168 hours. No results for input(s): AMMONIA in the last 168 hours. Coagulation Profile: No results for input(s): INR, PROTIME in the last 168 hours. Cardiac Enzymes: No results for input(s): CKTOTAL, CKMB, CKMBINDEX, TROPONINI in the last 168 hours. BNP (last 3 results) No results for input(s): PROBNP in the last 8760 hours. HbA1C: Recent Labs    01/08/21 0453  HGBA1C 8.1*   CBG: Recent Labs  Lab 01/08/21 1352 01/08/21 1644 01/08/21 2134 01/09/21 0748 01/09/21 1137  GLUCAP 151* 158* 181* 201* 222*   Lipid Profile: No results for input(s): CHOL, HDL, LDLCALC, TRIG, CHOLHDL, LDLDIRECT in the last 72 hours. Thyroid Function Tests: Recent Labs    01/07/21 2021  TSH 3.793   Anemia Panel: No results for input(s): VITAMINB12, FOLATE, FERRITIN, TIBC, IRON, RETICCTPCT in the last 72 hours. Urine analysis:    Component  Value Date/Time   COLORURINE YELLOW 07/17/2017 1259   APPEARANCEUR CLEAR 07/17/2017 1259   LABSPEC 1.032 (H) 07/17/2017 1259   PHURINE 5.0 07/17/2017 1259   GLUCOSEU >=500 (A) 07/17/2017 1259   HGBUR SMALL (A) 07/17/2017 1259   BILIRUBINUR NEGATIVE 07/17/2017 1259   KETONESUR 20 (A) 07/17/2017 1259   PROTEINUR >=300 (A) 07/17/2017 1259   NITRITE NEGATIVE 07/17/2017 1259   LEUKOCYTESUR NEGATIVE 07/17/2017 1259   Sepsis Labs: @LABRCNTIP (procalcitonin:4,lacticidven:4)  ) Recent Results (from the past 240 hour(s))  Resp Panel by RT-PCR (Flu A&B, Covid) Nasopharyngeal Swab     Status: None   Collection Time: 01/07/21  5:50 PM   Specimen: Nasopharyngeal Swab; Nasopharyngeal(NP) swabs in vial transport medium  Result Value Ref Range Status   SARS Coronavirus 2 by RT PCR NEGATIVE NEGATIVE Final    Comment: (NOTE) SARS-CoV-2 target nucleic acids are  NOT DETECTED.  The SARS-CoV-2 RNA is generally detectable in upper respiratory specimens during the acute phase of infection. The lowest concentration of SARS-CoV-2 viral copies this assay can detect is 138 copies/mL. A negative result does not preclude SARS-Cov-2 infection and should not be used as the sole basis for treatment or other patient management decisions. A negative result may occur with  improper specimen collection/handling, submission of specimen other than nasopharyngeal swab, presence of viral mutation(s) within the areas targeted by this assay, and inadequate number of viral copies(<138 copies/mL). A negative result must be combined with clinical observations, patient history, and epidemiological information. The expected result is Negative.  Fact Sheet for Patients:  BloggerCourse.com  Fact Sheet for Healthcare Providers:  SeriousBroker.it  This test is no t yet approved or cleared by the [place] FDA and  has been authorized for detection and/or diagnosis of  SARS-CoV-2 by FDA under an Emergency Use Authorization (EUA). This EUA will remain  in effect (meaning this test can be used) for the duration of the COVID-19 declaration under Section 564(b)(1) of the Act, 21 U.S.C.section 360bbb-3(b)(1), unless the authorization is terminated  or revoked sooner.       Influenza A by PCR NEGATIVE NEGATIVE Final   Influenza B by PCR NEGATIVE NEGATIVE Final    Comment: (NOTE) The Xpert Xpress SARS-CoV-2/FLU/RSV plus assay is intended as an aid in the diagnosis of influenza from Nasopharyngeal swab specimens and should not be used as a sole basis for treatment. Nasal washings and aspirates are unacceptable for Xpert Xpress SARS-CoV-2/FLU/RSV testing.  Fact Sheet for Patients: BloggerCourse.com  Fact Sheet for Healthcare Providers: SeriousBroker.it  This test is not yet approved or cleared by the [place] FDA and has been authorized for detection and/or diagnosis of SARS-CoV-2 by FDA under an Emergency Use Authorization (EUA). This EUA will remain in effect (meaning this test can be used) for the duration of the COVID-19 declaration under Section 564(b)(1) of the Act, 21 U.S.C. section 360bbb-3(b)(1), unless the authorization is terminated or revoked.  Performed at [facility], [address], [place], [place] [address]   Blood culture (routine x 2)     Status: None (Preliminary result)   Collection Time: 01/07/21  6:30 PM   Specimen: Left Antecubital; Blood  Result Value Ref Range Status   Specimen Description   Final    LEFT ANTECUBITAL Performed at [facility], [address] [place], [place] [address]    Special Requests   Final    BOTTLES DRAWN AEROBIC AND ANAEROBIC Blood Culture adequate volume Performed at [facility], [address], [place], [place] [address]    Culture   Final    NO GROWTH < 12 HOURS Performed at  [facility], [address] [place], [place] [address]    Report Status PENDING  Incomplete  Blood culture (routine x 2)     Status: None (Preliminary result)   Collection Time: 01/07/21  6:34 PM   Specimen: Right Antecubital; Blood  Result Value Ref Range Status   Specimen Description   Final    RIGHT ANTECUBITAL Performed at [facility], [address], [place], [place] [address]    Special Requests   Final    BOTTLES DRAWN AEROBIC AND ANAEROBIC Blood Culture adequate volume Performed at [facility], [address], [place], [place] [address]    Culture   Final    NO GROWTH < 12 HOURS  Performed at [facility], [address], [place], [place] [address]    Report Status PENDING  Incomplete  Aerobic/Anaerobic Culture w Gram Stain (surgical/deep wound)     Status: None (Preliminary result)   Collection Time: 01/08/21  1:27 PM   Specimen: PATH Soft tissue  Result Value Ref Range Status   Specimen Description   Final    TISSUE RIGHT FOOT Performed at [facility], [address] [place], [place] [address]    Special Requests   Final    NONE Performed at [facility], [address] [place], [place] [address]    Gram Stain PENDING  Incomplete   Culture   Final    MODERATE STREPTOCOCCUS AGALACTIAE TESTING AGAINST S. AGALACTIAE NOT ROUTINELY PERFORMED DUE TO PREDICTABILITY OF AMP/PEN/VAN SUSCEPTIBILITY. Performed at [facility], [address], [place], [place] [address]    Report Status PENDING  Incomplete         Radiology Studies: MR FOOT RIGHT WO CONTRAST  Result Date: 01/07/2021 CLINICAL DATA:  Right foot pain and swelling.  Stepped on a nail. EXAM: MRI OF THE RIGHT FOREFOOT WITHOUT CONTRAST TECHNIQUE: Multiplanar, multisequence MR imaging of the right forefoot was performed. No intravenous contrast was administered. COMPARISON:  Right foot x-rays from same day. FINDINGS:  Bones/Joint/Cartilage No marrow signal abnormality. No fracture or dislocation. Joint spaces are preserved. No joint effusion. Ligaments Collateral ligaments are intact.  Lisfranc ligament is intact. Muscles and Tendons Flexor and extensor tendons are intact. No tenosynovitis. Increased T2 signal within the intrinsic muscles of the forefoot, nonspecific, but likely related to diabetic muscle changes. Soft tissue Severe diffuse soft tissue swelling of the right foot. Puncture wound noted at the plantar base of the second proximal phalanx with the regular 2.2 x 4.6 x 2.9 cm fluid collection surrounding the second through fourth proximal phalanges (series 6, image 9; series 8, image 13). Smaller small 8 mm fluid collection at the lateral plantar base of the fifth metatarsal head, likely an adventitial bursa. No soft tissue mass. IMPRESSION: 1. Puncture wound at the plantar base of the second proximal phalanx with 2.2 x 4.6 x 2.9 cm abscess surrounding the second through fourth proximal phalanges. No osteomyelitis. 2. Severe right foot cellulitis. Electronically Signed   By: Obie Dredge M.D.   On: 01/07/2021 22:19   DG Foot Complete Right  Result Date: 01/07/2021 CLINICAL DATA:  Wound infection EXAM: RIGHT FOOT COMPLETE - 3+ VIEW COMPARISON:  01/06/2021 FINDINGS: No fracture or malalignment. No radiopaque foreign body. Increased plantar soft tissue swelling without emphysema. No periostitis or osseous destructive change. There are vascular calcifications. IMPRESSION: Increasing plantar soft tissue swelling. No acute osseous abnormality. Electronically Signed   By: Jasmine Pang M.D.   On: 01/07/2021 16:56        Scheduled Meds:  docusate sodium  100 mg Oral BID   heparin  5,000 Units Subcutaneous Q8H   insulin aspart  0-15 Units Subcutaneous TID WC   insulin aspart  0-5 Units Subcutaneous QHS   Continuous Infusions:  sodium chloride 75 mL/hr at 01/09/21 1027   ceFEPime (MAXIPIME) IV 2 g (01/09/21  0606)   metronidazole 500 mg (01/09/21 0926)   vancomycin 1,250 mg (01/08/21 2028)     LOS: 2 days    Time spent: 35 min    Silvano Bilis, MD [facility]   If 7PM-7AM, please contact night-coverage [email or web address] Password [facility] 01/09/2021, 12:44 PM

## 2021-01-09 NOTE — Progress Notes (Signed)
  Subjective:  Patient ID: Richard Pittman, male    DOB: 07/10/1970,  MRN: [id number]  Patient seen bedside. Pain controlled to foot. Has questions about the next plan for surgery.  Interpreter used for assistance in translation. Objective:   Vitals:   01/08/21 2026 01/09/21 0356  BP: (!) 125/59 (!) 145/67  Pulse: 100 94  Resp: 16 20  Temp: (!) 100.9 F (38.3 C) 99.3 F (37.4 C)  SpO2: 97% 98%   General AA&O x3. Normal mood and affect.  Vascular Dorsalis pedis and posterior tibial pulses 2/4 bilat. Brisk capillary refill to all digits. Pedal hair present.  Neurologic Epicritic sensation grossly intact. Sensation intact 3rd digit, absent 4th digit  Dermatologic Dressing c/d/I. Duskiness to the 3rd/4th digits, 4th>3rd  Orthopedic: +Motor to foot    Assessment & Plan:  Patient was evaluated and treated and all questions answered.  Puncture wound right foot with abscess -Dressing left intact.  -3rd toe appears slightly dusky, 4th toe moreso. No sensation to 4th toe. Unfortuantely I think this toe is unlikely to be salvageable. The third however maybe salvageable. -Heel WB RLE -Planned RTOR 11/25 for repeat debridement possible 3rd/4th toe amputation -Continue empiric abx. Micro pending -Will continue to follow  Park Liter, DPM  Accessible via secure chat for questions or concerns.

## 2021-01-10 ENCOUNTER — Telehealth: Payer: Self-pay | Admitting: Podiatry

## 2021-01-10 ENCOUNTER — Encounter (HOSPITAL_COMMUNITY)
Admission: EM | Disposition: A | Discharge status: Home / Self Care | Payer: Self-pay | Source: Home / Self Care | Attending: Obstetrics and Gynecology

## 2021-01-10 ENCOUNTER — Inpatient Hospital Stay (HOSPITAL_COMMUNITY): Payer: Self-pay | Admitting: Anesthesiology

## 2021-01-10 ENCOUNTER — Inpatient Hospital Stay (HOSPITAL_COMMUNITY): Payer: Self-pay

## 2021-01-10 ENCOUNTER — Encounter (HOSPITAL_COMMUNITY): Payer: Self-pay | Admitting: Podiatry

## 2021-01-10 DIAGNOSIS — I96 Gangrene, not elsewhere classified: Secondary | ICD-10-CM

## 2021-01-10 DIAGNOSIS — L089 Local infection of the skin and subcutaneous tissue, unspecified: Secondary | ICD-10-CM

## 2021-01-10 HISTORY — PX: AMPUTATION TOE: SHX6595

## 2021-01-10 HISTORY — PX: WOUND DEBRIDEMENT: SHX247

## 2021-01-10 LAB — CBC
HCT: 29.2 % — ABNORMAL LOW (ref 39.0–52.0)
Hemoglobin: 9.5 g/dL — ABNORMAL LOW (ref 13.0–17.0)
MCH: 26 pg (ref 26.0–34.0)
MCHC: 32.5 g/dL (ref 30.0–36.0)
MCV: 80 fL (ref 80.0–100.0)
Platelets: 282 10*3/uL (ref 150–400)
RBC: 3.65 MIL/uL — ABNORMAL LOW (ref 4.22–5.81)
RDW: 13.6 % (ref 11.5–15.5)
WBC: 14.2 10*3/uL — ABNORMAL HIGH (ref 4.0–10.5)
nRBC: 0 % (ref 0.0–0.2)

## 2021-01-10 LAB — BASIC METABOLIC PANEL
Anion gap: 7 (ref 5–15)
BUN: 21 mg/dL — ABNORMAL HIGH (ref 6–20)
CO2: 18 mmol/L — ABNORMAL LOW (ref 22–32)
Calcium: 7.9 mg/dL — ABNORMAL LOW (ref 8.9–10.3)
Chloride: 106 mmol/L (ref 98–111)
Creatinine, Ser: 0.96 mg/dL (ref 0.61–1.24)
GFR, Estimated: >60 mL/min (ref >60)
Glucose, Bld: 211 mg/dL — ABNORMAL HIGH (ref 70–99)
Potassium: 4.6 mmol/L (ref 3.5–5.1)
Sodium: 131 mmol/L — ABNORMAL LOW (ref 135–145)

## 2021-01-10 LAB — GLUCOSE, CAPILLARY
Glucose-Capillary: 219 mg/dL — ABNORMAL HIGH (ref 70–99)
Glucose-Capillary: 154 mg/dL — ABNORMAL HIGH (ref 70–99)
Glucose-Capillary: 140 mg/dL — ABNORMAL HIGH (ref 70–99)
Glucose-Capillary: 138 mg/dL — ABNORMAL HIGH (ref 70–99)
Glucose-Capillary: 145 mg/dL — ABNORMAL HIGH (ref 70–99)

## 2021-01-10 IMAGING — DX DG FOOT 2V*R*
2 series · 2 of 2 positions shown · non-contrast
Comparison: None.

CLINICAL DATA: Status post right fourth digit amputation.

EXAM:
RIGHT FOOT - 2 VIEW

[foot ap]
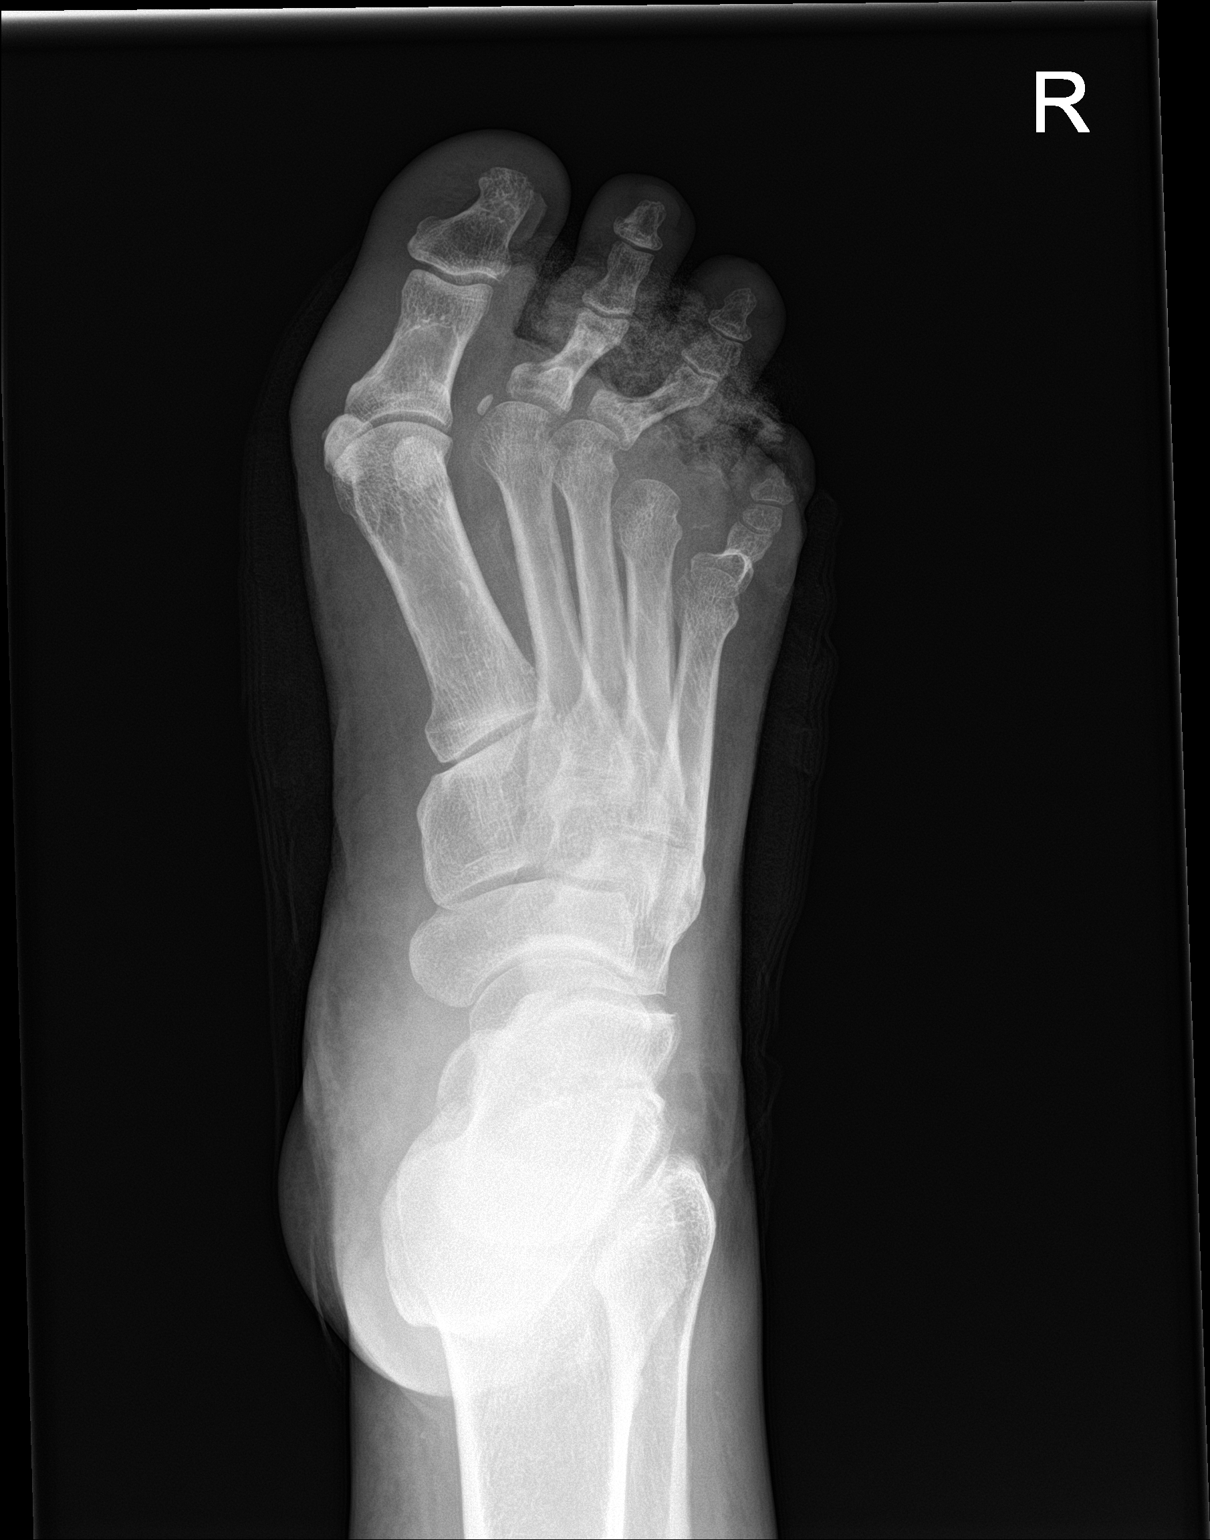

[foot lat]
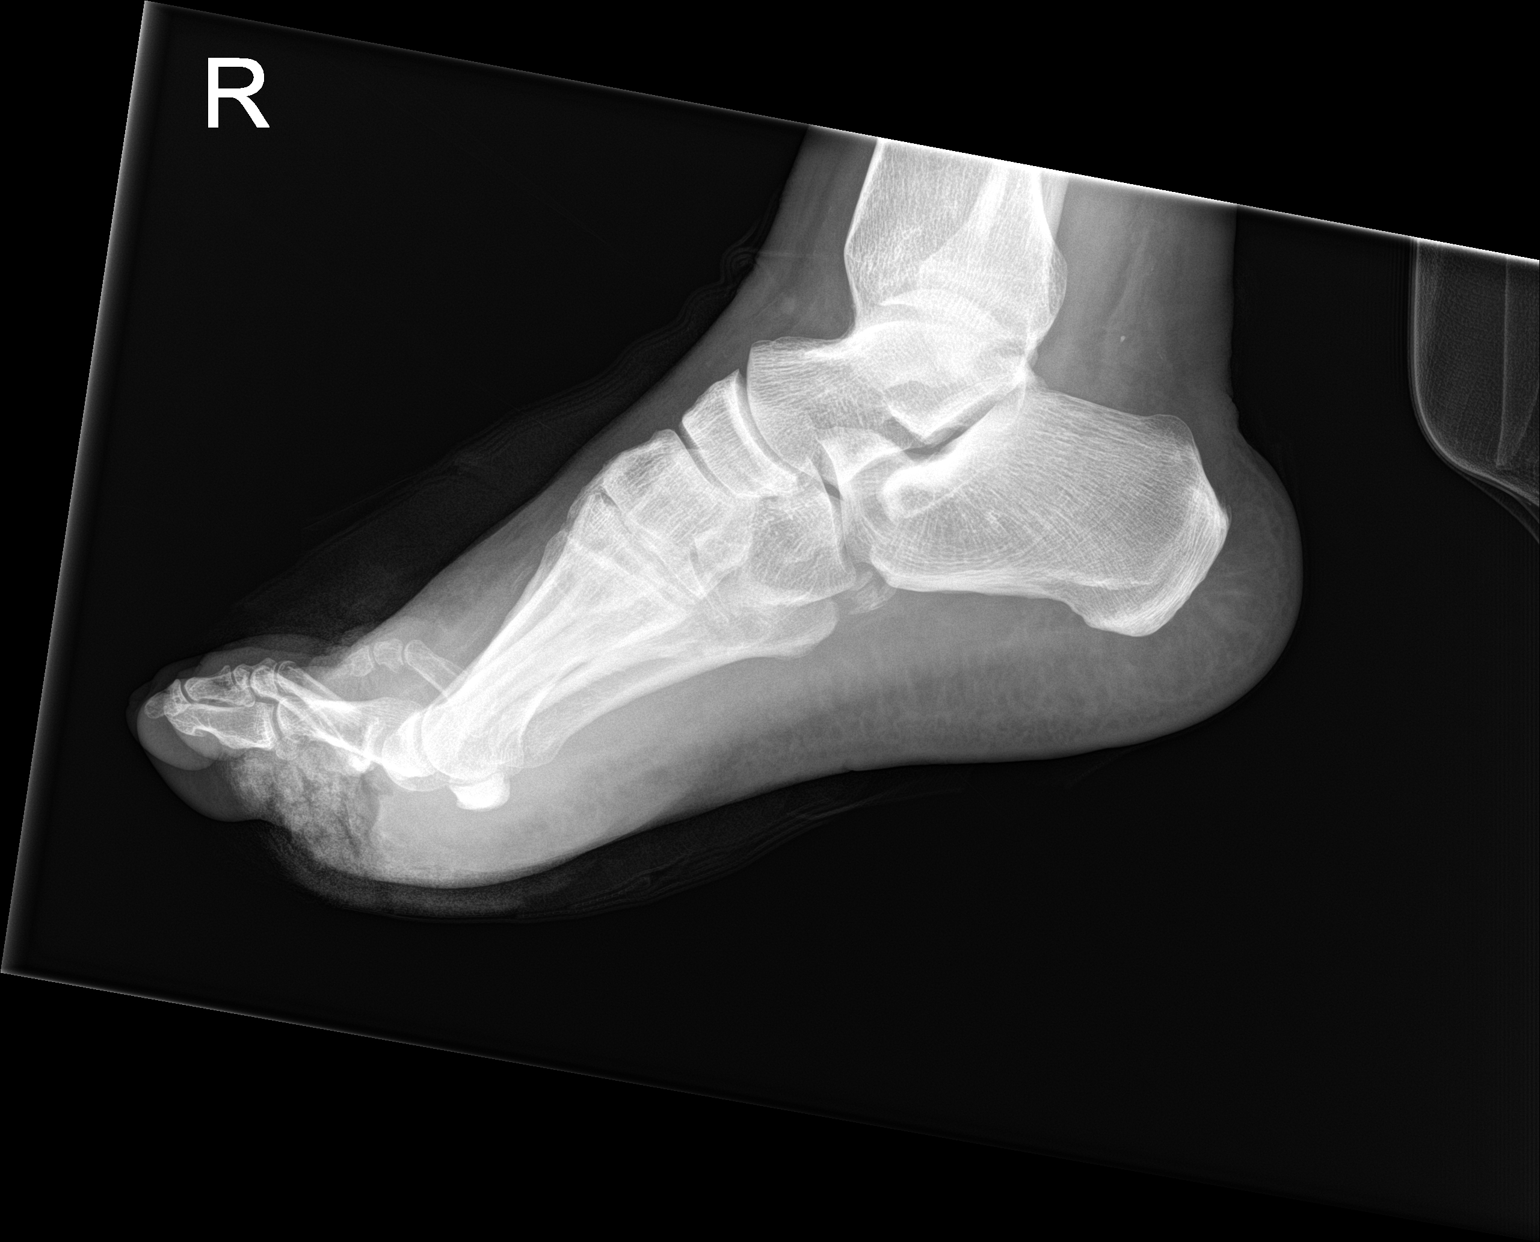

[2 of 2 positions shown; findings below may reference images not displayed]

FINDINGS: Status post amputation of the fourth phalanges. There is no acute
fracture or dislocation. The soft tissue swelling of the foot.
Dressing noted over the forefoot.
IMPRESSION: Status post amputation of the fourth phalanges.

## 2021-01-10 SURGERY — DEBRIDEMENT, WOUND
Anesthesia: Monitor Anesthesia Care | Site: Toe | Laterality: Right

## 2021-01-10 MED ORDER — PROPOFOL 1000 MG/100ML IV EMUL
INTRAVENOUS | Status: AC
  Filled 2021-01-10: qty 100

## 2021-01-10 MED ORDER — LACTATED RINGERS IV SOLN: INTRAVENOUS | Status: DC

## 2021-01-10 MED ORDER — VANCOMYCIN HCL 1000 MG IV SOLR
INTRAVENOUS | Status: AC
  Filled 2021-01-10: qty 20

## 2021-01-10 MED ORDER — INSULIN GLARGINE-YFGN 100 UNIT/ML ~~LOC~~ SOLN
10.0000 [IU] | Freq: Every day | SUBCUTANEOUS | Status: DC
  Administered 2021-01-10: 10 [IU] via SUBCUTANEOUS
  Filled 2021-01-10 (×2): qty 0.1

## 2021-01-10 MED ORDER — FENTANYL CITRATE (PF) 100 MCG/2ML IJ SOLN
INTRAMUSCULAR | Status: AC
  Filled 2021-01-10: qty 2

## 2021-01-10 MED ORDER — PROPOFOL 500 MG/50ML IV EMUL
INTRAVENOUS | Status: DC | PRN
  Administered 2021-01-10: 100 ug/kg/min via INTRAVENOUS

## 2021-01-10 MED ORDER — 0.9 % SODIUM CHLORIDE (POUR BTL) OPTIME
TOPICAL | Status: DC | PRN
  Administered 2021-01-10: 1000 mL

## 2021-01-10 MED ORDER — FENTANYL CITRATE (PF) 100 MCG/2ML IJ SOLN
INTRAMUSCULAR | Status: DC | PRN
  Administered 2021-01-10: 50 ug via INTRAVENOUS

## 2021-01-10 MED ORDER — SODIUM CHLORIDE 0.9 % IV SOLN
3.0000 g | Freq: Four times a day (QID) | INTRAVENOUS | Status: DC
  Administered 2021-01-10 – 2021-01-11 (×5): 3 g via INTRAVENOUS
  Filled 2021-01-10 (×6): qty 8

## 2021-01-10 MED ORDER — BUPIVACAINE HCL (PF) 0.5 % IJ SOLN
INTRAMUSCULAR | Status: DC | PRN
  Administered 2021-01-10: 10 mL

## 2021-01-10 MED ORDER — SODIUM CHLORIDE 0.9 % IR SOLN
Status: DC | PRN
  Administered 2021-01-10: 3000 mL

## 2021-01-10 MED ORDER — SODIUM CHLORIDE 0.9 % IV BOLUS
500.0000 mL | Freq: Once | INTRAVENOUS | Status: AC
  Administered 2021-01-10: 500 mL via INTRAVENOUS

## 2021-01-10 MED ORDER — CHLORHEXIDINE GLUCONATE 0.12 % MT SOLN
15.0000 mL | Freq: Once | OROMUCOSAL | Status: AC
  Administered 2021-01-10: 15 mL via OROMUCOSAL

## 2021-01-10 MED ORDER — MIDAZOLAM HCL 5 MG/5ML IJ SOLN
INTRAMUSCULAR | Status: DC | PRN
  Administered 2021-01-10: 2 mg via INTRAVENOUS

## 2021-01-10 MED ORDER — MIDAZOLAM HCL 2 MG/2ML IJ SOLN
INTRAMUSCULAR | Status: AC
  Filled 2021-01-10: qty 2

## 2021-01-10 MED ORDER — COLLAGENASE 250 UNIT/GM EX OINT
TOPICAL_OINTMENT | Freq: Every day | CUTANEOUS | Status: DC
  Filled 2021-01-10: qty 30

## 2021-01-10 SURGICAL SUPPLY — 78 items
BAG COUNTER SPONGE SURGICOUNT (BAG) IMPLANT
BLADE AVERAGE 25X9 (BLADE) IMPLANT
BLADE HEX COATED 2.75 (ELECTRODE) ×3 IMPLANT
BLADE OSC/SAG .038X5.5 CUT EDG (BLADE) IMPLANT
BLADE OSCILLATING/SAGITTAL (BLADE)
BLADE SURG 15 STRL LF DISP TIS (BLADE) ×2 IMPLANT
BLADE SURG 15 STRL SS (BLADE) ×1
BLADE SW THK.38XMED LNG THN (BLADE) IMPLANT
BNDG CONFORM 4 STRL LF (GAUZE/BANDAGES/DRESSINGS) ×3 IMPLANT
BNDG ELASTIC 3X5.8 VLCR STR LF (GAUZE/BANDAGES/DRESSINGS) ×3 IMPLANT
BNDG ELASTIC 4X5.8 VLCR STR LF (GAUZE/BANDAGES/DRESSINGS) IMPLANT
BNDG ELASTIC 6X5.8 VLCR STR LF (GAUZE/BANDAGES/DRESSINGS) ×3 IMPLANT
BNDG ESMARK 4X9 LF (GAUZE/BANDAGES/DRESSINGS) ×3 IMPLANT
BNDG GAUZE ELAST 4 BULKY (GAUZE/BANDAGES/DRESSINGS) ×3 IMPLANT
CHLORAPREP W/TINT 26 (MISCELLANEOUS) ×3 IMPLANT
CLEANER TIP ELECTROSURG 2X2 (MISCELLANEOUS) IMPLANT
CNTNR URN SCR LID CUP LEK RST (MISCELLANEOUS) IMPLANT
CONT SPEC 4OZ STRL OR WHT (MISCELLANEOUS)
COVER BACK TABLE 60X90IN (DRAPES) ×3 IMPLANT
COVER SURGICAL LIGHT HANDLE (MISCELLANEOUS) ×3 IMPLANT
CUFF TOURN SGL QUICK 18 (TOURNIQUET CUFF) IMPLANT
CUFF TOURN SGL QUICK 18X4 (TOURNIQUET CUFF) IMPLANT
DECANTER SPIKE VIAL GLASS SM (MISCELLANEOUS) IMPLANT
DRAPE EXTREMITY T 121X128X90 (DISPOSABLE) ×3 IMPLANT
DRAPE IMP U-DRAPE 54X76 (DRAPES) ×3 IMPLANT
DRAPE U-SHAPE 47X51 STRL (DRAPES) ×3 IMPLANT
DRSG EMULSION OIL 3X3 NADH (GAUZE/BANDAGES/DRESSINGS) ×3 IMPLANT
DRSG PAD ABDOMINAL 8X10 ST (GAUZE/BANDAGES/DRESSINGS) IMPLANT
ELECT REM PT RETURN 15FT ADLT (MISCELLANEOUS) ×3 IMPLANT
GAUZE 4X4 16PLY ~~LOC~~+RFID DBL (SPONGE) IMPLANT
GAUZE SPONGE 4X4 12PLY STRL (GAUZE/BANDAGES/DRESSINGS) ×3 IMPLANT
GAUZE XEROFORM 1X8 LF (GAUZE/BANDAGES/DRESSINGS) ×3 IMPLANT
GAUZE XEROFORM 5X9 LF (GAUZE/BANDAGES/DRESSINGS) IMPLANT
GLOVE SRG 8 PF TXTR STRL LF DI (GLOVE) ×2 IMPLANT
GLOVE SURG ENC MOIS LTX SZ7.5 (GLOVE) ×3 IMPLANT
GLOVE SURG ENC MOIS LTX SZ8 (GLOVE) ×3 IMPLANT
GLOVE SURG NEOPR MICRO LF SZ8 (GLOVE) ×3 IMPLANT
GLOVE SURG UNDER POLY LF SZ8 (GLOVE) ×1
GOWN L4 XXLG W/PAP TWL (GOWN DISPOSABLE) ×3 IMPLANT
GOWN STRL REUS W/ TWL LRG LVL3 (GOWN DISPOSABLE) ×2 IMPLANT
GOWN STRL REUS W/TWL LRG LVL3 (GOWN DISPOSABLE) ×1
GOWN STRL REUS W/TWL XL LVL3 (GOWN DISPOSABLE) ×6 IMPLANT
HANDPIECE INTERPULSE COAX TIP (DISPOSABLE) ×1
KIT BASIN OR (CUSTOM PROCEDURE TRAY) ×3 IMPLANT
KIT TURNOVER KIT A (KITS) IMPLANT
MANIFOLD NEPTUNE II (INSTRUMENTS) ×3 IMPLANT
NEEDLE HYPO 25X1 1.5 SAFETY (NEEDLE) ×3 IMPLANT
NS IRRIG 1000ML POUR BTL (IV SOLUTION) ×3 IMPLANT
PACK ORTHO EXTREMITY (CUSTOM PROCEDURE TRAY) ×3 IMPLANT
PADDING CAST ABS 4INX4YD NS (CAST SUPPLIES) ×1
PADDING CAST ABS COTTON 4X4 ST (CAST SUPPLIES) ×2 IMPLANT
PADDING UNDERCAST 2 STRL (CAST SUPPLIES) ×1
PADDING UNDERCAST 2X4 STRL (CAST SUPPLIES) ×2 IMPLANT
PENCIL SMOKE EVACUATOR (MISCELLANEOUS) ×3 IMPLANT
SET HNDPC FAN SPRY TIP SCT (DISPOSABLE) ×2 IMPLANT
SET IRRIG Y TYPE TUR BLADDER L (SET/KITS/TRAYS/PACK) ×3 IMPLANT
SLEEVE SCD COMPRESS KNEE MED (STOCKING) ×3 IMPLANT
SPONGE SURGIFOAM ABS GEL 100 (HEMOSTASIS) IMPLANT
STAPLER VISISTAT 35W (STAPLE) ×3 IMPLANT
STOCKINETTE 8 INCH (MISCELLANEOUS) ×6 IMPLANT
SUT ETHILON 3 0 PS 1 (SUTURE) IMPLANT
SUT ETHILON 4 0 PS 2 18 (SUTURE) ×3 IMPLANT
SUT MNCRL 4-0 (SUTURE) ×1
SUT MNCRL 4-0 27XMFL (SUTURE) ×2
SUT MNCRL AB 3-0 PS2 18 (SUTURE) IMPLANT
SUT MNCRL AB 4-0 PS2 18 (SUTURE) IMPLANT
SUT MON AB 5-0 PS2 18 (SUTURE) IMPLANT
SUT VIC AB 3-0 FS2 27 (SUTURE) ×3 IMPLANT
SUT VIC AB 4-0 PS2 18 (SUTURE) ×3 IMPLANT
SUTURE MNCRL 4-0 27XMF (SUTURE) ×2 IMPLANT
SYR 20ML LL LF (SYRINGE) IMPLANT
SYR BULB EAR ULCER 3OZ GRN STR (SYRINGE) ×3 IMPLANT
SYR CONTROL 10ML LL (SYRINGE) ×3 IMPLANT
TOWEL OR 17X26 10 PK STRL BLUE (TOWEL DISPOSABLE) ×3 IMPLANT
TOWEL OR NON WOVEN STRL DISP B (DISPOSABLE) ×3 IMPLANT
TRAY PREP A LATEX SAFE STRL (SET/KITS/TRAYS/PACK) ×3 IMPLANT
UNDERPAD 30X36 HEAVY ABSORB (UNDERPADS AND DIAPERS) ×6 IMPLANT
YANKAUER SUCT BULB TIP NO VENT (SUCTIONS) ×3 IMPLANT

## 2021-01-10 NOTE — Plan of Care (Signed)

## 2021-01-10 NOTE — Plan of Care (Signed)
Pt aox4 pleasant and cooperative spanish speaking man. iPad at bedside for video interrupter assistance.  For OR today for additional wound debridement and possible amputation.   IV abx per orders, IVF running.   Problem: Coping: Goal: Level of anxiety will decrease Outcome: Progressing   Problem: Pain Managment: Goal: General experience of comfort will improve Outcome: Progressing   Problem: Safety: Goal: Ability to remain free from injury will improve Outcome: Progressing

## 2021-01-10 NOTE — Progress Notes (Signed)
Orthopedic Tech Progress Note Patient Details:  Richard Pittman [facility] 03-20-1970 [id number]  Ortho Devices Type of Ortho Device: CAM walker Ortho Device/Splint Location: Right Foot Ortho Device/Splint Interventions: Application   Post Interventions Patient Tolerated: Well  [name] 01/10/2021, 3:39 PM

## 2021-01-10 NOTE — Anesthesia Preprocedure Evaluation (Addendum)
Anesthesia Evaluation  Patient identified by MRN, date of birth, ID band Patient awake    Reviewed: Allergy & Precautions, NPO status , Patient's Chart, lab work & pertinent test results  Airway Mallampati: III  TM Distance: >3 FB Neck ROM: Full    Dental no notable dental hx.    Pulmonary neg pulmonary ROS,    Pulmonary exam normal        Cardiovascular negative cardio ROS Normal cardiovascular exam     Neuro/Psych negative neurological ROS     GI/Hepatic negative GI ROS, Neg liver ROS,   Endo/Other  diabetes, Type 2, Oral Hypoglycemic Agents  Renal/GU      Musculoskeletal negative musculoskeletal ROS (+)   Abdominal Normal abdominal exam  (+)   Peds  Hematology negative hematology ROS (+)   Anesthesia Other Findings   Reproductive/Obstetrics                            Anesthesia Physical Anesthesia Plan  ASA: 2  Anesthesia Plan: MAC   Post-op Pain Management:    Induction: Intravenous  PONV Risk Score and Plan: 2 and Propofol infusion, Ondansetron and Midazolam  Airway Management Planned: Natural Airway and Simple Face Mask  Additional Equipment: None  Intra-op Plan:   Post-operative Plan:   Informed Consent: I have reviewed the patients History and Physical, chart, labs and discussed the procedure including the risks, benefits and alternatives for the proposed anesthesia with the patient or authorized representative who has indicated his/her understanding and acceptance.       Plan Discussed with: CRNA  Anesthesia Plan Comments: (Lab Results      Component                Value               Date                      WBC                      14.2 (H)            01/10/2021                HGB                      9.5 (L)             01/10/2021                HCT                      29.2 (L)            01/10/2021                MCV                      80.0                 01/10/2021                PLT                      282                 01/10/2021           )  Anesthesia Quick Evaluation  

## 2021-01-10 NOTE — Brief Op Note (Signed)
01/07/2021 - 01/10/2021  3:20 PM  PATIENT:  Eugene Garnet  50 y.o. 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 (Right)  SURGEON:  Surgeon(s) and Role:    * Evelina Bucy, DPM - Primary  PHYSICIAN ASSISTANT:   ASSISTANTS: none   ANESTHESIA:   MAC  EBL:  5 ml   BLOOD ADMINISTERED:none  DRAINS: none   LOCAL MEDICATIONS USED:  MARCAINE    and Amount: 10 ml  SPECIMEN:   ID Type Source Tests Collected by Time Destination  1 : 4th toe amputaion Tissue PATH Other SURGICAL PATHOLOGY Evelina Bucy, DPM 01/10/2021 1507      DISPOSITION OF SPECIMEN:  PATHOLOGY  COUNTS:  YES  TOURNIQUET:  * Missing tourniquet times found for documented tourniquets in log: [id number] *  DICTATION: .Viviann Spare Dictation  PLAN OF CARE: Admit to inpatient   PATIENT DISPOSITION:  PACU - hemodynamically stable.   Delay start of Pharmacological VTE agent (>24hrs) due to surgical blood loss or risk of bleeding: not applicable

## 2021-01-10 NOTE — Progress Notes (Addendum)
Pharmacy Antibiotic Note  Richard Pittman [name] is a 50 y.o. male with a h/o DM admitted on 01/07/2021 with right foot wound.  Pharmacy has been consulted for Unasyn  dosing.  11/22 foot XR: no acute osseous abnormality S/p I&D of wound 11/23 with plans to repeat I&D 11/25 Wound culture from 11/23 with Group B strep and few GNR (only on Gram stain) D/w podiatry - ok to narrow therapy to Unasyn  Plan: Discontinue vancomycin, cefepime, and Flagyl Begin Unasyn 3g IV q6 hours Follow-up wound cultures Pharmacy will formally sign off consult and continue to monitor peripherally  Height: 5\' 8"  (172.7 cm) Weight: 103.1 kg (227 lb 4.7 oz) IBW/kg (Calculated) : 68.4  Temp (24hrs), Avg:98.6 F (37 C), Min:98.6 F (37 C), Max:98.6 F (37 C)  Recent Labs  Lab 01/06/21 1215 01/07/21 1700 01/07/21 1830 01/07/21 2021 01/08/21 0453 01/10/21 0444  WBC 14.1* 14.7*  --   --  14.0* 14.2*  CREATININE 1.49* 1.85*  --   --  1.27* 0.96  LATICACIDVEN  --   --  1.6 1.3  --   --      Estimated Creatinine Clearance: 106 mL/min (by C-G formula based on SCr of 0.96 mg/dL).    No Known Allergies  Antimicrobials this admission: 11/22 vancomycin >> 11/25 11/22 cefepime >> 11/25 11/22 Flagyl >> 11/25 11/25 Unasyn >>  Dose adjustments this admission: Vanc 1000mg  IV q24h >> 1250mg  IV q24h   Microbiology results: 11/22 BCx: ngtd 11/23 Tissue Cx: Group B strep; gram stain with few GNR   Thank you for allowing pharmacy to be a part of this patient's care.  , PharmD 01/10/2021 12:13 PM

## 2021-01-10 NOTE — Transfer of Care (Signed)
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

## 2021-01-10 NOTE — Op Note (Signed)
  Patient Name: Richard Pittman DOB: [birth year]  MRN: [id number]   Date of Surgery: 01/10/21  Surgeon: Dr. 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 male with a severe right foot infection s/p puncture wound. He previously underwent incision and drainage and presents for planned repeat debridement. It was discussed that he may ultimately require amputation of the 3rd or 4th digits as indicated.   Procedure in Detail: Patient was identified in pre-operative holding area. Formal consent was signed and the right lower extremity was marked. Patient was brought back to the operating room. Anesthesia was induced. The extremity was prepped and draped in the usual sterile fashion. Timeout was taken to confirm patient name, laterality, and procedure prior to incision.   Attention was then directed to the right foot. There was a large plantar foot wound with necrotic tissue. There was scant purulence at the wound. The wound was sharply excisionally debrided of non-viable tissue to the level of bleeding tissue.  Attention was then directed to the 3rd and 4th toes. A 15 blade was used to test viability of the distal aspect of the digits. The 3rd toe had immediate bleeding to the distal tip, but the 4th did not. It was determined the 4th toe was not viable. Amputation was indicated.  Attention was then directed to the 4th toe. A fish-mouth  incision was made at hte PIPJ hoping to salvage part of the digit. Unfortunately this did not bleed either. A new fishmouth incision was made at the MPJ which did have healthy bleeding at this level. The incision was completed and the toe disarticulated and sent for pathology.  The wound was further excisionally debrided with 15 blade. The wound was then copiously irrigated with pulse lavage for a total of 3L with normal saline. The wound measured 5x4cm post-debridement. Debridement was carried to and through the fascial layer.  The foot was then dressed with betadine, xeroform, 4x4, kerlix, and ACE bandage. Patient tolerated the procedure well.  Disposition: Following a period of post-operative monitoring, patient will be transferred to the floor. Plan for discharge tomorrow with PO abx.

## 2021-01-10 NOTE — Progress Notes (Signed)
1300 Unasyn dose sent with patient to OR/Pre-op

## 2021-01-10 NOTE — Telephone Encounter (Signed)
Spoke with patient's son Gerilyn Nestle and gave him post-op update. All questions answered.

## 2021-01-10 NOTE — Anesthesia Postprocedure Evaluation (Signed)
Anesthesia Post Note  Patient: Richard Pittman  Procedure(s) Performed: DEBRIDEMENT WOUND (Right) 4TH TOE AMPUTATION (Right: Toe)     Patient location during evaluation: PACU Anesthesia Type: MAC Level of consciousness: awake and alert Pain management: pain level controlled Vital Signs Assessment: post-procedure vital signs reviewed and stable Respiratory status: spontaneous breathing, nonlabored ventilation, respiratory function stable and patient connected to nasal cannula oxygen Cardiovascular status: stable and blood pressure returned to baseline Postop Assessment: no apparent nausea or vomiting Anesthetic complications: no   No notable events documented.  Last Vitals:  Vitals:   01/10/21 1322 01/10/21 1524  BP: (!) 167/82 (!) 143/81  Pulse: 83 90  Resp: 16 15  Temp: 37.1 C 37.2 C  SpO2: 97% 97%    Last Pain:  Vitals:   01/10/21 1524  TempSrc:   PainSc: 0-No pain                 Effie Berkshire

## 2021-01-10 NOTE — Progress Notes (Signed)
PROGRESS NOTE    Richard Pittman  OIZ:[id number] DOB: 1970-12-30 DOA: 01/07/2021 PCP: System, Provider Not In  Outpatient Specialists: none    Brief Narrative:   From admission h and p  Richard Pittman is a 50 y.o. male with medical history significant for but not limited to diabetes mellitus type 2, obesity as well as other comorbidities who presented to the emergency room with a chief complaint of right foot pain and drainage.  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  His foot started swelling and became infected so he went to see his PCP who put him on Keflex.  Since then he has an increased level of drainage and he so he went to urgent care yesterday and was changed to doxycycline.  He started spiking fevers and chills yesterday but denied chest pain or shortness breath.  He is diabetic.  Because of his fever he came to the ED with significant right lower extremity swelling and erythema.  He states that he does have some pain but denies numbness, tingling, weakness.  No abdominal pain, nausea, vomiting.  [facility] was asked admit this patient for right diabetic foot infection in the setting of nail puncture and an MRI was requested as well as podiatry evaluation.   Assessment & Plan:   Principal Problem:   Foot infection Active Problems:   Uncontrolled type 2 diabetes mellitus with hyperglycemia (HCC)   AKI (acute kidney injury) (HCC)   Metabolic acidosis   Hyponatremia   Microcytic anemia   Obesity (BMI 30-39.9)  # Foot infection # Sepsis Severe. Stepped on nail 2 weeks ago. Failed outpatient keflex>doxy. Here sig swelling, now starting to drain, mri shows abscess. No retained foreign body on imaging. Received tetanus vaccine as outpt. Sepsis by leukocytosis, hr. Clinically stable, sepsis physiology resolved  Podiatry procedure 11/23, note not completed but appears was I & D,  debridement. Appears plan is more debridement later today, no podiatry progress note from yesterday but is scheduled in the OR later today - cont vanc/cefepime/flagyl - have messaged podiatry to clarify plan  # T2DM A1c 8.1.  - hold home glipizide and metformin - SSI - added lantus 5 qhs, will increase to 10 as fasting today 211  # Anemia Mild, microcytic, h ~10. No bleeding - monitor for now  # AK Cr 1.85, normalized w/ fluids  DVT prophylaxis: heparin Code Status: full Family Communication: wife updated @ bedside 11/24 Level of care: Telemetry Status is: Inpatient  Remains inpatient appropriate because: severity of illness        Consultants:  podiatry  Procedures: none  Antimicrobials:  Vanc/cefepime 11/22 Vanc/cefepime/flagyl 11/23>    Subjective: Mild foot pain otherwise feels well, tolerating diet  Objective: Vitals:   01/09/21 0407 01/09/21 2119 01/10/21 0500 01/10/21 0619  BP:  (!) 155/71  (!) (P) 164/78  Pulse:  92  (P) 87  Resp:  20    Temp:  98.6 F (37 C)    TempSrc:  Oral    SpO2:  97%  (P) 96%  Weight: 103 kg  103.1 kg   Height:        Intake/Output Summary (Last 24 hours) at 01/10/2021 0927 Last data filed at 01/10/2021 0757 Gross per 24 hour  Intake 1191.32 ml  Output --  Net 1191.32 ml   Filed Weights   01/08/21 1140 01/09/21 0407 01/10/21 0500  Weight: 100.2 kg 103 kg 103.1 kg    Examination:  General exam: Appears calm and comfortable  Respiratory system: Clear to auscultation. Respiratory effort normal. Cardiovascular system: S1 & S2 heard, RRR. No JVD, murmurs, rubs, gallops or clicks. No pedal edema. Gastrointestinal system: Abdomen is nondistended, soft and nontender. No organomegaly or masses felt. Normal bowel sounds heard. Central nervous system: Alert and oriented. No focal neurological deficits. Extremities: sig swelling of right foot, erythema distally, it is wrapped, toes are purple and swollen, warm Skin: no  rashes Psychiatry: Judgement and insight appear normal. Mood & affect appropriate.     Data Reviewed: I have personally reviewed following labs and imaging studies  CBC: Recent Labs  Lab 01/06/21 1215 01/07/21 1700 01/08/21 0453 01/10/21 0444  WBC 14.1* 14.7* 14.0* 14.2*  NEUTROABS 12.1* 12.6*  --   --   HGB 11.6* 10.6* 10.0* 9.5*  HCT 35.8* 31.9* 30.4* 29.2*  MCV 79.0* 78.0* 78.6* 80.0  PLT 257 256 235 282   Basic Metabolic Panel: Recent Labs  Lab 01/06/21 1215 01/07/21 1700 01/08/21 0453 01/10/21 0444  NA 131* 131* 133* 131*  K 5.1 4.8 4.6 4.6  CL 99 101 105 106  CO2 23 20* 20* 18*  GLUCOSE 402* 343* 223* 211*  BUN 19 33* 29* 21*  CREATININE 1.49* 1.85* 1.27* 0.96  CALCIUM 8.5* 7.8* 7.9* 7.9*   GFR: Estimated Creatinine Clearance: 106 mL/min (by C-G formula based on SCr of 0.96 mg/dL). Liver Function Tests: Recent Labs  Lab 01/06/21 1215 01/07/21 1700 01/08/21 0453  AST 15 12* 15  ALT 17 15 16   ALKPHOS 91 82 82  BILITOT 0.6 0.5 0.6  PROT 6.6 6.6 6.2*  ALBUMIN 2.9* 3.0* 2.6*   No results for input(s): LIPASE, AMYLASE in the last 168 hours. No results for input(s): AMMONIA in the last 168 hours. Coagulation Profile: No results for input(s): INR, PROTIME in the last 168 hours. Cardiac Enzymes: No results for input(s): CKTOTAL, CKMB, CKMBINDEX, TROPONINI in the last 168 hours. BNP (last 3 results) No results for input(s): PROBNP in the last 8760 hours. HbA1C: Recent Labs    01/08/21 0453  HGBA1C 8.1*   CBG: Recent Labs  Lab 01/09/21 0748 01/09/21 1137 01/09/21 1707 01/09/21 2120 01/10/21 0741  GLUCAP 201* 222* 203* 159* 219*   Lipid Profile: No results for input(s): CHOL, HDL, LDLCALC, TRIG, CHOLHDL, LDLDIRECT in the last 72 hours. Thyroid Function Tests: Recent Labs    01/07/21 2021  TSH 3.793   Anemia Panel: No results for input(s): VITAMINB12, FOLATE, FERRITIN, TIBC, IRON, RETICCTPCT in the last 72 hours. Urine analysis:     Component Value Date/Time   COLORURINE YELLOW 07/17/2017 1259   APPEARANCEUR CLEAR 07/17/2017 1259   LABSPEC 1.032 (H) 07/17/2017 1259   PHURINE 5.0 07/17/2017 1259   GLUCOSEU >=500 (A) 07/17/2017 1259   HGBUR SMALL (A) 07/17/2017 1259   BILIRUBINUR NEGATIVE 07/17/2017 1259   KETONESUR 20 (A) 07/17/2017 1259   PROTEINUR >=300 (A) 07/17/2017 1259   NITRITE NEGATIVE 07/17/2017 1259   LEUKOCYTESUR NEGATIVE 07/17/2017 1259   Sepsis Labs: @LABRCNTIP (procalcitonin:4,lacticidven:4)  ) Recent Results (from the past 240 hour(s))  Resp Panel by RT-PCR (Flu A&B, Covid) Nasopharyngeal Swab     Status: None   Collection Time: 01/07/21  5:50 PM   Specimen: Nasopharyngeal Swab; Nasopharyngeal(NP) swabs in vial transport medium  Result Value Ref Range Status   SARS Coronavirus 2 by RT PCR NEGATIVE NEGATIVE Final    Comment: (NOTE) SARS-CoV-2 target  nucleic acids are NOT DETECTED.  The SARS-CoV-2 RNA is generally detectable in upper respiratory specimens during the acute phase of infection. The lowest concentration of SARS-CoV-2 viral copies this assay can detect is 138 copies/mL. A negative result does not preclude SARS-Cov-2 infection and should not be used as the sole basis for treatment or other patient management decisions. A negative result may occur with  improper specimen collection/handling, submission of specimen other than nasopharyngeal swab, presence of viral mutation(s) within the areas targeted by this assay, and inadequate number of viral copies(<138 copies/mL). A negative result must be combined with clinical observations, patient history, and epidemiological information. The expected result is Negative.  Fact Sheet for Patients:  BloggerCourse.com  Fact Sheet for Healthcare Providers:  SeriousBroker.it  This test is no t yet approved or cleared by the [place] FDA and  has been authorized for detection and/or  diagnosis of SARS-CoV-2 by FDA under an Emergency Use Authorization (EUA). This EUA will remain  in effect (meaning this test can be used) for the duration of the COVID-19 declaration under Section 564(b)(1) of the Act, 21 U.S.C.section 360bbb-3(b)(1), unless the authorization is terminated  or revoked sooner.       Influenza A by PCR NEGATIVE NEGATIVE Final   Influenza B by PCR NEGATIVE NEGATIVE Final    Comment: (NOTE) The Xpert Xpress SARS-CoV-2/FLU/RSV plus assay is intended as an aid in the diagnosis of influenza from Nasopharyngeal swab specimens and should not be used as a sole basis for treatment. Nasal washings and aspirates are unacceptable for Xpert Xpress SARS-CoV-2/FLU/RSV testing.  Fact Sheet for Patients: BloggerCourse.com  Fact Sheet for Healthcare Providers: SeriousBroker.it  This test is not yet approved or cleared by the [place] FDA and has been authorized for detection and/or diagnosis of SARS-CoV-2 by FDA under an Emergency Use Authorization (EUA). This EUA will remain in effect (meaning this test can be used) for the duration of the COVID-19 declaration under Section 564(b)(1) of the Act, 21 U.S.C. section 360bbb-3(b)(1), unless the authorization is terminated or revoked.  Performed at [facility], [address], [place], [place] [address]   Blood culture (routine x 2)     Status: None (Preliminary result)   Collection Time: 01/07/21  6:30 PM   Specimen: Left Antecubital; Blood  Result Value Ref Range Status   Specimen Description   Final    LEFT ANTECUBITAL Performed at [facility], [address] [place], [place] [address]    Special Requests   Final    BOTTLES DRAWN AEROBIC AND ANAEROBIC Blood Culture adequate volume Performed at [facility], [address] [place], [place] [address]    Culture   Final    NO GROWTH 2  DAYS Performed at [facility], [address], [place], [place] [address]    Report Status PENDING  Incomplete  Blood culture (routine x 2)     Status: None (Preliminary result)   Collection Time: 01/07/21  6:34 PM   Specimen: Right Antecubital; Blood  Result Value Ref Range Status   Specimen Description   Final    RIGHT ANTECUBITAL Performed at [facility], [address] [place], [place] [address]    Special Requests   Final    BOTTLES DRAWN AEROBIC AND ANAEROBIC Blood Culture adequate volume Performed at [facility], [address] [place], [place] [address]    Culture   Final    NO GROWTH 2  DAYS Performed at [facility], [address] [place], [place] [address]    Report Status PENDING  Incomplete  Aerobic/Anaerobic Culture w Gram Stain (surgical/deep wound)     Status: None (Preliminary result)   Collection Time: 01/08/21  1:27 PM   Specimen: PATH Soft tissue  Result Value Ref Range Status   Specimen Description   Final    TISSUE RIGHT FOOT Performed at [facility], [address] [place], [place] [address]    Special Requests   Final    NONE Performed at [facility], [address], [place], [place] [address]    Gram Stain   Final    FEW SQUAMOUS EPITHELIAL CELLS PRESENT FEW WBC PRESENT, PREDOMINANTLY MONONUCLEAR MODERATE GRAM POSITIVE COCCI FEW GRAM NEGATIVE RODS RARE GRAM POSITIVE RODS    Culture   Final    MODERATE STREPTOCOCCUS AGALACTIAE TESTING AGAINST S. AGALACTIAE NOT ROUTINELY PERFORMED DUE TO PREDICTABILITY OF AMP/PEN/VAN SUSCEPTIBILITY. Performed at [facility], [address] [place], [place] [address]    Report Status PENDING  Incomplete         Radiology Studies: No results found.      Scheduled Meds:  docusate sodium  100 mg Oral BID   heparin  5,000 Units Subcutaneous Q8H   insulin aspart  0-15 Units Subcutaneous TID WC   insulin aspart   0-5 Units Subcutaneous QHS   insulin glargine-yfgn  5 Units Subcutaneous QHS   Continuous Infusions:  sodium chloride 100 mL/hr at 01/10/21 0309   ceFEPime (MAXIPIME) IV 2 g (01/10/21 0515)   metronidazole 500 mg (01/10/21 0914)   vancomycin 1,250 mg (01/09/21 2055)     LOS: 3 days    Time spent: 25 min    Silvano Bilis, MD [facility]   If 7PM-7AM, please contact night-coverage [email or web address] Password [facility] 01/10/2021, 9:27 AM

## 2021-01-11 ENCOUNTER — Encounter (HOSPITAL_COMMUNITY): Payer: Self-pay | Admitting: Podiatry

## 2021-01-11 ENCOUNTER — Encounter (HOSPITAL_COMMUNITY): Payer: Self-pay | Admitting: Pharmacist

## 2021-01-11 LAB — CBC
HCT: 29.1 % — ABNORMAL LOW (ref 39.0–52.0)
Hemoglobin: 9.5 g/dL — ABNORMAL LOW (ref 13.0–17.0)
MCH: 26.2 pg (ref 26.0–34.0)
MCHC: 32.6 g/dL (ref 30.0–36.0)
MCV: 80.2 fL (ref 80.0–100.0)
Platelets: 295 10*3/uL (ref 150–400)
RBC: 3.63 MIL/uL — ABNORMAL LOW (ref 4.22–5.81)
RDW: 13.8 % (ref 11.5–15.5)
WBC: 11.8 10*3/uL — ABNORMAL HIGH (ref 4.0–10.5)
nRBC: 0 % (ref 0.0–0.2)

## 2021-01-11 LAB — BASIC METABOLIC PANEL
Anion gap: 8 (ref 5–15)
BUN: 18 mg/dL (ref 6–20)
CO2: 20 mmol/L — ABNORMAL LOW (ref 22–32)
Calcium: 8 mg/dL — ABNORMAL LOW (ref 8.9–10.3)
Chloride: 105 mmol/L (ref 98–111)
Creatinine, Ser: 0.91 mg/dL (ref 0.61–1.24)
GFR, Estimated: >60 mL/min (ref >60)
Glucose, Bld: 156 mg/dL — ABNORMAL HIGH (ref 70–99)
Potassium: 4.1 mmol/L (ref 3.5–5.1)
Sodium: 133 mmol/L — ABNORMAL LOW (ref 135–145)

## 2021-01-11 LAB — GLUCOSE, CAPILLARY
Glucose-Capillary: 155 mg/dL — ABNORMAL HIGH (ref 70–99)
Glucose-Capillary: 147 mg/dL — ABNORMAL HIGH (ref 70–99)
Glucose-Capillary: 151 mg/dL — ABNORMAL HIGH (ref 70–99)

## 2021-01-11 MED ORDER — SULFAMETHOXAZOLE-TRIMETHOPRIM 800-160 MG PO TABS: 2.0000 | ORAL_TABLET | Freq: Two times a day (BID) | ORAL | 0 refills | Status: DC

## 2021-01-11 MED ORDER — SULFAMETHOXAZOLE-TRIMETHOPRIM 800-160 MG PO TABS
2.0000 | ORAL_TABLET | Freq: Two times a day (BID) | ORAL | Status: DC
Start: 2021-01-11 — End: 2021-01-11
  Administered 2021-01-11: 2 via ORAL
  Filled 2021-01-11: qty 2

## 2021-01-11 NOTE — Discharge Summary (Signed)
Richard Pittman GBE:[id number] DOB: [birth year] DOA: 01/07/2021  PCP: System, Provider Not In  Admit date: 01/07/2021 Discharge date: 01/11/2021  Time spent: 45 minutes  Recommendations for Outpatient Follow-up:  Podiatry f/u 11/29 @ 4:30 PM     Discharge Diagnoses:  Principal Problem:   Foot infection Active Problems:   Uncontrolled type 2 diabetes mellitus with hyperglycemia (HCC)   AKI (acute kidney injury) (HCC)   Metabolic acidosis   Hyponatremia   Microcytic anemia   Obesity (BMI 30-39.9)   Discharge Condition: stable  Diet recommendation: carb modified  Filed Weights   01/10/21 0500 01/10/21 1322 01/11/21 0500  Weight: 103.1 kg 103.1 kg 105.5 kg    History of present illness:  Richard Pittman is a 50 y.o. male with medical history significant for but not limited to diabetes mellitus type 2, obesity as well as other comorbidities who presented to the emergency room with a chief complaint of right foot pain and drainage.  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  His foot started swelling and became infected so he went to see his PCP who put him on Keflex.  Since then he has an increased level of drainage and he so he went to urgent care yesterday and was changed to doxycycline.  He started spiking fevers and chills yesterday but denied chest pain or shortness breath.  He is diabetic.  Because of his fever he came to the ED with significant right lower extremity swelling and erythema.  He states that he does have some pain but denies numbness, tingling, weakness.  No abdominal pain, nausea, vomiting.  [facility] was asked admit this patient for right diabetic foot infection in the setting of nail puncture and an MRI was requested as well as podiatry evaluation.  Hospital Course:  # Foot infection # Sepsis Stepped on nail 2 weeks ago. Failed outpatient keflex>doxy. Here  sig swelling, now starting to drain, mri shows abscess. No retained foreign body on imaging. Received tetanus vaccine as outpt. Sepsis by leukocytosis, hr. Clinically stable, sepsis physiology resolved . Podiatry procedure 11/23 I & D, debridement. 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 Wound culture growing strep and staph. Podiatry thinks they achieved source control. PT evaluated, no hh needs.  - podiatry f/u 11/29 - daily dressing changes - bactrim ds bid for 7 days, f/u culture results - return precautions reviewed   # T2DM A1c 8.1.  - advised close pcp f/u, better control henceforth  # AK Cr 1.85, normalized w/ fluids    Procedures: See above   Consultations: podiatry  Discharge Exam: Vitals:   01/11/21 0646 01/11/21 1037  BP: (!) 170/86 (!) 157/78  Pulse: 87 88  Resp: 20 15  Temp: 98.6 F (37 C) 99.7 F (37.6 C)  SpO2: 99% 98%    General exam: Appears calm and comfortable  Respiratory system: Clear to auscultation. Respiratory effort normal. Cardiovascular system: S1 & S2 heard, RRR. No JVD, murmurs, rubs, gallops or clicks. No pedal edema. Gastrointestinal system: Abdomen is nondistended, soft and nontender. No organomegaly or masses felt. Normal bowel sounds heard. Central nervous system: Alert and oriented. No focal neurological deficits. Extremities: sig swelling of right foot, erythema distally, it is wrapped, toes are purple and swollen, warm Skin: no rashes Psychiatry: Judgement and insight appear normal. Mood & affect appropriate.   Discharge Instructions   Discharge Instructions  Diet - low sodium heart healthy   Complete by: As directed    Discharge wound care:   Complete by: As directed    Change daily   Increase activity slowly   Complete by: As directed       Allergies as of 01/11/2021   No Known Allergies      Medication List     STOP taking these medications    doxycycline 100 MG capsule Commonly known  as: VIBRAMYCIN   traMADol 50 MG tablet Commonly known as: ULTRAM       TAKE these medications    acetaminophen 500 MG tablet Commonly known as: TYLENOL Take 1,000 mg by mouth every 6 (six) hours as needed for mild pain.   glipiZIDE 10 MG tablet Commonly known as: GLUCOTROL Take 20 mg by mouth 2 (two) times daily before a meal.   metFORMIN 1000 MG (MOD) 24 hr tablet Commonly known as: GLUMETZA Take 1,000 mg by mouth 2 (two) times daily with a meal.   naproxen 500 MG tablet Commonly known as: NAPROSYN Take 1 tablet (500 mg total) by mouth 2 (two) times daily with a meal.   sulfamethoxazole-trimethoprim 800-160 MG tablet Commonly known as: BACTRIM DS Take 2 tablets by mouth every 12 (twelve) hours for 13 doses.               Discharge Care Instructions  (From admission, onward)           Start     Ordered   01/11/21 0000  Discharge wound care:       Comments: Change daily   01/11/21 1506           No Known Allergies  Follow-up Information     Park Liter, DPM Follow up.   Specialty: Podiatry Why: 4:30 PM on 01/14/2021 Contact information: [address] [place] [address] [phone]                  The results of significant diagnostics from this hospitalization (including imaging, microbiology, ancillary and laboratory) are listed below for reference.    Significant Diagnostic Studies: MR FOOT RIGHT WO CONTRAST  Result Date: 01/07/2021 CLINICAL DATA:  Right foot pain and swelling.  Stepped on a nail. EXAM: MRI OF THE RIGHT FOREFOOT WITHOUT CONTRAST TECHNIQUE: Multiplanar, multisequence MR imaging of the right forefoot was performed. No intravenous contrast was administered. COMPARISON:  Right foot x-rays from same day. FINDINGS: Bones/Joint/Cartilage No marrow signal abnormality. No fracture or dislocation. Joint spaces are preserved. No joint effusion. Ligaments Collateral ligaments are intact.  Lisfranc ligament is intact.  Muscles and Tendons Flexor and extensor tendons are intact. No tenosynovitis. Increased T2 signal within the intrinsic muscles of the forefoot, nonspecific, but likely related to diabetic muscle changes. Soft tissue Severe diffuse soft tissue swelling of the right foot. Puncture wound noted at the plantar base of the second proximal phalanx with the regular 2.2 x 4.6 x 2.9 cm fluid collection surrounding the second through fourth proximal phalanges (series 6, image 9; series 8, image 13). Smaller small 8 mm fluid collection at the lateral plantar base of the fifth metatarsal head, likely an adventitial bursa. No soft tissue mass. IMPRESSION: 1. Puncture wound at the plantar base of the second proximal phalanx with 2.2 x 4.6 x 2.9 cm abscess surrounding the second through fourth proximal phalanges. No osteomyelitis. 2. Severe right foot cellulitis. Electronically Signed   By: Obie Dredge M.D.   On: 01/07/2021 22:19  DG Foot 2 Views Right  Result Date: 01/10/2021 CLINICAL DATA:  Status post right fourth digit amputation. EXAM: RIGHT FOOT - 2 VIEW COMPARISON:  None. FINDINGS: Status post amputation of the fourth phalanges. There is no acute fracture or dislocation. The soft tissue swelling of the foot. Dressing noted over the forefoot. IMPRESSION: Status post amputation of the fourth phalanges. Electronically Signed   By: Elgie Collard M.D.   On: 01/10/2021 19:24   DG Foot Complete Right  Result Date: 01/07/2021 CLINICAL DATA:  Wound infection EXAM: RIGHT FOOT COMPLETE - 3+ VIEW COMPARISON:  01/06/2021 FINDINGS: No fracture or malalignment. No radiopaque foreign body. Increased plantar soft tissue swelling without emphysema. No periostitis or osseous destructive change. There are vascular calcifications. IMPRESSION: Increasing plantar soft tissue swelling. No acute osseous abnormality. Electronically Signed   By: Jasmine Pang M.D.   On: 01/07/2021 16:56   DG Foot Complete Right  Result Date:  01/06/2021 CLINICAL DATA:  Right foot pain.  History of puncture wound EXAM: RIGHT FOOT COMPLETE - 3+ VIEW COMPARISON:  None. FINDINGS: There is no evidence of fracture or dislocation. Joint spaces are relatively preserved. No erosion or periosteal elevation is evident. Soft tissue swelling the plantar aspect of the foot in the region of the distal forefoot. No soft tissue gas or radiopaque foreign body evident. Vascular calcifications are present. IMPRESSION: 1. Soft tissue swelling of the plantar aspect of the foot. 2. No acute osseous abnormality.  No radiopaque foreign body. Electronically Signed   By: Duanne Guess D.O.   On: 01/06/2021 12:25    Microbiology: Recent Results (from the past 240 hour(s))  Resp Panel by RT-PCR (Flu A&B, Covid) Nasopharyngeal Swab     Status: None   Collection Time: 01/07/21  5:50 PM   Specimen: Nasopharyngeal Swab; Nasopharyngeal(NP) swabs in vial transport medium  Result Value Ref Range Status   SARS Coronavirus 2 by RT PCR NEGATIVE NEGATIVE Final    Comment: (NOTE) SARS-CoV-2 target nucleic acids are NOT DETECTED.  The SARS-CoV-2 RNA is generally detectable in upper respiratory specimens during the acute phase of infection. The lowest concentration of SARS-CoV-2 viral copies this assay can detect is 138 copies/mL. A negative result does not preclude SARS-Cov-2 infection and should not be used as the sole basis for treatment or other patient management decisions. A negative result may occur with  improper specimen collection/handling, submission of specimen other than nasopharyngeal swab, presence of viral mutation(s) within the areas targeted by this assay, and inadequate number of viral copies(<138 copies/mL). A negative result must be combined with clinical observations, patient history, and epidemiological information. The expected result is Negative.  Fact Sheet for Patients:  BloggerCourse.com  Fact Sheet for Healthcare  Providers:  SeriousBroker.it  This test is no t yet approved or cleared by the [place] FDA and  has been authorized for detection and/or diagnosis of SARS-CoV-2 by FDA under an Emergency Use Authorization (EUA). This EUA will remain  in effect (meaning this test can be used) for the duration of the COVID-19 declaration under Section 564(b)(1) of the Act, 21 U.S.C.section 360bbb-3(b)(1), unless the authorization is terminated  or revoked sooner.       Influenza A by PCR NEGATIVE NEGATIVE Final   Influenza B by PCR NEGATIVE NEGATIVE Final    Comment: (NOTE) The Xpert Xpress SARS-CoV-2/FLU/RSV plus assay is intended as an aid in the diagnosis of influenza from Nasopharyngeal swab specimens and should not be used as a sole basis for treatment. Nasal  washings and aspirates are unacceptable for Xpert Xpress SARS-CoV-2/FLU/RSV testing.  Fact Sheet for Patients: BloggerCourse.com  Fact Sheet for Healthcare Providers: SeriousBroker.it  This test is not yet approved or cleared by the [place] FDA and has been authorized for detection and/or diagnosis of SARS-CoV-2 by FDA under an Emergency Use Authorization (EUA). This EUA will remain in effect (meaning this test can be used) for the duration of the COVID-19 declaration under Section 564(b)(1) of the Act, 21 U.S.C. section 360bbb-3(b)(1), unless the authorization is terminated or revoked.  Performed at [facility], [address] [place], [place] [address]   Blood culture (routine x 2)     Status: None (Preliminary result)   Collection Time: 01/07/21  6:30 PM   Specimen: Left Antecubital; Blood  Result Value Ref Range Status   Specimen Description   Final    LEFT ANTECUBITAL Performed at [facility], [address], [place], [place] [address]    Special Requests   Final    BOTTLES DRAWN AEROBIC AND  ANAEROBIC Blood Culture adequate volume Performed at [facility], [address] [place], [place] [address]    Culture   Final    NO GROWTH 4 DAYS Performed at [facility], [address], [place], [place] [address]    Report Status PENDING  Incomplete  Blood culture (routine x 2)     Status: None (Preliminary result)   Collection Time: 01/07/21  6:34 PM   Specimen: Right Antecubital; Blood  Result Value Ref Range Status   Specimen Description   Final    RIGHT ANTECUBITAL Performed at [facility], [address] [place], [place] [address]    Special Requests   Final    BOTTLES DRAWN AEROBIC AND ANAEROBIC Blood Culture adequate volume Performed at [facility], [address], [place], [place] [address]    Culture   Final    NO GROWTH 4 DAYS Performed at [facility], [address], [place], [place] [address]    Report Status PENDING  Incomplete  Aerobic/Anaerobic Culture w Gram Stain (surgical/deep wound)     Status: None (Preliminary result)   Collection Time: 01/08/21  1:27 PM   Specimen: PATH Soft tissue  Result Value Ref Range Status   Specimen Description   Final    TISSUE RIGHT FOOT Performed at [facility], [address], [place], [place] [address]    Special Requests   Final    NONE Performed at [facility], [address] [place], [place] [address]    Gram Stain   Final    FEW SQUAMOUS EPITHELIAL CELLS PRESENT FEW WBC PRESENT, PREDOMINANTLY MONONUCLEAR MODERATE GRAM POSITIVE COCCI FEW GRAM NEGATIVE RODS RARE GRAM POSITIVE RODS    Culture   Final    MODERATE STREPTOCOCCUS AGALACTIAE TESTING AGAINST S. AGALACTIAE NOT ROUTINELY PERFORMED DUE TO PREDICTABILITY OF AMP/PEN/VAN SUSCEPTIBILITY. FEW STAPHYLOCOCCUS AUREUS MODERATE STREPTOCOCCUS ANGINOSIS SUSCEPTIBILITIES TO FOLLOW HOLDING FOR POSSIBLE ANAEROBE Performed at [facility], [address],  [place], [place] [address]    Report Status PENDING  Incomplete     Labs: Basic Metabolic Panel: Recent Labs  Lab 01/06/21 1215 01/07/21 1700 01/08/21 0453 01/10/21 0444 01/11/21 0529  NA 131* 131* 133* 131* 133*  K 5.1 4.8 4.6 4.6 4.1  CL 99 101 105 106 105  CO2 23 20* 20* 18* 20*  GLUCOSE 402* 343* 223* 211* 156*  BUN 19 33* 29* 21* 18  CREATININE 1.49* 1.85* 1.27* 0.96 0.91  CALCIUM 8.5* 7.8* 7.9* 7.9* 8.0*   Liver Function Tests: Recent Labs  Lab 01/06/21 1215 01/07/21 1700 01/08/21 0453  AST 15 12* 15  ALT 17 15 16   ALKPHOS 91 82 82  BILITOT 0.6 0.5 0.6  PROT 6.6 6.6 6.2*  ALBUMIN 2.9* 3.0* 2.6*   No results for input(s): LIPASE, AMYLASE in the last 168 hours. No results for input(s): AMMONIA in the last 168 hours. CBC: Recent Labs  Lab 01/06/21 1215 01/07/21 1700 01/08/21 0453 01/10/21 0444 01/11/21 0529  WBC 14.1* 14.7* 14.0* 14.2* 11.8*  NEUTROABS 12.1* 12.6*  --   --   --   HGB 11.6* 10.6* 10.0* 9.5* 9.5*  HCT 35.8* 31.9* 30.4* 29.2* 29.1*  MCV 79.0* 78.0* 78.6* 80.0 80.2  PLT 257 256 235 282 295   Cardiac Enzymes: No results for input(s): CKTOTAL, CKMB, CKMBINDEX, TROPONINI in the last 168 hours. BNP: BNP (last 3 results) No results for input(s): BNP in the last 8760 hours.  ProBNP (last 3 results) No results for input(s): PROBNP in the last 8760 hours.  CBG: Recent Labs  Lab 01/10/21 1528 01/10/21 1629 01/10/21 2146 01/11/21 0758 01/11/21 1143  GLUCAP 140* 138* 145* 155* 147*       Signed:  01/13/21 MD.  [facility] 01/11/2021, 3:14 PM

## 2021-01-11 NOTE — Progress Notes (Signed)
  Subjective:  Patient ID: Richard Pittman, male    DOB: [birth year],  MRN: [id number]  Patient seen bedside. Denies complaints. Pain controlled.  Interpreter used for assistance in translation. Objective:   Vitals:   01/11/21 0646 01/11/21 1037  BP: (!) 170/86 (!) 157/78  Pulse: 87 88  Resp: 20 15  Temp: 98.6 F (37 C) 99.7 F (37.6 C)  SpO2: 99% 98%   General AA&O x3. Normal mood and affect.  Vascular Dorsalis pedis and posterior tibial pulses 2/4 bilat. Brisk capillary refill to all digits. Pedal hair present.  Neurologic Epicritic sensation grossly intact. Sensation intact all digits right foot.  Dermatologic Dressing c/d/I. Slight dusky area distal 3rd toe.  Orthopedic: +Motor to foot    Assessment & Plan:  Patient was evaluated and treated and all questions answered.  Puncture wound right foot with abscess -Doing well post-op -Dressing left intact. Orders to change today for prior to d/c. -WBL RLE, if unable to do so will have to do Heel WB. Worked with PT today appreciate their reccs. -Ok for d/c at this time. Recc 1 week PO abx. Cultures not yet finalized. Recc coverage for MRSA. -Patient to f/u Tuesday at 430 in [place] office.  Park Liter, DPM  Accessible via secure chat for questions or concerns.

## 2021-01-11 NOTE — Plan of Care (Signed)
Pt for discharge this evening home with family.  AVS printed and reviewed with patient, son, and spouse.  RN demonstrated dressing change and wound care. Reviewed process with patient and spouse at bedside with aid of interrupter. Educated on frequency and abnormalities to report to doctors.  Walker and CAM boot provided to patient for home use. RN reiterated PT recommendations and precautions.  All questions addressed and answered. Interrupter used throughout discharge process. All three verbalized understanding. Encouraged to call 9-1-1 in case of emergency or report back to the ER.  IV and telemetry removed.

## 2021-01-11 NOTE — Evaluation (Signed)
Physical Therapy Evaluation Patient Details Name: Richard Pittman MRN: [id number] DOB: Jun 11, 1970 Today's Date: 01/11/2021  History of Present Illness  50 yo male admit with infection in R 4th toe, s/p R toe amputation on 01/10/2021. Pt with PMH : DM  Clinical Impression  Pt tolerated session well. Educated and performed on all mobility , ambulation with RW and up and down step with RW to maintain NWB on RLE. Educated with safety with RW and exercises he can perform with his LEs and UE s to maintain strength. Pt very appreciative and ready from a mobility standpoint. No further PT needs at this time.        Recommendations for follow up therapy are one component of a multi-disciplinary discharge planning process, led by the attending physician.  Recommendations may be updated based on patient status, additional functional criteria and insurance authorization.  Follow Up Recommendations No PT follow up    Assistance Recommended at Discharge Intermittent Supervision/Assistance  Functional Status Assessment Patient has had a recent decline in their functional status and demonstrates the ability to make significant improvements in function in a reasonable and predictable amount of time.  Equipment Recommendations  None recommended by PT (pt has RW)    Recommendations for Other Services       Precautions / Restrictions Precautions Required Braces or Orthoses: Other Brace Other Brace: CAM boot at all times per podiatrist Restrictions Weight Bearing Restrictions: Yes RLE Weight Bearing: Non weight bearing Other Position/Activity Restrictions: NWB RLE with CAM boot, can heel weight bear if necessary and difficulty with NWB      Mobility  Bed Mobility Overal bed mobility: Independent                  Transfers Overall transfer level: Needs assistance Equipment used: Rolling walker (2 wheels) Transfers: Sit to/from Stand Sit to Stand: Min guard           General  transfer comment: min guard due to first time up and making sure he had his balance and was feeling okay. Little unsteady until he could figure out how to balance with RW and the correct location to be and utilize it    Ambulation/Gait Ambulation/Gait assistance: Min guard Gait Distance (Feet): 50 Feet Assistive device: Rolling walker (2 wheels)         General Gait Details: hopping on LLE for NWB on RLE. Tolerated well with RW. Fatigued at this distance  Stairs Stairs: Yes Stairs assistance: Min guard Stair Management: No rails Number of Stairs: 1 General stair comments: backward using RW to hop up and forward for coming down  Wheelchair Mobility    Modified Rankin (Stroke Patients Only)       Balance Overall balance assessment: Needs assistance Sitting-balance support: No upper extremity supported;Feet supported Sitting balance-Leahy Scale: Normal     Standing balance support: Bilateral upper extremity supported;During functional activity Standing balance-Leahy Scale: Fair                               Pertinent Vitals/Pain Pain Assessment: No/denies pain    Home Living Family/patient expects to be discharged to:: Private residence Living Arrangements: Spouse/significant other;Children Available Help at Discharge: Family Type of Home: House Home Access: Stairs to enter Entrance Stairs-Rails: None Entrance Stairs-Number of Steps: 1   Home Layout: Multi-level;Able to live on main level with bedroom/bathroom Home Equipment: None      Prior Function Prior Level of  Function : Independent/Modified Independent             Mobility Comments: works as Corporate investment banker. had nail go through his boot 2 weeks ago and has had fevers and infection in his R foot ever since.       Hand Dominance        Extremity/Trunk Assessment        Lower Extremity Assessment Lower Extremity Assessment: Overall WFL for tasks assessed (R ankle no assess due  p/o dressig and CAM boot)       Communication   Communication: No difficulties;Interpreter utilized  Cognition Arousal/Alertness: Awake/alert Behavior During Therapy: WFL for tasks assessed/performed Overall Cognitive Status: Within Functional Limits for tasks assessed                                          General Comments      Exercises Other Exercises Other Exercises: educated with ability to perform SLR and knee extensions, and UE exercises to maintain strength   Assessment/Plan    PT Assessment Patient does not need any further PT services  PT Problem List         PT Treatment Interventions      PT Goals (Current goals can be found in the Care Plan section)  Acute Rehab PT Goals Patient Stated Goal: I want to be able to get better and get back to work PT Goal Formulation: All assessment and education complete, DC therapy    Frequency     Barriers to discharge        Co-evaluation               AM-PAC PT "6 Clicks" Mobility  Outcome Measure Help needed turning from your back to your side while in a flat bed without using bedrails?: A Little Help needed moving from lying on your back to sitting on the side of a flat bed without using bedrails?: A Little Help needed moving to and from a bed to a chair (including a wheelchair)?: A Little Help needed standing up from a chair using your arms (e.g., wheelchair or bedside chair)?: A Little Help needed to walk in hospital room?: A Little Help needed climbing 3-5 steps with a railing? : A Little 6 Click Score: 18    End of Session Equipment Utilized During Treatment: Gait belt Activity Tolerance: Patient tolerated treatment well Patient left: in chair;with family/visitor present;with call bell/phone within reach Nurse Communication: Mobility status PT Visit Diagnosis: Other abnormalities of gait and mobility (R26.89)    Time: 1345-1425 PT Time Calculation (min) (ACUTE ONLY): 40  min   Charges:   PT Evaluation $PT Eval Low Complexity: 1 Low PT Treatments $Gait Training: 8-22 mins $Therapeutic Activity: 8-22 mins        Richard Pittman, PT, MPT Acute Rehabilitation Services Office: [phone] Pager: [phone] 01/11/2021   Richard Pittman 01/11/2021, 4:59 PM

## 2021-01-11 NOTE — Progress Notes (Signed)
The patient is injury-free, afebrile, alert, and oriented X 3. Pt was febrile during this shift with max temp of 102 F, NP was notified. The rest of VS were within the baseline during this shift.Pt denies chest pain, SOB, nausea, vomiting, dizziness, signs or symptoms of bleeding or acute changes during this shift. We will continue to monitor and work toward achieving the care plan goals

## 2021-01-11 NOTE — Plan of Care (Signed)
Pt aox4, pleasant and cooperative spanish speaking man. Spouse at bedside. iPad interpreter at bedside and available.  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  Post [employer] in [place], original surgical dressing in place.  Pain management and IV antibiotics per orders.  NWB, PT consulted.   Problem: Coping: Goal: Level of anxiety will decrease Outcome: Progressing   Problem: Pain Managment: Goal: General experience of comfort will improve Outcome: Progressing   Problem: Safety: Goal: Ability to remain free from injury will improve Outcome: Progressing   Problem: Clinical Measurements: Goal: Ability to maintain clinical measurements within normal limits will improve Outcome: Progressing

## 2021-01-11 NOTE — Plan of Care (Signed)

## 2021-01-12 LAB — AEROBIC/ANAEROBIC CULTURE W GRAM STAIN (SURGICAL/DEEP WOUND)

## 2021-01-12 LAB — CULTURE, BLOOD (ROUTINE X 2)
Culture: NO GROWTH
Culture: NO GROWTH
Special Requests: ADEQUATE
Special Requests: ADEQUATE

## 2021-01-13 ENCOUNTER — Telehealth: Payer: Self-pay | Admitting: Podiatry

## 2021-01-13 NOTE — Anesthesia Postprocedure Evaluation (Signed)
Anesthesia Post Note  Patient: Richard Pittman  Procedure(s) Performed: INCISION AND DRAINAGE (Right: Foot)     Patient location during evaluation: PACU Anesthesia Type: MAC Level of consciousness: awake and alert Pain management: pain level controlled Vital Signs Assessment: post-procedure vital signs reviewed and stable Respiratory status: spontaneous breathing, nonlabored ventilation, respiratory function stable and patient connected to nasal cannula oxygen Cardiovascular status: stable and blood pressure returned to baseline Postop Assessment: no apparent nausea or vomiting Anesthetic complications: no   No notable events documented.  Last Vitals:  Vitals:   01/11/21 1037 01/11/21 1623  BP: (!) 157/78 (!) 169/83  Pulse: 88 88  Resp: 15 19  Temp: 37.6 C 37.1 C  SpO2: 98% 99%    Last Pain:  Vitals:   01/11/21 1623  TempSrc: Oral  PainSc:                  [place]et [place]

## 2021-01-13 NOTE — Telephone Encounter (Signed)
Advised we keep appt tomorrow, unfortunately nothing to do but monitor. Ivonne called patient to inform

## 2021-01-13 NOTE — Telephone Encounter (Signed)
Patients son is calling on his behalf in reference to his fathers toe. He states that it appears to be more purple in color. They are wondering should they go to the emergency room or wait for the appt on 11/29. Please advise.

## 2021-01-13 NOTE — Telephone Encounter (Signed)
Pt was instructed that per Dr. [name] to wait until tomorrow for his 1:15 pm appt

## 2021-01-14 ENCOUNTER — Ambulatory Visit (INDEPENDENT_AMBULATORY_CARE_PROVIDER_SITE_OTHER): Payer: Self-pay | Admitting: Podiatry

## 2021-01-14 ENCOUNTER — Telehealth: Payer: Self-pay | Admitting: Podiatry

## 2021-01-14 ENCOUNTER — Other Ambulatory Visit: Payer: Self-pay

## 2021-01-14 DIAGNOSIS — L089 Local infection of the skin and subcutaneous tissue, unspecified: Secondary | ICD-10-CM

## 2021-01-14 DIAGNOSIS — I96 Gangrene, not elsewhere classified: Secondary | ICD-10-CM

## 2021-01-14 DIAGNOSIS — Z9889 Other specified postprocedural states: Secondary | ICD-10-CM

## 2021-01-14 MED ORDER — SANTYL 250 UNIT/GM EX OINT: 1.0000 "application " | TOPICAL_OINTMENT | Freq: Every day | CUTANEOUS | 0 refills | Status: DC

## 2021-01-15 ENCOUNTER — Encounter (HOSPITAL_COMMUNITY): Payer: Self-pay | Admitting: Emergency Medicine

## 2021-01-15 ENCOUNTER — Inpatient Hospital Stay (HOSPITAL_COMMUNITY)
Admission: EM | Admit: 2021-01-15 | Discharge: 2021-01-18 | DRG: 300 | Disposition: A | Discharge status: Home Health | Payer: Self-pay | Source: Ambulatory Visit | Attending: Internal Medicine | Admitting: Internal Medicine

## 2021-01-15 ENCOUNTER — Ambulatory Visit (INDEPENDENT_AMBULATORY_CARE_PROVIDER_SITE_OTHER): Payer: Self-pay | Admitting: Podiatry

## 2021-01-15 ENCOUNTER — Telehealth: Payer: Self-pay | Admitting: Podiatry

## 2021-01-15 ENCOUNTER — Emergency Department (HOSPITAL_COMMUNITY): Payer: Self-pay

## 2021-01-15 DIAGNOSIS — L089 Local infection of the skin and subcutaneous tissue, unspecified: Secondary | ICD-10-CM

## 2021-01-15 DIAGNOSIS — Z20822 Contact with and (suspected) exposure to covid-19: Secondary | ICD-10-CM | POA: Diagnosis present

## 2021-01-15 DIAGNOSIS — E11621 Type 2 diabetes mellitus with foot ulcer: Secondary | ICD-10-CM

## 2021-01-15 DIAGNOSIS — Z833 Family history of diabetes mellitus: Secondary | ICD-10-CM

## 2021-01-15 DIAGNOSIS — E871 Hypo-osmolality and hyponatremia: Secondary | ICD-10-CM | POA: Diagnosis present

## 2021-01-15 DIAGNOSIS — R7989 Other specified abnormal findings of blood chemistry: Secondary | ICD-10-CM

## 2021-01-15 DIAGNOSIS — Z7984 Long term (current) use of oral hypoglycemic drugs: Secondary | ICD-10-CM

## 2021-01-15 DIAGNOSIS — S91331D Puncture wound without foreign body, right foot, subsequent encounter: Secondary | ICD-10-CM

## 2021-01-15 DIAGNOSIS — Z89421 Acquired absence of other right toe(s): Secondary | ICD-10-CM

## 2021-01-15 DIAGNOSIS — D509 Iron deficiency anemia, unspecified: Secondary | ICD-10-CM | POA: Diagnosis present

## 2021-01-15 DIAGNOSIS — E1165 Type 2 diabetes mellitus with hyperglycemia: Secondary | ICD-10-CM

## 2021-01-15 DIAGNOSIS — E11628 Type 2 diabetes mellitus with other skin complications: Secondary | ICD-10-CM

## 2021-01-15 DIAGNOSIS — B999 Unspecified infectious disease: Secondary | ICD-10-CM | POA: Diagnosis present

## 2021-01-15 DIAGNOSIS — Z79899 Other long term (current) drug therapy: Secondary | ICD-10-CM

## 2021-01-15 DIAGNOSIS — L97509 Non-pressure chronic ulcer of other part of unspecified foot with unspecified severity: Secondary | ICD-10-CM

## 2021-01-15 DIAGNOSIS — W450XXD Nail entering through skin, subsequent encounter: Secondary | ICD-10-CM

## 2021-01-15 DIAGNOSIS — E1152 Type 2 diabetes mellitus with diabetic peripheral angiopathy with gangrene: Principal | ICD-10-CM | POA: Diagnosis present

## 2021-01-15 DIAGNOSIS — L97519 Non-pressure chronic ulcer of other part of right foot with unspecified severity: Secondary | ICD-10-CM | POA: Diagnosis present

## 2021-01-15 DIAGNOSIS — E875 Hyperkalemia: Secondary | ICD-10-CM

## 2021-01-15 DIAGNOSIS — I96 Gangrene, not elsewhere classified: Secondary | ICD-10-CM

## 2021-01-15 DIAGNOSIS — N179 Acute kidney failure, unspecified: Secondary | ICD-10-CM

## 2021-01-15 LAB — CBC WITH DIFFERENTIAL/PLATELET
Abs Immature Granulocytes: 0.15 10*3/uL — ABNORMAL HIGH (ref 0.00–0.07)
Basophils Absolute: 0.1 10*3/uL (ref 0.0–0.1)
Basophils Relative: 1 %
Eosinophils Absolute: 0.5 10*3/uL (ref 0.0–0.5)
Eosinophils Relative: 5 %
HCT: 34 % — ABNORMAL LOW (ref 39.0–52.0)
Hemoglobin: 10.6 g/dL — ABNORMAL LOW (ref 13.0–17.0)
Immature Granulocytes: 1 %
Lymphocytes Relative: 14 %
Lymphs Abs: 1.5 10*3/uL (ref 0.7–4.0)
MCH: 25.5 pg — ABNORMAL LOW (ref 26.0–34.0)
MCHC: 31.2 g/dL (ref 30.0–36.0)
MCV: 81.7 fL (ref 80.0–100.0)
Monocytes Absolute: 0.6 10*3/uL (ref 0.1–1.0)
Monocytes Relative: 6 %
Neutro Abs: 8.1 10*3/uL — ABNORMAL HIGH (ref 1.7–7.7)
Neutrophils Relative %: 73 %
Platelets: 391 10*3/uL (ref 150–400)
RBC: 4.16 MIL/uL — ABNORMAL LOW (ref 4.22–5.81)
RDW: 14.1 % (ref 11.5–15.5)
WBC: 11 10*3/uL — ABNORMAL HIGH (ref 4.0–10.5)
nRBC: 0 % (ref 0.0–0.2)

## 2021-01-15 LAB — BASIC METABOLIC PANEL
Anion gap: 9 (ref 5–15)
BUN: 18 mg/dL (ref 6–20)
CO2: 20 mmol/L — ABNORMAL LOW (ref 22–32)
Calcium: 8.6 mg/dL — ABNORMAL LOW (ref 8.9–10.3)
Chloride: 106 mmol/L (ref 98–111)
Creatinine, Ser: 1.48 mg/dL — ABNORMAL HIGH (ref 0.61–1.24)
GFR, Estimated: 57 mL/min — ABNORMAL LOW (ref >60)
Glucose, Bld: 81 mg/dL (ref 70–99)
Potassium: 5.2 mmol/L — ABNORMAL HIGH (ref 3.5–5.1)
Sodium: 135 mmol/L (ref 135–145)

## 2021-01-15 LAB — RESP PANEL BY RT-PCR (FLU A&B, COVID) ARPGX2
Influenza A by PCR: NEGATIVE
Influenza B by PCR: NEGATIVE
SARS Coronavirus 2 by RT PCR: NEGATIVE

## 2021-01-15 LAB — LACTIC ACID, PLASMA: Lactic Acid, Venous: 0.9 mmol/L (ref 0.5–1.9)

## 2021-01-15 LAB — SURGICAL PATHOLOGY

## 2021-01-15 IMAGING — DX DG FOOT COMPLETE 3+V*R*
3 series · 3 of 3 positions shown · non-contrast
Comparison: [DATE]

CLINICAL DATA: Foot infection redness to the third and second toe

EXAM:
RIGHT FOOT COMPLETE - 3+ VIEW

[foot obl]
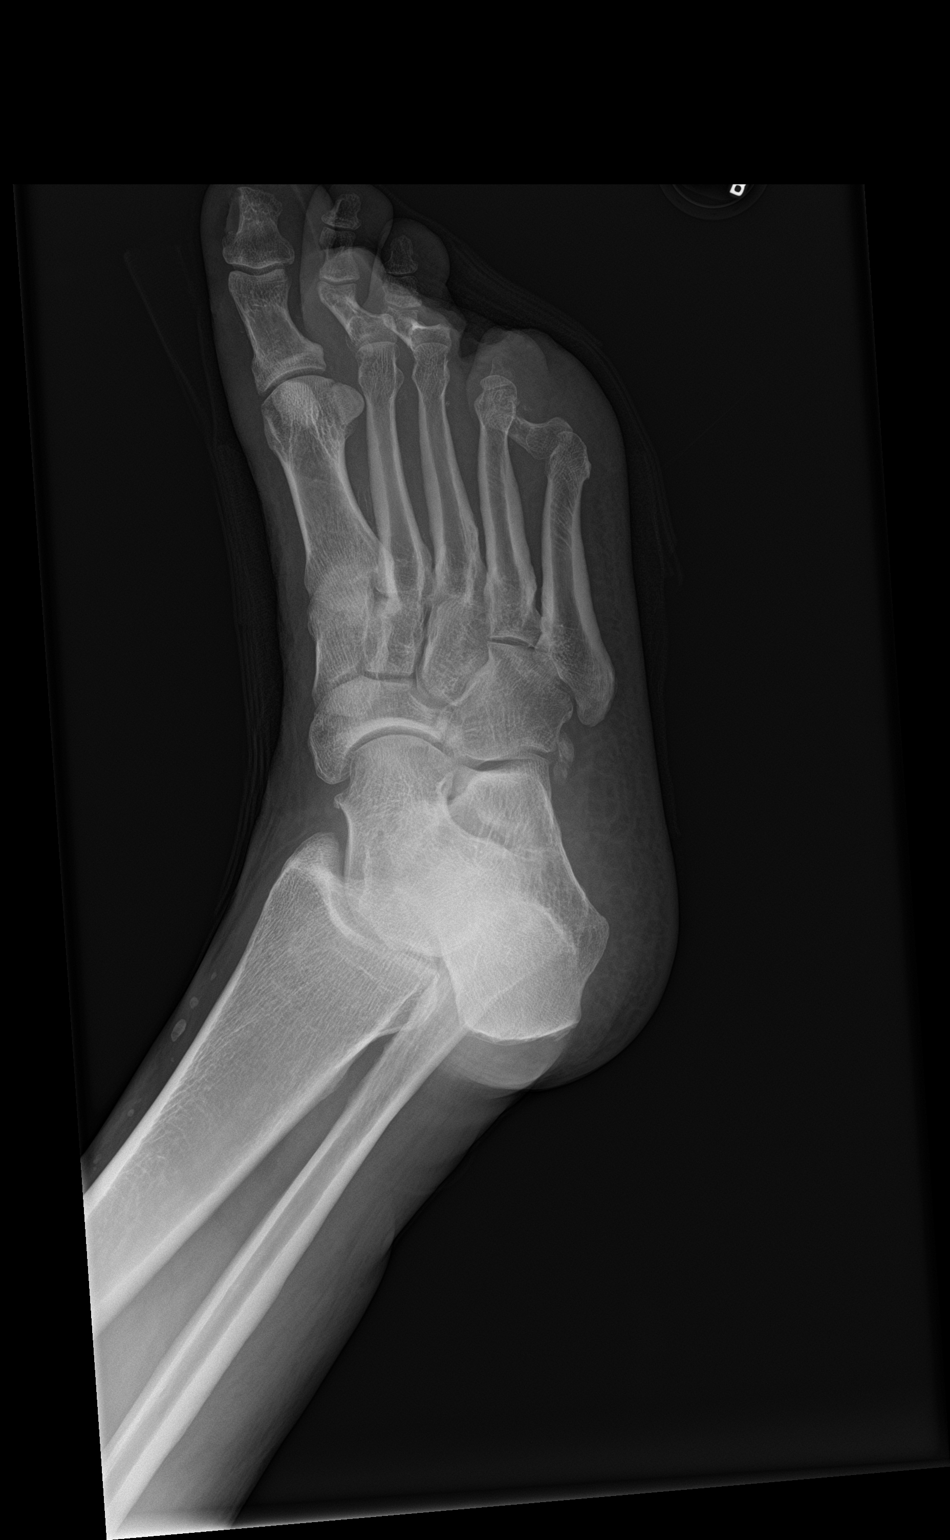

[foot lat]
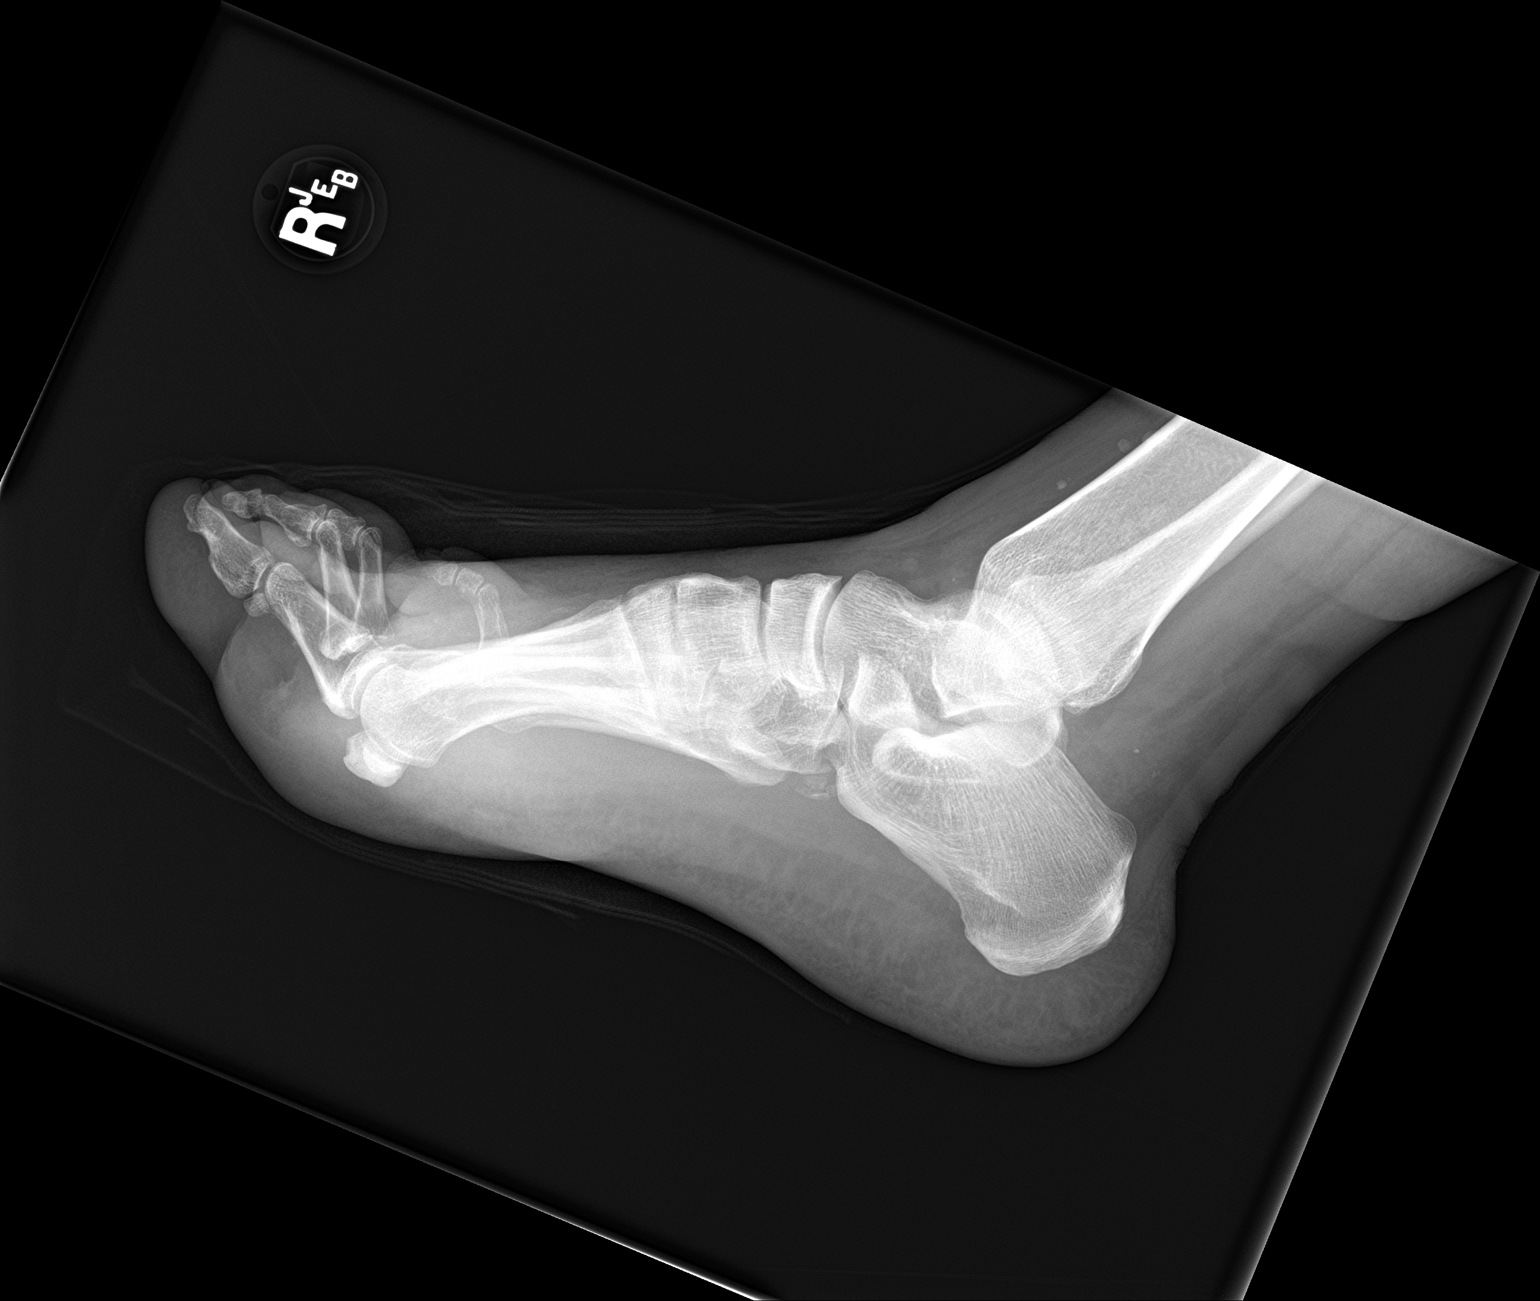

[foot ap]
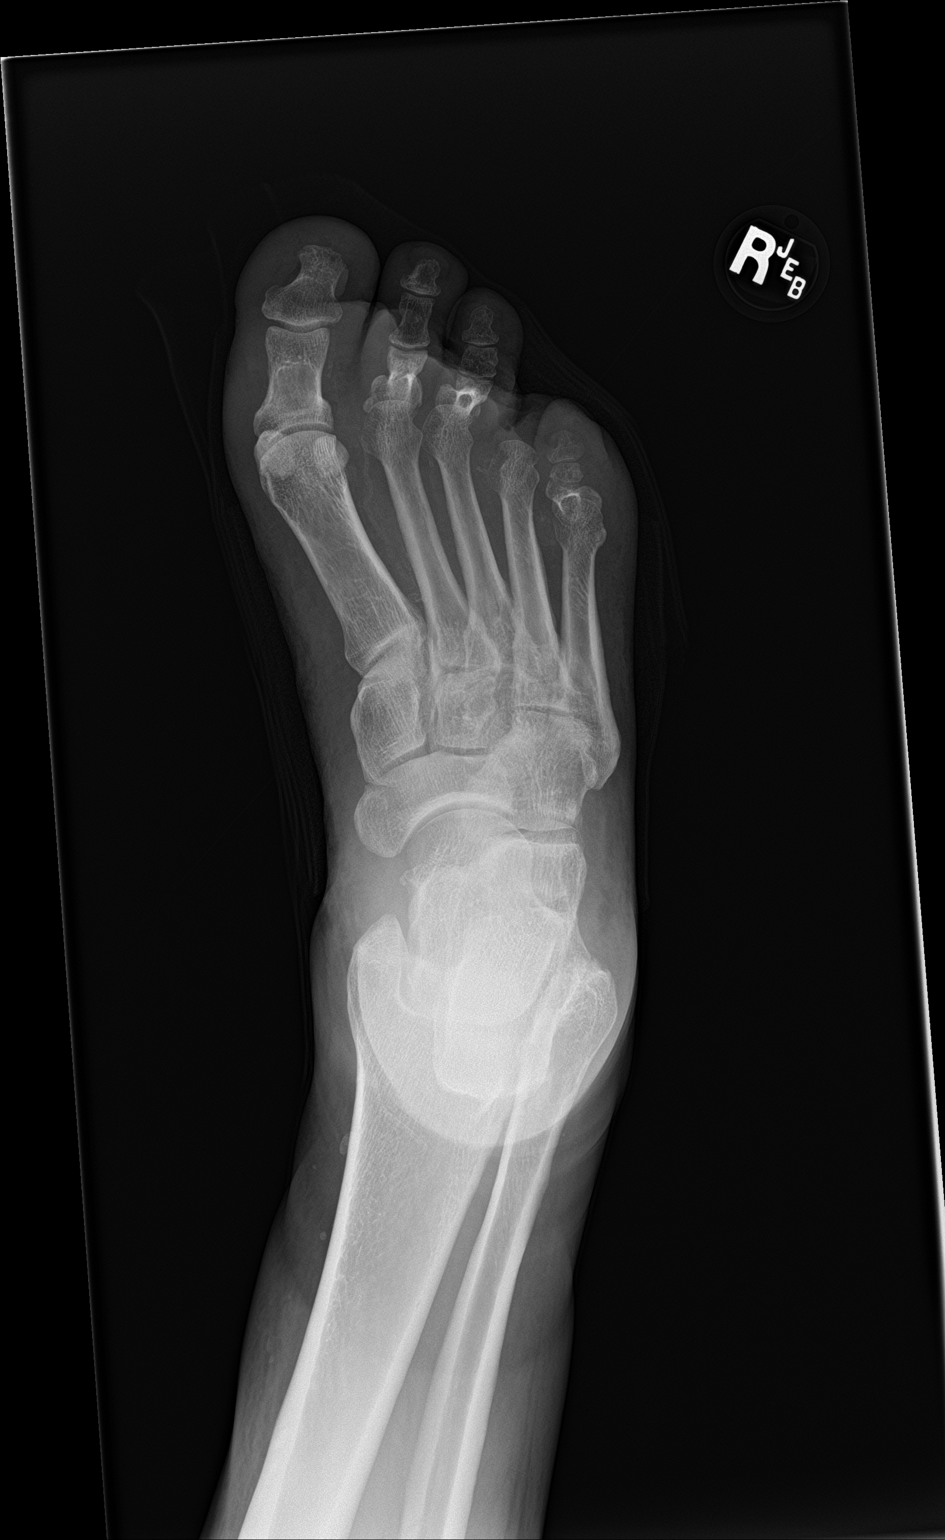

[3 of 3 positions shown; findings below may reference images not displayed]

FINDINGS: Interval fourth digit amputation at the level of the MTP. No
fracture seen. No periostitis or osseous destructive change.
Continued considerable soft tissue swelling over the distal foot.
IMPRESSION: 1. Interval amputation of fourth digit at the level of the MTP
joint.
2. Continued soft tissue swelling without acute osseous abnormality

## 2021-01-15 MED ORDER — POLYETHYLENE GLYCOL 3350 17 G PO PACK: 17.0000 g | PACK | Freq: Every day | ORAL | Status: DC | PRN

## 2021-01-15 MED ORDER — VANCOMYCIN HCL 1250 MG/250ML IV SOLN
1250.0000 mg | INTRAVENOUS | Status: DC
  Administered 2021-01-16 – 2021-01-17 (×3): 1250 mg via INTRAVENOUS
  Filled 2021-01-15 (×3): qty 250

## 2021-01-15 MED ORDER — SODIUM CHLORIDE 0.9 % IV BOLUS
1000.0000 mL | Freq: Once | INTRAVENOUS | Status: AC
  Administered 2021-01-15: 1000 mL via INTRAVENOUS

## 2021-01-15 MED ORDER — ACETAMINOPHEN 650 MG RE SUPP: 650.0000 mg | Freq: Four times a day (QID) | RECTAL | Status: DC | PRN

## 2021-01-15 MED ORDER — SODIUM CHLORIDE 0.9% FLUSH
3.0000 mL | Freq: Two times a day (BID) | INTRAVENOUS | Status: DC
  Administered 2021-01-16 – 2021-01-18 (×2): 3 mL via INTRAVENOUS

## 2021-01-15 MED ORDER — VANCOMYCIN HCL 2000 MG/400ML IV SOLN
2000.0000 mg | Freq: Once | INTRAVENOUS | Status: AC
  Administered 2021-01-15: 2000 mg via INTRAVENOUS
  Filled 2021-01-15: qty 400

## 2021-01-15 MED ORDER — PIPERACILLIN-TAZOBACTAM 3.375 G IVPB
3.3750 g | Freq: Three times a day (TID) | INTRAVENOUS | Status: DC
  Administered 2021-01-16 – 2021-01-18 (×6): 3.375 g via INTRAVENOUS
  Filled 2021-01-15 (×7): qty 50

## 2021-01-15 MED ORDER — ACETAMINOPHEN 325 MG PO TABS
650.0000 mg | ORAL_TABLET | Freq: Four times a day (QID) | ORAL | Status: DC | PRN
  Administered 2021-01-16: 650 mg via ORAL
  Filled 2021-01-15: qty 2

## 2021-01-15 MED ORDER — SODIUM CHLORIDE 0.9 % IV SOLN: INTRAVENOUS | Status: AC

## 2021-01-15 MED ORDER — PIPERACILLIN-TAZOBACTAM 3.375 G IVPB 30 MIN
3.3750 g | Freq: Once | INTRAVENOUS | Status: AC
  Administered 2021-01-15: 3.375 g via INTRAVENOUS
  Filled 2021-01-15: qty 50

## 2021-01-15 NOTE — ED Provider Notes (Signed)
Emergency Medicine Provider Triage Evaluation Note  Richard Pittman , a 50 y.o. male  was evaluated in triage.  Pt complains of right foot pain and open wound. Sent by podiatry due to concern for gangrene.  Review of Systems  Positive: Foot wound Negative: fever  Physical Exam  BP (!) 159/80 (BP Location: Left Arm)   Pulse 82   Temp 98.5 F (36.9 C)   Resp 16   SpO2 98%  Gen:   Awake, no distress   Resp:  Normal effort  MSK:   Moves extremities without difficulty  Other:  Wound and discoloration noted to the right foot, DP pulse dopplerable     Medical Decision Making  Medically screening exam initiated at 6:24 PM.  Appropriate orders placed.  Richard Pittman was informed that the remainder of the evaluation will be completed by another provider, this initial triage assessment does not replace that evaluation, and the importance of remaining in the ED until their evaluation is complete.     Richard Pittman 01/15/21 1824    Richard Nick, MD 01/16/21 1540

## 2021-01-15 NOTE — H&P (Addendum)
History and Physical   Erasmo Vertz UDJ:[id number] DOB: [birth year] DOA: 01/15/2021  PCP: [name], PA-C   Patient coming from: Home/podiatrist office  Chief Complaint: Foot pain, wound changes  HPI: Richard Pittman is a 50 y.o. male with medical history significant of foot infection, hyponatremia, anemia, diabetes presenting with foot changes in the setting of recent foot infection and amputation.  Patient had amputation earlier this week due to diabetic foot infection with debridement and amputation of the fourth digit.  Initially had been recovering well however today he had noticed bluish discoloration of his toes when he went for his podiatry follow-up that concerned for developing gangrene and sent the patient to the ED for IV antibiotics and further evaluation from vascular.  He states that the podiatrist who performed his amputation stated that the black areas on his toes actually had improved in their coloration.  He denies fevers, chills, chest pain, shortness of breath, abdominal pain, constipation, diarrhea, nausea, vomiting.   ED Course: Vital signs in the ED stable.  Lab work-up showed BMP with potassium 5.2, bicarb 20, creatinine elevated 1.48 from baseline around 1.  CBC showed leukocytosis of 11 which is stable or downtrending from previous.  Hemoglobin 10.6 which stable.  Lactic acid normal.  Respiratory panel for flu and COVID-negative.  Blood cultures sent and are pending.  X-ray of the right foot showed status post fourth digit amputation and soft tissue swelling noted without acute osseous abnormality.  Patient was started on vancomycin and Zosyn in the ED.  Review of Systems: As per HPI otherwise all other systems reviewed and are negative.  Past Medical History:  Diagnosis Date   Diabetes mellitus without complication [facility])     Past Surgical History:  Procedure Laterality Date   AMPUTATION TOE Right 01/10/2021   Procedure: 4TH TOE  AMPUTATION;  Surgeon: Park Liter, DPM;  Location: [facility];  Service: Podiatry;  Laterality: Right;   INCISION AND DRAINAGE Right 01/08/2021   Procedure: INCISION AND DRAINAGE;  Surgeon: Park Liter, DPM;  Location: [facility];  Service: Podiatry;  Laterality: Right;   WOUND DEBRIDEMENT Right 01/10/2021   Procedure: DEBRIDEMENT WOUND;  Surgeon: Park Liter, DPM;  Location: [facility];  Service: Podiatry;  Laterality: Right;    Social History  reports that he has never smoked. He has never used smokeless tobacco. He reports that he does not currently use alcohol. He reports that he does not use drugs.  No Known Allergies  Family History  Problem Relation Age of Onset   Diabetes Mother   Reviewed on admission  Prior to Admission medications   Medication Sig Start Date End Date Taking? Authorizing Provider  acetaminophen (TYLENOL) 500 MG tablet Take 1,000 mg by mouth every 6 (six) hours as needed for mild pain.    [provider]  collagenase (SANTYL) ointment Apply 1 application topically daily. Wound size 2.5 x 5 01/14/21   Park Liter, DPM  glipiZIDE (GLUCOTROL) 10 MG tablet Take 20 mg by mouth 2 (two) times daily before a meal.    [provider]  metFORMIN (GLUMETZA) 1000 MG (MOD) 24 hr tablet Take 1,000 mg by mouth 2 (two) times daily with a meal.    [provider]  naproxen (NAPROSYN) 500 MG tablet Take 1 tablet (500 mg total) by mouth 2 (two) times daily with a meal. 01/06/21   Wallis Bamberg, PA-C  sulfamethoxazole-trimethoprim (BACTRIM DS) 800-160 MG tablet Take 2 tablets by mouth every  12 (twelve) hours for 13 doses. 01/11/21 01/17/21  Kathrynn Running, MD    Physical Exam: Vitals:   01/15/21 1810 01/15/21 2107  BP: (!) 159/80 124/69  Pulse: 82 80  Resp: 16 (!) 21  Temp: 98.5 F (36.9 C)   SpO2: 98% 100%   Physical Exam Constitutional:      General: He is not in acute distress.    Appearance: Normal appearance.  HENT:     Head:  Normocephalic and atraumatic.     Mouth/Throat:     Mouth: Mucous membranes are moist.     Pharynx: Oropharynx is clear.  Eyes:     Extraocular Movements: Extraocular movements intact.     Pupils: Pupils are equal, round, and reactive to light.  Cardiovascular:     Rate and Rhythm: Normal rate and regular rhythm.     Pulses: Normal pulses.     Heart sounds: Normal heart sounds.  Pulmonary:     Effort: Pulmonary effort is normal. No respiratory distress.     Breath sounds: Normal breath sounds.  Abdominal:     General: Bowel sounds are normal. There is no distension.     Palpations: Abdomen is soft.     Tenderness: There is no abdominal tenderness.  Musculoskeletal:        General: No swelling.     Comments: Open wound at right lower extremity.  Had purulent drainage.  Bluish discoloration to digit.  Skin:    General: Skin is warm and dry.  Neurological:     General: No focal deficit present.     Mental Status: Mental status is at baseline.      Labs on Admission: I have personally reviewed following labs and imaging studies  CBC: Recent Labs  Lab 01/10/21 0444 01/11/21 0529 01/15/21 1828  WBC 14.2* 11.8* 11.0*  NEUTROABS  --   --  8.1*  HGB 9.5* 9.5* 10.6*  HCT 29.2* 29.1* 34.0*  MCV 80.0 80.2 81.7  PLT 282 295 391    Basic Metabolic Panel: Recent Labs  Lab 01/10/21 0444 01/11/21 0529 01/15/21 1828  NA 131* 133* 135  K 4.6 4.1 5.2*  CL 106 105 106  CO2 18* 20* 20*  GLUCOSE 211* 156* 81  BUN 21* 18 18  CREATININE 0.96 0.91 1.48*  CALCIUM 7.9* 8.0* 8.6*    GFR: Estimated Creatinine Clearance: 69.5 mL/min (A) (by C-G formula based on SCr of 1.48 mg/dL (H)).  Liver Function Tests: No results for input(s): AST, ALT, ALKPHOS, BILITOT, PROT, ALBUMIN in the last 168 hours.  Urine analysis:    Component Value Date/Time   COLORURINE YELLOW 07/17/2017 1259   APPEARANCEUR CLEAR 07/17/2017 1259   LABSPEC 1.032 (H) 07/17/2017 1259   PHURINE 5.0 07/17/2017  1259   GLUCOSEU >=500 (A) 07/17/2017 1259   HGBUR SMALL (A) 07/17/2017 1259   BILIRUBINUR NEGATIVE 07/17/2017 1259   KETONESUR 20 (A) 07/17/2017 1259   PROTEINUR >=300 (A) 07/17/2017 1259   NITRITE NEGATIVE 07/17/2017 1259   LEUKOCYTESUR NEGATIVE 07/17/2017 1259    Radiological Exams on Admission: DG Foot Complete Right  Result Date: 01/15/2021 CLINICAL DATA:  Foot infection redness to the third and second toe EXAM: RIGHT FOOT COMPLETE - 3+ VIEW COMPARISON:  01/10/2021 FINDINGS: Interval fourth digit amputation at the level of the MTP. No fracture seen. No periostitis or osseous destructive change. Continued considerable soft tissue swelling over the distal foot. IMPRESSION: 1. Interval amputation of fourth digit at the level of the MTP joint.  2. Continued soft tissue swelling without acute osseous abnormality Electronically Signed   By: Jasmine Pang M.D.   On: 01/15/2021 20:20    EKG: Not performed in the ED.  Assessment/Plan Principal Problem:   Diabetic foot infection (HCC)  Diabetic foot infection ?Gangrene > Known diabetic foot infection.  Followed by podiatry, had amputation of fourth digit with debridement earlier this week.  At follow-up visit noted to have bluish discoloration of the digits and purulent drainage concern for developing gangrene sent to the ED for IV antibiotics and vascular surgery evaluation. > Vascular surgery not consulted in the ED as patient has palpable pedal pulses and foot/toes feel warm. > Started on vancomycin and Zosyn in the ED. - Vascular surgery consult in the morning - Monitor on telemetry -Continue with vancomycin and Zosyn as this is already been started in the ED, will likely be able to narrow tomorrow. - We will start with ABIs as recommended by podiatry, will see if vascular surgery recommends anything further.    AKI Hyperkalemia > Creatinine elevated to 1.48 from baseline around 1 in the ED.  Potassium also noted to be 5.2. - 1 L IV  fluids and started on a rate overnight. - Avoid nephrotoxic agents - Trend renal function and electrolytes  Diabetes - SSI  Anemia > Hemoglobin stable at 10.6 - Continue to monitor  DVT prophylaxis: SCDs  Code Status:   Full Family Communication:  Significant other updated at bedside Disposition Plan:   Patient is from:  Home  Anticipated DC to:  Home  Anticipated DC date:  1 to 5 days  Anticipated DC barriers: None  Consults called:  None, will benefit from vascular surgery in the morning per podiatry recommendations, may additionally need podiatry consult if they do not see the patient tomorrow pending vascular surgery recommendations  Admission status:  Observation, telemetry   Severity of Illness: The appropriate patient status for this patient is OBSERVATION. Observation status is judged to be reasonable and necessary in order to provide the required intensity of service to ensure the patient's safety. The patient's presenting symptoms, physical exam findings, and initial radiographic and laboratory data in the context of their medical condition is felt to place them at decreased risk for further clinical deterioration. Furthermore, it is anticipated that the patient will be medically stable for discharge from the hospital within 2 midnights of admission.    Synetta Fail MD [facility]  How to contact the [facility] Attending or Consulting provider 7A - 7P or covering provider during after hours 7P -7A, for this patient?   Check the care team in [facility] and look for a) attending/consulting [facility] provider listed and b) the [facility] team listed Log into [email or web address] and use [place]'s universal password to access. If you do not have the password, please contact the hospital operator. Locate the [facility] provider you are looking for under [facility] and page to a number that you can be directly reached. If you still have difficulty reaching the provider, please page the [facility]  (Director on Call) for the Hospitalists listed on amion for assistance.  01/15/2021, 11:08 PM

## 2021-01-15 NOTE — ED Provider Notes (Signed)
[facility] EMERGENCY DEPARTMENT Provider Note   CSN: [id number] Arrival date & time: 01/15/21  1520     History No chief complaint on file.   Richard Pittman is a 50 y.o. male presenting for evaluation of worsening R foot infection.   Pt states he is having worsening foot infection.  He initially had issues with his foot about 2 weeks ago (from a puncture wound).  He had surgery on his foot at [facility] on 11-23.  Patient was seen in the office yesterday, thought to be healing well.  However today he had worsening discoloration of his great toe and swelling of his foot.  He was seen in the office today, there is concern for worsening infection and possible gangrene.  Sent to the ER for IV antibiotics and admission.  Patient denies significant pain.  No fevers, nausea, vomiting, weakness, or confusion.  He has been on Bactrim without improvement of symptoms.   HPI     Past Medical History:  Diagnosis Date   Diabetes mellitus without complication [facility])     Patient Active Problem List   Diagnosis Date Noted   Diabetic foot infection (HCC) 01/15/2021   Foot infection 01/07/2021   Uncontrolled type 2 diabetes mellitus with hyperglycemia (HCC) 01/07/2021   AKI (acute kidney injury) (HCC) 01/07/2021   Metabolic acidosis 01/07/2021   Hyponatremia 01/07/2021   Microcytic anemia 01/07/2021   Obesity (BMI 30-39.9) 01/07/2021    Past Surgical History:  Procedure Laterality Date   AMPUTATION TOE Right 01/10/2021   Procedure: 4TH TOE AMPUTATION;  Surgeon: Park Liter, DPM;  Location: [facility];  Service: Podiatry;  Laterality: Right;   INCISION AND DRAINAGE Right 01/08/2021   Procedure: INCISION AND DRAINAGE;  Surgeon: Park Liter, DPM;  Location: [facility];  Service: Podiatry;  Laterality: Right;   WOUND DEBRIDEMENT Right 01/10/2021   Procedure: DEBRIDEMENT WOUND;  Surgeon: Park Liter, DPM;  Location: [facility];  Service: Podiatry;  Laterality:  Right;       Family History  Problem Relation Age of Onset   Diabetes Mother     Social History   Tobacco Use   Smoking status: Never   Smokeless tobacco: Never  Vaping Use   Vaping Use: Never used  Substance Use Topics   Alcohol use: Not Currently   Drug use: Never    Home Medications Prior to Admission medications   Medication Sig Start Date End Date Taking? Authorizing Provider  acetaminophen (TYLENOL) 500 MG tablet Take 1,000 mg by mouth every 6 (six) hours as needed for mild pain.    [provider]  collagenase (SANTYL) ointment Apply 1 application topically daily. Wound size 2.5 x 5 01/14/21   Park Liter, DPM  glipiZIDE (GLUCOTROL) 10 MG tablet Take 20 mg by mouth 2 (two) times daily before a meal.    [provider]  metFORMIN (GLUMETZA) 1000 MG (MOD) 24 hr tablet Take 1,000 mg by mouth 2 (two) times daily with a meal.    [provider]  naproxen (NAPROSYN) 500 MG tablet Take 1 tablet (500 mg total) by mouth 2 (two) times daily with a meal. 01/06/21   Wallis Bamberg, PA-C  sulfamethoxazole-trimethoprim (BACTRIM DS) 800-160 MG tablet Take 2 tablets by mouth every 12 (twelve) hours for 13 doses. 01/11/21 01/17/21  Kathrynn Running, MD    Allergies    Patient has no known allergies.  Review of Systems   Review of Systems  Musculoskeletal:  Positive for joint swelling.  Skin:  Positive for color change and wound.  All other systems reviewed and are negative.  Physical Exam Updated Vital Signs BP 124/69 (BP Location: Left Arm)   Pulse 80   Temp 98.5 F (36.9 C)   Resp (!) 21   SpO2 100%   Physical Exam Vitals and nursing note reviewed.  Constitutional:      General: He is not in acute distress.    Appearance: Normal appearance.     Comments: Resting in the bed in NAD  HENT:     Head: Normocephalic and atraumatic.  Eyes:     Conjunctiva/sclera: Conjunctivae normal.     Pupils: Pupils are equal, round, and reactive to  light.  Cardiovascular:     Rate and Rhythm: Normal rate and regular rhythm.     Pulses: Normal pulses.  Pulmonary:     Effort: Pulmonary effort is normal. No respiratory distress.     Breath sounds: Normal breath sounds. No wheezing.     Comments: Speaking in full sentences.  Clear lung sounds in all fields. Abdominal:     General: There is no distension.     Palpations: Abdomen is soft. There is no mass.     Tenderness: There is no abdominal tenderness. There is no guarding or rebound.  Musculoskeletal:        General: Swelling present.     Cervical back: Normal range of motion and neck supple.     Comments: Swelling of the right foot when compared to the left.  Status post fourth toe amputation with granulation tissue and no purulent drainage.  Darkening of the right great toe which patient states is worse.  Pedal pulse present.  There is necrotic tissue of the right third toe which patient states is unchanged.  Skin:    General: Skin is warm and dry.     Capillary Refill: Capillary refill takes less than 2 seconds.  Neurological:     Mental Status: He is alert and oriented to person, place, and time.  Psychiatric:        Mood and Affect: Mood and affect normal.        Speech: Speech normal.        Behavior: Behavior normal.      ED Results / Procedures / Treatments   Labs (all labs ordered are listed, but only abnormal results are displayed) Labs Reviewed  CBC WITH DIFFERENTIAL/PLATELET - Abnormal; Notable for the following components:      Result Value   WBC 11.0 (*)    RBC 4.16 (*)    Hemoglobin 10.6 (*)    HCT 34.0 (*)    MCH 25.5 (*)    Neutro Abs 8.1 (*)    Abs Immature Granulocytes 0.15 (*)    All other components within normal limits  BASIC METABOLIC PANEL - Abnormal; Notable for the following components:   Potassium 5.2 (*)    CO2 20 (*)    Creatinine, Ser 1.48 (*)    Calcium 8.6 (*)    GFR, Estimated 57 (*)    All other components within normal limits   RESP PANEL BY RT-PCR (FLU A&B, COVID) ARPGX2  CULTURE, BLOOD (SINGLE)  LACTIC ACID, PLASMA  LACTIC ACID, PLASMA  BASIC METABOLIC PANEL  CBC    EKG None  Radiology DG Foot Complete Right  Result Date: 01/15/2021 CLINICAL DATA:  Foot infection redness to the third and second toe EXAM: RIGHT FOOT COMPLETE - 3+ VIEW COMPARISON:  01/10/2021 FINDINGS: Interval fourth digit amputation at the level of the MTP. No fracture seen. No periostitis or osseous destructive change. Continued considerable soft tissue swelling over the distal foot. IMPRESSION: 1. Interval amputation of fourth digit at the level of the MTP joint. 2. Continued soft tissue swelling without acute osseous abnormality Electronically Signed   By: Jasmine Pang M.D.   On: 01/15/2021 20:20    Procedures Procedures   Medications Ordered in ED Medications  piperacillin-tazobactam (ZOSYN) IVPB 3.375 g (has no administration in time range)    Followed by  piperacillin-tazobactam (ZOSYN) IVPB 3.375 g (has no administration in time range)  vancomycin (VANCOREADY) IVPB 2000 mg/400 mL (has no administration in time range)    Followed by  vancomycin (VANCOREADY) IVPB 1250 mg/250 mL (has no administration in time range)  sodium chloride 0.9 % bolus 1,000 mL (has no administration in time range)  sodium chloride flush (NS) 0.9 % injection 3 mL (has no administration in time range)  0.9 %  sodium chloride infusion (has no administration in time range)  acetaminophen (TYLENOL) tablet 650 mg (has no administration in time range)    Or  acetaminophen (TYLENOL) suppository 650 mg (has no administration in time range)  polyethylene glycol (MIRALAX / GLYCOLAX) packet 17 g (has no administration in time range)    ED Course  I have reviewed the triage vital signs and the nursing notes.  Pertinent labs & imaging results that were available during my care of the patient were reviewed by me and considered in my medical decision making (see  chart for details).    MDM Rules/Calculators/A&P                           Patient presenting for evaluation of worsening right foot infection.  Failed outpatient antibiotics.  On exam, patient peers nontoxic.  He is not septic.  Sent in by podiatry for IV antibiotics due to worsening infection.  Labs attained from triage interpreted by me, shows mild leukocytosis, but no elevated lactic.  Worsening kidney function, will give fluids.  Potassium is mildly elevated at 5.2, this is possibly due to hemolysis.  However as it is only minimally elevated, can continue to trend.  X-ray viewed and independently interpreted by me, does not show evidence of osteomyelitis at this time.  Will call for admission.  Discussed with Dr. Alinda Money from [facility], pt to be admitted.  Final Clinical Impression(s) / ED Diagnoses Final diagnoses:  Foot infection  Elevated serum creatinine    Rx / DC Orders ED Discharge Orders     None        Alveria Apley, PA-C 01/15/21 2259    Wynetta Fines, MD 01/19/21 [phone]

## 2021-01-15 NOTE — ED Triage Notes (Signed)
Pt here from home for foot infection. Pt had 4th toe amputation on L foot on Friday, pt noticed redness to 3rd and 2nd toes, but today his big toe was red and had some red-tinged drainage. Pt denies fever, chills, N/V.

## 2021-01-15 NOTE — Progress Notes (Signed)
Pharmacy Antibiotic Note  Richard Pittman is a 50 y.o. male admitted on 01/15/2021 with  foot infection .  Pharmacy has been consulted for vancomycin and zosyn dosing.  Patient with a history of foot infection, hyponatremia, anemia, diabetes. Patient presenting with foot infection. Recent admission for same.  SCr 1.48 - above baseline WBC 11; LA 0.9; T 98.5 F  Plan: Zosyn 3.375g IV q8h (4 hour infusion) Vancomycin 2000 mg once then 1250 mg q24hr (eAUC 457.3) unless change in renal function Trend WBC, Fever, Renal function, & Clinical course F/u cultures, clinical course, WBC, fever De-escalate when able Levels at steady state     Temp (24hrs), Avg:98.5 F (36.9 C), Min:98.5 F (36.9 C), Max:98.5 F (36.9 C)  Recent Labs  Lab 01/10/21 0444 01/11/21 0529 01/15/21 1828  WBC 14.2* 11.8* 11.0*  CREATININE 0.96 0.91 1.48*  LATICACIDVEN  --   --  0.9    Estimated Creatinine Clearance: 69.5 mL/min (A) (by C-G formula based on SCr of 1.48 mg/dL (H)).    No Known Allergies  Antimicrobials this admission: zosyn 11/30 >>  vancomycin 11/30 >>   Microbiology results: Pending  Thank you for allowing pharmacy to be a part of this patient's care.  Delmar Landau, PharmD, BCPS 01/15/2021 10:29 PM ED Clinical Pharmacist -  [phone]

## 2021-01-15 NOTE — Progress Notes (Signed)
Subjective:  Patient ID: Richard Pittman, male    DOB: 1970-11-13,  MRN: [id number]  Chief Complaint  Patient presents with   Toe Pain    Great toe oozing pus  Worried about losing toe  /  toe is purple/blue in color     50 y.o. male presents with the above complaint.  Patient presents today for worsening of the digits with bluish and dark discoloration consistent with beginning signs of gangrene.  Patient had previous surgery done by Dr. [name] earlier this week for debridement of the wound with amputation of the fourth digit.  This was after he had undergone a puncture wound.  He was seen in clinic today with worsening discoloration of toes.  He has not had a vascular work-up done he denies any other acute complaints.  Him and his wife are both worried that he may end up losing his foot.  He is a diabetic with last A1c of 8.1.   Review of Systems: Negative except as noted in the HPI. Denies N/V/F/Ch.  Past Medical History:  Diagnosis Date   Diabetes mellitus without complication (HCC)     Current Outpatient Medications:    acetaminophen (TYLENOL) 500 MG tablet, Take 1,000 mg by mouth every 6 (six) hours as needed for mild pain., Disp: , Rfl:    collagenase (SANTYL) ointment, Apply 1 application topically daily. Wound size 2.5 x 5, Disp: 15 g, Rfl: 0   glipiZIDE (GLUCOTROL) 10 MG tablet, Take 20 mg by mouth 2 (two) times daily before a meal., Disp: , Rfl:    metFORMIN (GLUMETZA) 1000 MG (MOD) 24 hr tablet, Take 1,000 mg by mouth 2 (two) times daily with a meal., Disp: , Rfl:    naproxen (NAPROSYN) 500 MG tablet, Take 1 tablet (500 mg total) by mouth 2 (two) times daily with a meal., Disp: 30 tablet, Rfl: 0   sulfamethoxazole-trimethoprim (BACTRIM DS) 800-160 MG tablet, Take 2 tablets by mouth every 12 (twelve) hours for 13 doses., Disp: 26 tablet, Rfl: 0  Social History   Tobacco Use  Smoking Status Never  Smokeless Tobacco Never    No Known Allergies Objective:  There  were no vitals filed for this visit. There is no height or weight on file to calculate BMI. Constitutional Well developed. Well nourished.  Vascular Dorsalis pedis pulses faintly palpable right Posterior tibial pulses none palpable right No cap refill noted to the remaining digits to the right foot No cyanosis or clubbing noted. Pedal hair growth normal.  Neurologic Normal speech. Oriented to person, place, and time. Decreased ensation to light touch grossly present bilaterally.  Dermatologic Previous surgical wound noted to the plantar aspect of the right foot.  No purulent drainage was expressed.  No cellulitis noted.  Discoloration noted to all digits with beginning signs of gangrene.  No malodor present.  At this time it is actively demarcating.  No wet gangrene noted  Orthopedic: History of amputation of fourth digit.   Radiographs: None Assessment:   1. Gangrene (HCC)   2. Foot infection   3. Uncontrolled type 2 diabetes mellitus with hyperglycemia (HCC)       Plan:  Patient was evaluated and treated and all questions answered.  Right foot diabetic infection with underlying beginning signs of gangrene to the digits -All questions and concerns were discussed with the patient in extensive detail. -Patient will benefit from readmission to the hospital for IV antibiotics and vascular work-up with ABIs PVRs.  He is a high risk  of gangrene as it is demarcating actively to the midfoot. -Continue Betadine wet-to-dry dressing change daily -Partial weightbearing to the heel -I will continue to monitor the patient however at this time he does not need any acute surgical intervention.  We will await vascular input prior to any further intervention. -We will continue to monitor the demarcation.  No follow-ups on file.

## 2021-01-15 NOTE — Telephone Encounter (Signed)
Patient son stated that after they got home yesterday from their appointment with Dr. [name], [facility] great toe on the right foot started oozing.   Please advise

## 2021-01-15 NOTE — Telephone Encounter (Signed)
Patient son called back and stated that he wanted his father to be seen today. He is scheduled with Dr. Allena Katz today at 2:45. Muaad son would like Dr. [name] to give him a call as soon as he can.

## 2021-01-16 ENCOUNTER — Observation Stay (HOSPITAL_COMMUNITY): Payer: Self-pay

## 2021-01-16 DIAGNOSIS — Z7984 Long term (current) use of oral hypoglycemic drugs: Secondary | ICD-10-CM

## 2021-01-16 DIAGNOSIS — L089 Local infection of the skin and subcutaneous tissue, unspecified: Secondary | ICD-10-CM

## 2021-01-16 DIAGNOSIS — E11628 Type 2 diabetes mellitus with other skin complications: Secondary | ICD-10-CM

## 2021-01-16 DIAGNOSIS — Z794 Long term (current) use of insulin: Secondary | ICD-10-CM

## 2021-01-16 DIAGNOSIS — L0390 Cellulitis, unspecified: Secondary | ICD-10-CM

## 2021-01-16 DIAGNOSIS — Z79899 Other long term (current) drug therapy: Secondary | ICD-10-CM

## 2021-01-16 DIAGNOSIS — L97519 Non-pressure chronic ulcer of other part of right foot with unspecified severity: Secondary | ICD-10-CM

## 2021-01-16 DIAGNOSIS — E11621 Type 2 diabetes mellitus with foot ulcer: Secondary | ICD-10-CM

## 2021-01-16 LAB — CBC
HCT: 31.5 % — ABNORMAL LOW (ref 39.0–52.0)
Hemoglobin: 9.8 g/dL — ABNORMAL LOW (ref 13.0–17.0)
MCH: 25.5 pg — ABNORMAL LOW (ref 26.0–34.0)
MCHC: 31.1 g/dL (ref 30.0–36.0)
MCV: 81.8 fL (ref 80.0–100.0)
Platelets: 363 10*3/uL (ref 150–400)
RBC: 3.85 MIL/uL — ABNORMAL LOW (ref 4.22–5.81)
RDW: 14 % (ref 11.5–15.5)
WBC: 10.2 10*3/uL (ref 4.0–10.5)
nRBC: 0 % (ref 0.0–0.2)

## 2021-01-16 LAB — LACTIC ACID, PLASMA: Lactic Acid, Venous: 0.6 mmol/L (ref 0.5–1.9)

## 2021-01-16 LAB — BASIC METABOLIC PANEL
Anion gap: 9 (ref 5–15)
BUN: 16 mg/dL (ref 6–20)
CO2: 18 mmol/L — ABNORMAL LOW (ref 22–32)
Calcium: 8.1 mg/dL — ABNORMAL LOW (ref 8.9–10.3)
Chloride: 108 mmol/L (ref 98–111)
Creatinine, Ser: 1.34 mg/dL — ABNORMAL HIGH (ref 0.61–1.24)
GFR, Estimated: >60 mL/min (ref >60)
Glucose, Bld: 74 mg/dL (ref 70–99)
Potassium: 4.9 mmol/L (ref 3.5–5.1)
Sodium: 135 mmol/L (ref 135–145)

## 2021-01-16 LAB — GLUCOSE, CAPILLARY
Glucose-Capillary: 104 mg/dL — ABNORMAL HIGH (ref 70–99)
Glucose-Capillary: 142 mg/dL — ABNORMAL HIGH (ref 70–99)
Glucose-Capillary: 154 mg/dL — ABNORMAL HIGH (ref 70–99)

## 2021-01-16 MED ORDER — POLYETHYLENE GLYCOL 3350 17 G PO PACK
17.0000 g | PACK | Freq: Every day | ORAL | Status: DC
  Administered 2021-01-18: 17 g via ORAL
  Filled 2021-01-16 (×2): qty 1

## 2021-01-16 MED ORDER — SODIUM CHLORIDE 0.9 % IV SOLN: INTRAVENOUS | Status: DC

## 2021-01-16 MED ORDER — GLUCERNA SHAKE PO LIQD
237.0000 mL | ORAL | Status: DC
  Administered 2021-01-16 – 2021-01-18 (×2): 237 mL via ORAL

## 2021-01-16 MED ORDER — INSULIN ASPART 100 UNIT/ML IJ SOLN: 0.0000 [IU] | Freq: Every day | INTRAMUSCULAR | Status: DC

## 2021-01-16 MED ORDER — INSULIN ASPART 100 UNIT/ML IJ SOLN
0.0000 [IU] | Freq: Three times a day (TID) | INTRAMUSCULAR | Status: DC
  Administered 2021-01-16 – 2021-01-17 (×2): 1 [IU] via SUBCUTANEOUS
  Administered 2021-01-17 – 2021-01-18 (×2): 2 [IU] via SUBCUTANEOUS
  Administered 2021-01-18: 1 [IU] via SUBCUTANEOUS

## 2021-01-16 MED ORDER — POTASSIUM CHLORIDE 10 MEQ/100ML IV SOLN
INTRAVENOUS | Status: AC
  Filled 2021-01-16: qty 200

## 2021-01-16 NOTE — Consult Note (Signed)
  Subjective:  Patient ID: Richard Pittman, male    DOB: 16-Oct-1970,  MRN: [id number]  A 50 y.o. male with past medical history significant of foot infection, hyponatremia, anemia, diabetes presents with worsening discoloration of the digits.  Patient had undergone amputation by Dr. [name] and was recovering well from it however started developing bluish discoloration/beginning signs of gangrene of the digits.  Patient per patient and his wife the discoloration has gotten worse when I saw him in clinic yesterday.  He states that he was having some pain in the foot. Objective:   Vitals:   01/16/21 0607 01/16/21 0853  BP: 139/77 (!) 143/76  Pulse: 81 85  Resp: 16 18  Temp: 98 F (36.7 C) 97.8 F (36.6 C)  SpO2: 98% 97%   General AA&O x3. Normal mood and affect.  Vascular Dorsalis pedis palpable 2 out of 4 bilaterally.  Posterior tibial pulse diminished bilaterally Diminished cap refill to all the remaining digits on the right foot pedal hair not present.  Neurologic Decreased sensation grossly intact.  Dermatologic Previous surgical wound noted to the plantar aspect of the right foot.  No purulent drainage was expressed.  No cellulitis noted.  Discoloration noted to all digits with beginning signs of gangrene.  No malodor present.  At this time it is actively demarcating.  No wet gangrene noted  Orthopedic: History of toe amputation fourth digit.    Assessment & Plan:  Patient was evaluated and treated and all questions answered.  Right plantar soft tissue ulceration with concerning for gangrene of the remaining digits -All questions and concerns were discussed with the patient in extensive detail. -Await ABIs and vascular work-up. -No acute OR plans from podiatric standpoint at this time. -We will continue to monitor for demarcation especially in the setting of no vascular intervention. -Partial weightbearing to the heel -Continue weight Betadine wet-to-dry dressing  Candelaria Stagers, DPM  Accessible via secure chat for questions or concerns.

## 2021-01-16 NOTE — Progress Notes (Signed)
PROGRESS NOTE    Richard Pittman  WUJ:[id number] DOB: 1971/01/26 DOA: 01/15/2021 PCP: [name], PA-C   Brief Narrative: 50 year old with past medical history significant for diabetes, foot infection recent amputation of the fourth digit presents with worsening discoloration of the right foot.  Right foot infection  started after patient stepped on a nail  at a construction site.  He developed an abscess at the trauma site and subsequently underwent incision and drainage by Dr. [name] on 01/08/2021.  Subsequently 48 hours later he underwent fourth toe amputation.  He went for follow-up to his podiatrist and was then refer for admission for further evaluation by vascular surgery, due to right foot changes consistent with vascular insufficiency.  Assessment & Plan:   Principal Problem:   Diabetic foot infection (HCC) Active Problems:   Foot infection   Uncontrolled type 2 diabetes mellitus with hyperglycemia (HCC)   AKI (acute kidney injury) (HCC)   Microcytic anemia   Hyperkalemia   1-Right foot wound, diabetic foot infection: -Patient wound infection started after he stepped on a nail at construction site.  -Recent hospitalization 11/23 for incision and drainage and subsequently fourth toe amputation. -He was referred for admission 11/30 for further evaluation of unhealing wound and changes consistent with vascular insufficiency. -Plan to continue with IV vancomycin and Zosyn.  -Vascular surgery consulted. ABI ordered.  -Plan for arteriogram 12/2 -Follow Blood culture.   2-AKI, hyperkalemia: Patient received IV fluids. Creatinine up to 1.4 on admission baseline of 1 Cr down to 1.3 Hyperkalemia resolved.   Diabetes: Continue with SSI. A1c; 8.1 He has been working on diet.  CBG well controlled.   Anemia: Plan to check anemia panel.      Estimated body mass index is 35.36 kg/m as calculated from the following:   Height as of 01/10/21: 5\' 8"  (1.727  m).   Weight as of 01/11/21: 105.5 kg.   DVT prophylaxis: SCD Code Status: Full code Family Communication: Care discussed with patient and wife Disposition Plan:  Status is: inpatient.     Consultants:  Vascular Podiatry   Procedures:  ABI  Antimicrobials:  Vancomycin Zosyn   Subjective: He is alert, report pain foot.  Discoloration has been getting worse.  He has been working on diet for diabetes. Eating less.   Objective: Vitals:   01/16/21 0400 01/16/21 0501 01/16/21 0607 01/16/21 0853  BP: 129/72 (!) 171/86 139/77 (!) 143/76  Pulse: 74 91 81 85  Resp: 18 16 16 18   Temp:  97.7 F (36.5 C) 98 F (36.7 C) 97.8 F (36.6 C)  TempSrc:  Temporal Oral Oral  SpO2: 97% 100% 98% 97%    Intake/Output Summary (Last 24 hours) at 01/16/2021 0949 Last data filed at 01/16/2021 0854 Gross per 24 hour  Intake 1147.08 ml  Output --  Net 1147.08 ml   There were no vitals filed for this visit.  Examination:  General exam: Appears calm and comfortable  Respiratory system: Clear to auscultation. Respiratory effort normal. Cardiovascular system: S1 & S2 heard, RRR. No JVD, murmurs, rubs, gallops or clicks. No pedal edema. Gastrointestinal system: Abdomen is nondistended, soft and nontender. No organomegaly or masses felt. Normal bowel sounds heard. Central nervous system: Alert and oriented. No focal neurological deficits. Extremities: Right foot S/P 4 toe amputation, open wound, discoloration.   Data Reviewed: I have personally reviewed following labs and imaging studies  CBC: Recent Labs  Lab 01/10/21 0444 01/11/21 0529 01/15/21 1828 01/16/21 0302  WBC 14.2* 11.8* 11.0*  10.2  NEUTROABS  --   --  8.1*  --   HGB 9.5* 9.5* 10.6* 9.8*  HCT 29.2* 29.1* 34.0* 31.5*  MCV 80.0 80.2 81.7 81.8  PLT 282 295 391 363   Basic Metabolic Panel: Recent Labs  Lab 01/10/21 0444 01/11/21 0529 01/15/21 1828 01/16/21 0302  NA 131* 133* 135 135  K 4.6 4.1 5.2* 4.9  CL 106 105  106 108  CO2 18* 20* 20* 18*  GLUCOSE 211* 156* 81 74  BUN 21* 18 18 16   CREATININE 0.96 0.91 1.48* 1.34*  CALCIUM 7.9* 8.0* 8.6* 8.1*   GFR: Estimated Creatinine Clearance: 76.7 mL/min (A) (by C-G formula based on SCr of 1.34 mg/dL (H)). Liver Function Tests: No results for input(s): AST, ALT, ALKPHOS, BILITOT, PROT, ALBUMIN in the last 168 hours. No results for input(s): LIPASE, AMYLASE in the last 168 hours. No results for input(s): AMMONIA in the last 168 hours. Coagulation Profile: No results for input(s): INR, PROTIME in the last 168 hours. Cardiac Enzymes: No results for input(s): CKTOTAL, CKMB, CKMBINDEX, TROPONINI in the last 168 hours. BNP (last 3 results) No results for input(s): PROBNP in the last 8760 hours. HbA1C: No results for input(s): HGBA1C in the last 72 hours. CBG: Recent Labs  Lab 01/10/21 1629 01/10/21 2146 01/11/21 0758 01/11/21 1143 01/11/21 1625  GLUCAP 138* 145* 155* 147* 151*   Lipid Profile: No results for input(s): CHOL, HDL, LDLCALC, TRIG, CHOLHDL, LDLDIRECT in the last 72 hours. Thyroid Function Tests: No results for input(s): TSH, T4TOTAL, FREET4, T3FREE, THYROIDAB in the last 72 hours. Anemia Panel: No results for input(s): VITAMINB12, FOLATE, FERRITIN, TIBC, IRON, RETICCTPCT in the last 72 hours. Sepsis Labs: Recent Labs  Lab 01/15/21 1828 01/16/21 0305  LATICACIDVEN 0.9 0.6    Recent Results (from the past 240 hour(s))  Resp Panel by RT-PCR (Flu A&B, Covid) Nasopharyngeal Swab     Status: None   Collection Time: 01/07/21  5:50 PM   Specimen: Nasopharyngeal Swab; Nasopharyngeal(NP) swabs in vial transport medium  Result Value Ref Range Status   SARS Coronavirus 2 by RT PCR NEGATIVE NEGATIVE Final    Comment: (NOTE) SARS-CoV-2 target nucleic acids are NOT DETECTED.  The SARS-CoV-2 RNA is generally detectable in upper respiratory specimens during the acute phase of infection. The lowest concentration of SARS-CoV-2 viral copies  this assay can detect is 138 copies/mL. A negative result does not preclude SARS-Cov-2 infection and should not be used as the sole basis for treatment or other patient management decisions. A negative result may occur with  improper specimen collection/handling, submission of specimen other than nasopharyngeal swab, presence of viral mutation(s) within the areas targeted by this assay, and inadequate number of viral copies(<138 copies/mL). A negative result must be combined with clinical observations, patient history, and epidemiological information. The expected result is Negative.  Fact Sheet for Patients:  01/09/21  Fact Sheet for Healthcare Providers:  BloggerCourse.com  This test is no t yet approved or cleared by the [place] FDA and  has been authorized for detection and/or diagnosis of SARS-CoV-2 by FDA under an Emergency Use Authorization (EUA). This EUA will remain  in effect (meaning this test can be used) for the duration of the COVID-19 declaration under Section 564(b)(1) of the Act, 21 U.S.C.section 360bbb-3(b)(1), unless the authorization is terminated  or revoked sooner.       Influenza A by PCR NEGATIVE NEGATIVE Final   Influenza B by PCR NEGATIVE NEGATIVE Final    Comment: (  NOTE) The Xpert Xpress SARS-CoV-2/FLU/RSV plus assay is intended as an aid in the diagnosis of influenza from Nasopharyngeal swab specimens and should not be used as a sole basis for treatment. Nasal washings and aspirates are unacceptable for Xpert Xpress SARS-CoV-2/FLU/RSV testing.  Fact Sheet for Patients: BloggerCourse.com  Fact Sheet for Healthcare Providers: SeriousBroker.it  This test is not yet approved or cleared by the [place] FDA and has been authorized for detection and/or diagnosis of SARS-CoV-2 by FDA under an Emergency Use Authorization (EUA). This EUA  will remain in effect (meaning this test can be used) for the duration of the COVID-19 declaration under Section 564(b)(1) of the Act, 21 U.S.C. section 360bbb-3(b)(1), unless the authorization is terminated or revoked.  Performed at [facility], [address] [place], [place] [address]   Blood culture (routine x 2)     Status: None   Collection Time: 01/07/21  6:30 PM   Specimen: Left Antecubital; Blood  Result Value Ref Range Status   Specimen Description   Final    LEFT ANTECUBITAL Performed at [facility], [address] [place], [place] [address]    Special Requests   Final    BOTTLES DRAWN AEROBIC AND ANAEROBIC Blood Culture adequate volume Performed at [facility], [address] [place] [address], [place], [place] [address]    Culture   Final    NO GROWTH 5 DAYS Performed at [facility], [address], [place], [place] [address]    Report Status 01/12/2021 FINAL  Final  Blood culture (routine x 2)     Status: None   Collection Time: 01/07/21  6:34 PM   Specimen: Right Antecubital; Blood  Result Value Ref Range Status   Specimen Description   Final    RIGHT ANTECUBITAL Performed at [facility], [address], [place], [place] [address]    Special Requests   Final    BOTTLES DRAWN AEROBIC AND ANAEROBIC Blood Culture adequate volume Performed at [facility], [address], [place], [place] [address]    Culture   Final    NO GROWTH 5 DAYS Performed at [facility], [address], [place], [place] [address]    Report Status 01/12/2021 FINAL  Final  Aerobic/Anaerobic Culture w Gram Stain (surgical/deep wound)     Status: None   Collection Time: 01/08/21  1:27 PM   Specimen: PATH Soft tissue  Result Value Ref Range Status   Specimen Description   Final    TISSUE RIGHT FOOT Performed at [facility], [address] [place], [place] [address]    Special  Requests   Final    NONE Performed at [facility], [address], [place], [place] [address]    Gram Stain   Final    FEW SQUAMOUS EPITHELIAL CELLS PRESENT FEW WBC PRESENT, PREDOMINANTLY MONONUCLEAR MODERATE GRAM POSITIVE COCCI FEW GRAM NEGATIVE RODS RARE GRAM POSITIVE RODS    Culture   Final    MODERATE STREPTOCOCCUS AGALACTIAE TESTING AGAINST S. AGALACTIAE NOT ROUTINELY PERFORMED DUE TO PREDICTABILITY OF AMP/PEN/VAN SUSCEPTIBILITY. FEW STAPHYLOCOCCUS AUREUS MODERATE STREPTOCOCCUS ANGINOSIS ABUNDANT PREVOTELLA BIVIA BETA LACTAMASE POSITIVE Performed at [facility], [address], [place], [place] [address]    Report Status 01/12/2021 FINAL  Final   Organism ID, Bacteria STAPHYLOCOCCUS AUREUS  Final   Organism ID, Bacteria STREPTOCOCCUS ANGINOSIS  Final      Susceptibility   Staphylococcus aureus -  MIC*    CIPROFLOXACIN <=0.5 SENSITIVE Sensitive     ERYTHROMYCIN <=0.25 SENSITIVE Sensitive     GENTAMICIN <=0.5 SENSITIVE Sensitive     OXACILLIN 0.5 SENSITIVE Sensitive     TETRACYCLINE <=1 SENSITIVE Sensitive     VANCOMYCIN 1 SENSITIVE Sensitive     TRIMETH/SULFA <=10 SENSITIVE Sensitive     CLINDAMYCIN <=0.25 SENSITIVE Sensitive     RIFAMPIN <=0.5 SENSITIVE Sensitive     Inducible Clindamycin NEGATIVE Sensitive     * FEW STAPHYLOCOCCUS AUREUS   Streptococcus anginosis - MIC*    PENICILLIN <=0.06 SENSITIVE Sensitive     CEFTRIAXONE <=0.12 SENSITIVE Sensitive     ERYTHROMYCIN <=0.12 SENSITIVE Sensitive     LEVOFLOXACIN 0.5 SENSITIVE Sensitive     VANCOMYCIN 0.25 SENSITIVE Sensitive     * MODERATE STREPTOCOCCUS ANGINOSIS  Culture, blood (single)     Status: None (Preliminary result)   Collection Time: 01/15/21  6:28 PM   Specimen: BLOOD  Result Value Ref Range Status   Specimen Description BLOOD SITE NOT SPECIFIED  Final   Special Requests   Final    BOTTLES DRAWN AEROBIC ONLY Blood Culture adequate volume   Culture   Final    NO GROWTH < 12  HOURS Performed at [facility], [address] [place], [place] [address]    Report Status PENDING  Incomplete  Resp Panel by RT-PCR (Flu A&B, Covid) Nasopharyngeal Swab     Status: None   Collection Time: 01/15/21  6:28 PM   Specimen: Nasopharyngeal Swab; Nasopharyngeal(NP) swabs in vial transport medium  Result Value Ref Range Status   SARS Coronavirus 2 by RT PCR NEGATIVE NEGATIVE Final    Comment: (NOTE) SARS-CoV-2 target nucleic acids are NOT DETECTED.  The SARS-CoV-2 RNA is generally detectable in upper respiratory specimens during the acute phase of infection. The lowest concentration of SARS-CoV-2 viral copies this assay can detect is 138 copies/mL. A negative result does not preclude SARS-Cov-2 infection and should not be used as the sole basis for treatment or other patient management decisions. A negative result may occur with  improper specimen collection/handling, submission of specimen other than nasopharyngeal swab, presence of viral mutation(s) within the areas targeted by this assay, and inadequate number of viral copies(<138 copies/mL). A negative result must be combined with clinical observations, patient history, and epidemiological information. The expected result is Negative.  Fact Sheet for Patients:  BloggerCourse.com  Fact Sheet for Healthcare Providers:  [employer]iousBroker.it  This test is no t yet approved or cleared by the [place] FDA and  has been authorized for detection and/or diagnosis of SARS-CoV-2 by FDA under an Emergency Use Authorization (EUA). This EUA will remain  in effect (meaning this test can be used) for the duration of the COVID-19 declaration under Section 564(b)(1) of the Act, 21 U.S.C.section 360bbb-3(b)(1), unless the authorization is terminated  or revoked sooner.       Influenza A by PCR NEGATIVE NEGATIVE Final   Influenza B by PCR NEGATIVE NEGATIVE Final    Comment:  (NOTE) The Xpert Xpress SARS-CoV-2/FLU/RSV plus assay is intended as an aid in the diagnosis of influenza from Nasopharyngeal swab specimens and should not be used as a sole basis for treatment. Nasal washings and aspirates are unacceptable for Xpert Xpress SARS-CoV-2/FLU/RSV testing.  Fact Sheet for Patients: BloggerCourse.com  Fact Sheet for Healthcare Providers: SeriousBroker.it  This test is not yet approved or cleared by the [place] FDA and has been authorized for detection and/or diagnosis  of SARS-CoV-2 by FDA under an Emergency Use Authorization (EUA). This EUA will remain in effect (meaning this test can be used) for the duration of the COVID-19 declaration under Section 564(b)(1) of the Act, 21 U.S.C. section 360bbb-3(b)(1), unless the authorization is terminated or revoked.  Performed at [facility], [address] [place], [place] [address]          Radiology Studies: DG Foot Complete Right  Result Date: 01/15/2021 CLINICAL DATA:  Foot infection redness to the third and second toe EXAM: RIGHT FOOT COMPLETE - 3+ VIEW COMPARISON:  01/10/2021 FINDINGS: Interval fourth digit amputation at the level of the MTP. No fracture seen. No periostitis or osseous destructive change. Continued considerable soft tissue swelling over the distal foot. IMPRESSION: 1. Interval amputation of fourth digit at the level of the MTP joint. 2. Continued soft tissue swelling without acute osseous abnormality Electronically Signed   By: Jasmine Pang M.D.   On: 01/15/2021 20:20        Scheduled Meds:  feeding supplement (GLUCERNA SHAKE)  237 mL Oral Q24H   insulin aspart  0-5 Units Subcutaneous QHS   insulin aspart  0-9 Units Subcutaneous TID WC   polyethylene glycol  17 g Oral Daily   sodium chloride flush  3 mL Intravenous Q12H   Continuous Infusions:  piperacillin-tazobactam (ZOSYN)  IV 3.375 g (01/16/21 0614)    vancomycin       LOS: 0 days    Time spent: 35 minutes    Jia Dottavio A Kilo Eshelman, MD [facility]   If 7PM-7AM, please contact night-coverage [email or web address]  01/16/2021, 9:49 AM

## 2021-01-16 NOTE — ED Notes (Signed)
The pt has had recent surgery on his third  his rt foot I dark in color the pt reports that the rt great toe has been draining for  acouple days

## 2021-01-16 NOTE — ED Notes (Signed)
Resting wife at  the bedside

## 2021-01-16 NOTE — Progress Notes (Signed)
VASCULAR LAB    Right lower extremity arterial duplex has been performed.  See CV proc for preliminary results.   Dyesha Henault, RVT 01/16/2021, 11:15 AM

## 2021-01-16 NOTE — ED Notes (Signed)
Report given to jewel on [address]north

## 2021-01-16 NOTE — Progress Notes (Signed)
VASCULAR LAB    ABIs have been performed.  See CV proc for preliminary results.   Shahab Polhamus, RVT 01/16/2021, 11:15 AM

## 2021-01-16 NOTE — Consult Note (Addendum)
Hospital Consult    Reason for Consult:  tissue loss right foot Requesting Physician:  Dr. Sunnie Nielsen MRN #:  [id number]  History of Present Illness: This is a 50 y.o. male with past medical history significant for insulin-dependent diabetes mellitus with recent hemoglobin A1c of 8.1.  He is being seen in consultation for evaluation of nonhealing surgical wound of right foot.  This was all started due to stepping on a nail on a construction site.  He developed an abscess at the trauma site and underwent incision and drainage by podiatrist Dr. [name] on 01/08/2021.  He was brought back 48 hours later for debridement of the wound and fourth toe amputation.  He presented to the emergency department last night due to worsening appearing wound as well as discoloration of other toes of right foot.  R foot plain film negative for osteomyelitis.  He denies tobacco use.  He has been started on broad-spectrum antibiotics.  He has never had any claudication type pain in the past.  Past Medical History:  Diagnosis Date   Diabetes mellitus without complication [facility])     Past Surgical History:  Procedure Laterality Date   AMPUTATION TOE Right 01/10/2021   Procedure: 4TH TOE AMPUTATION;  Surgeon: Park Liter, DPM;  Location: [facility];  Service: Podiatry;  Laterality: Right;   INCISION AND DRAINAGE Right 01/08/2021   Procedure: INCISION AND DRAINAGE;  Surgeon: Park Liter, DPM;  Location: [facility];  Service: Podiatry;  Laterality: Right;   WOUND DEBRIDEMENT Right 01/10/2021   Procedure: DEBRIDEMENT WOUND;  Surgeon: Park Liter, DPM;  Location: [facility];  Service: Podiatry;  Laterality: Right;    No Known Allergies  Prior to Admission medications   Medication Sig Start Date End Date Taking? Authorizing Provider  acetaminophen (TYLENOL) 500 MG tablet Take 1,000 mg by mouth every 6 (six) hours as needed for mild pain.   Yes [provider]  collagenase (SANTYL) ointment Apply 1 application  topically daily. Wound size 2.5 x 5 01/14/21  Yes Park Liter, DPM  glipiZIDE (GLUCOTROL) 10 MG tablet Take 10 mg by mouth 2 (two) times daily before a meal.   Yes [provider]  metFORMIN (GLUMETZA) 1000 MG (MOD) 24 hr tablet Take 1,000 mg by mouth 2 (two) times daily with a meal.   Yes [provider]  sulfamethoxazole-trimethoprim (BACTRIM DS) 800-160 MG tablet Take 2 tablets by mouth every 12 (twelve) hours for 13 doses. Patient taking differently: Take 2 tablets by mouth See admin instructions. Every 12 hours x 13 doses 01/11/21 01/17/21 Yes Wouk, Wilfred Curtis, MD  cephALEXin (KEFLEX) 500 MG capsule Take 500 mg by mouth See admin instructions. Qid x 7 days Patient not taking: Reported on 01/15/2021 12/25/20   [provider]  naproxen (NAPROSYN) 500 MG tablet Take 1 tablet (500 mg total) by mouth 2 (two) times daily with a meal. Patient not taking: Reported on 01/15/2021 01/06/21   Wallis Bamberg, PA-C    Social History   Socioeconomic History   Marital status: Married    Spouse name: Not on file   Number of children: Not on file   Years of education: Not on file   Highest education level: Not on file  Occupational History   Not on file  Tobacco Use   Smoking status: Never   Smokeless tobacco: Never  Vaping Use   Vaping Use: Never used  Substance and Sexual Activity   Alcohol use: Not Currently   Drug use:  Never   Sexual activity: Not on file  Other Topics Concern   Not on file  Social History Narrative   Not on file   Social Determinants of Health   Financial Resource Strain: Not on file  Food Insecurity: Not on file  Transportation Needs: Not on file  Physical Activity: Not on file  Stress: Not on file  Social Connections: Not on file  Intimate Partner Violence: Not on file     Family History  Problem Relation Age of Onset   Diabetes Mother     ROS: Otherwise negative unless mentioned in HPI  Physical Examination  Vitals:    01/16/21 0607 01/16/21 0853  BP: 139/77 (!) 143/76  Pulse: 81 85  Resp: 16 18  Temp: 98 F (36.7 C) 97.8 F (36.6 C)  SpO2: 98% 97%   There is no height or weight on file to calculate BMI.  General:  WDWN in NAD Gait: Not observed HENT: WNL, normocephalic Pulmonary: normal non-labored breathing, without Rales, rhonchi,  wheezing Cardiac: regular Abdomen:  soft, NT/ND, no masses Skin: without rashes Vascular Exam/Pulses: palpable R DP pulse; unable to palpate R PTA  Extremities: non healing surgical wound with dry gangrene of 1st and 3rd toe with erythema onto the foot Musculoskeletal: no muscle wasting or atrophy  Neurologic: A&O X 3;  No focal weakness or paresthesias are detected; speech is fluent/normal Psychiatric:  The pt has Normal affect. Lymph:  Unremarkable  CBC    Component Value Date/Time   WBC 10.2 01/16/2021 0302   RBC 3.85 (L) 01/16/2021 0302   HGB 9.8 (L) 01/16/2021 0302   HCT 31.5 (L) 01/16/2021 0302   PLT 363 01/16/2021 0302   MCV 81.8 01/16/2021 0302   MCH 25.5 (L) 01/16/2021 0302   MCHC 31.1 01/16/2021 0302   RDW 14.0 01/16/2021 0302   LYMPHSABS 1.5 01/15/2021 1828   MONOABS 0.6 01/15/2021 1828   EOSABS 0.5 01/15/2021 1828   BASOSABS 0.1 01/15/2021 1828    BMET    Component Value Date/Time   NA 135 01/16/2021 0302   K 4.9 01/16/2021 0302   CL 108 01/16/2021 0302   CO2 18 (L) 01/16/2021 0302   GLUCOSE 74 01/16/2021 0302   BUN 16 01/16/2021 0302   CREATININE 1.34 (H) 01/16/2021 0302   CALCIUM 8.1 (L) 01/16/2021 0302   GFRNONAA >60 01/16/2021 0302   GFRAA >60 07/17/2017 1959    COAGS: No results found for: INR, PROTIME   Non-Invasive Vascular Imaging:   RLE arterial duplex and ABI pending  Plain film negative for osteomyelitis  ASSESSMENT/PLAN: This is a 50 y.o. 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  He will likely require further amputation given extensive tissue loss and is at risk for limb loss.  Agree with wound care per Podiatry and IV antibiotics for now -On call surgeon Dr. Chestine Spore will evaluate the patient later today and provide further treatment plans   Emilie Rutter PA-C [facility] [phone]   I have seen and evaluated the patient. I agree with the PA note as documented above.  50 year old male with diabetes that vascular surgery has been consulted for extensive right foot wound.  He stepped on a nail at a construction site and has undergone I&D  by podiatry.  He does have palpable dorsalis pedis pulse bilaterally as well as femoral pulses.  I have reviewed his noninvasive imaging that shows a toe pressure of 0 on the right and a moderate popliteal stenosis.  I think he would benefit from aortogram with lower extremity arteriogram to ensure he is optimized from a vascular surgery standpoint.  There may be nothing amenable to intervention and this is all small vessel disease but we can evaluate with arteriogram.  I discussed this being done tomorrow with my partner Dr. Lenell Antu.  Please keep n.p.o. after midnight.     Cephus Shelling, MD [facility] Office: [phone]

## 2021-01-16 NOTE — Progress Notes (Signed)
Patient arrived to [phone]. AXOX4, able to make needs known. VS stable BP139/77 T98.0 PR81 RR16 98%RA. CBG87.  Discoloration noted to Rgreat toe & 3rd digit, (+)pedal pulse & warm to touch. Given PRN Tylenol for 4/10 R foot pain. Patient was oriented to call bell and surroundings. Wife at bedside. Call bell within reach and will continue to monitor

## 2021-01-17 ENCOUNTER — Encounter (HOSPITAL_COMMUNITY)
Admission: EM | Disposition: A | Discharge status: Home Health | Payer: Self-pay | Source: Ambulatory Visit | Attending: Internal Medicine

## 2021-01-17 ENCOUNTER — Ambulatory Visit (HOSPITAL_COMMUNITY): Admit: 2021-01-17 | Payer: Self-pay | Admitting: Vascular Surgery

## 2021-01-17 DIAGNOSIS — L089 Local infection of the skin and subcutaneous tissue, unspecified: Secondary | ICD-10-CM

## 2021-01-17 DIAGNOSIS — E11628 Type 2 diabetes mellitus with other skin complications: Secondary | ICD-10-CM

## 2021-01-17 DIAGNOSIS — E1169 Type 2 diabetes mellitus with other specified complication: Secondary | ICD-10-CM

## 2021-01-17 DIAGNOSIS — M869 Osteomyelitis, unspecified: Secondary | ICD-10-CM

## 2021-01-17 HISTORY — PX: ABDOMINAL AORTOGRAM W/LOWER EXTREMITY: CATH118223

## 2021-01-17 LAB — CBC
HCT: 29.1 % — ABNORMAL LOW (ref 39.0–52.0)
Hemoglobin: 9.1 g/dL — ABNORMAL LOW (ref 13.0–17.0)
MCH: 25.1 pg — ABNORMAL LOW (ref 26.0–34.0)
MCHC: 31.3 g/dL (ref 30.0–36.0)
MCV: 80.4 fL (ref 80.0–100.0)
Platelets: 365 10*3/uL (ref 150–400)
RBC: 3.62 MIL/uL — ABNORMAL LOW (ref 4.22–5.81)
RDW: 13.9 % (ref 11.5–15.5)
WBC: 10.3 10*3/uL (ref 4.0–10.5)
nRBC: 0 % (ref 0.0–0.2)

## 2021-01-17 LAB — GLUCOSE, CAPILLARY
Glucose-Capillary: 160 mg/dL — ABNORMAL HIGH (ref 70–99)
Glucose-Capillary: 153 mg/dL — ABNORMAL HIGH (ref 70–99)
Glucose-Capillary: 130 mg/dL — ABNORMAL HIGH (ref 70–99)
Glucose-Capillary: 150 mg/dL — ABNORMAL HIGH (ref 70–99)

## 2021-01-17 LAB — IRON AND TIBC
Iron: 37 ug/dL — ABNORMAL LOW (ref 45–182)
Saturation Ratios: 20 % (ref 17.9–39.5)
TIBC: 188 ug/dL — ABNORMAL LOW (ref 250–450)
UIBC: 151 ug/dL

## 2021-01-17 LAB — BASIC METABOLIC PANEL
Anion gap: 5 (ref 5–15)
BUN: 13 mg/dL (ref 6–20)
CO2: 21 mmol/L — ABNORMAL LOW (ref 22–32)
Calcium: 8.2 mg/dL — ABNORMAL LOW (ref 8.9–10.3)
Chloride: 108 mmol/L (ref 98–111)
Creatinine, Ser: 1.14 mg/dL (ref 0.61–1.24)
GFR, Estimated: >60 mL/min (ref >60)
Glucose, Bld: 160 mg/dL — ABNORMAL HIGH (ref 70–99)
Potassium: 4.8 mmol/L (ref 3.5–5.1)
Sodium: 134 mmol/L — ABNORMAL LOW (ref 135–145)

## 2021-01-17 LAB — RETICULOCYTES
Immature Retic Fract: 21.9 % — ABNORMAL HIGH (ref 2.3–15.9)
RBC.: 3.57 MIL/uL — ABNORMAL LOW (ref 4.22–5.81)
Retic Count, Absolute: 55.7 10*3/uL (ref 19.0–186.0)
Retic Ct Pct: 1.6 % (ref 0.4–3.1)

## 2021-01-17 LAB — VITAMIN B12: Vitamin B-12: 407 pg/mL (ref 180–914)

## 2021-01-17 LAB — FERRITIN: Ferritin: 426 ng/mL — ABNORMAL HIGH (ref 24–336)

## 2021-01-17 LAB — FOLATE: Folate: 10.5 ng/mL (ref >5.9)

## 2021-01-17 SURGERY — ABDOMINAL AORTOGRAM W/LOWER EXTREMITY
Anesthesia: LOCAL | Laterality: Bilateral

## 2021-01-17 MED ORDER — LABETALOL HCL 5 MG/ML IV SOLN
10.0000 mg | INTRAVENOUS | Status: DC | PRN
  Administered 2021-01-17: 10 mg via INTRAVENOUS

## 2021-01-17 MED ORDER — MIDAZOLAM HCL 2 MG/2ML IJ SOLN
INTRAMUSCULAR | Status: AC
  Filled 2021-01-17: qty 2

## 2021-01-17 MED ORDER — IODIXANOL 320 MG/ML IV SOLN
INTRAVENOUS | Status: DC | PRN
  Administered 2021-01-17: 97 mL

## 2021-01-17 MED ORDER — MIDAZOLAM HCL 2 MG/2ML IJ SOLN
INTRAMUSCULAR | Status: DC | PRN
  Administered 2021-01-17: 1 mg via INTRAVENOUS

## 2021-01-17 MED ORDER — SODIUM CHLORIDE 0.9% FLUSH: 3.0000 mL | INTRAVENOUS | Status: DC | PRN

## 2021-01-17 MED ORDER — HEPARIN (PORCINE) IN NACL 1000-0.9 UT/500ML-% IV SOLN
INTRAVENOUS | Status: AC
  Filled 2021-01-17: qty 1000

## 2021-01-17 MED ORDER — LIDOCAINE HCL (PF) 1 % IJ SOLN
INTRAMUSCULAR | Status: DC | PRN
  Administered 2021-01-17: 15 mL

## 2021-01-17 MED ORDER — ONDANSETRON HCL 4 MG/2ML IJ SOLN: 4.0000 mg | Freq: Four times a day (QID) | INTRAMUSCULAR | Status: DC | PRN

## 2021-01-17 MED ORDER — LIDOCAINE HCL (PF) 1 % IJ SOLN
INTRAMUSCULAR | Status: AC
  Filled 2021-01-17: qty 30

## 2021-01-17 MED ORDER — HEPARIN (PORCINE) IN NACL 1000-0.9 UT/500ML-% IV SOLN
INTRAVENOUS | Status: DC | PRN
  Administered 2021-01-17 (×2): 500 mL

## 2021-01-17 MED ORDER — FENTANYL CITRATE (PF) 100 MCG/2ML IJ SOLN
INTRAMUSCULAR | Status: DC | PRN
  Administered 2021-01-17: 50 ug via INTRAVENOUS

## 2021-01-17 MED ORDER — SODIUM CHLORIDE 0.9 % IV SOLN: 250.0000 mL | INTRAVENOUS | Status: DC | PRN

## 2021-01-17 MED ORDER — FENTANYL CITRATE (PF) 100 MCG/2ML IJ SOLN
INTRAMUSCULAR | Status: AC
  Filled 2021-01-17: qty 2

## 2021-01-17 MED ORDER — SODIUM CHLORIDE 0.9% FLUSH
3.0000 mL | Freq: Two times a day (BID) | INTRAVENOUS | Status: DC
  Administered 2021-01-18: 3 mL via INTRAVENOUS

## 2021-01-17 MED ORDER — LABETALOL HCL 5 MG/ML IV SOLN
INTRAVENOUS | Status: AC
  Filled 2021-01-17: qty 4

## 2021-01-17 MED ORDER — HYDRALAZINE HCL 20 MG/ML IJ SOLN: 5.0000 mg | INTRAMUSCULAR | Status: DC | PRN

## 2021-01-17 MED ORDER — SODIUM CHLORIDE 0.9 % WEIGHT BASED INFUSION: 1.0000 mL/kg/h | INTRAVENOUS | Status: AC

## 2021-01-17 SURGICAL SUPPLY — 9 items
CATH OMNI FLUSH 5F 65CM (CATHETERS) ×2 IMPLANT
KIT MICROPUNCTURE NIT STIFF (SHEATH) ×2 IMPLANT
KIT PV (KITS) ×2 IMPLANT
SHEATH PINNACLE 5F 10CM (SHEATH) ×2 IMPLANT
SHEATH PROBE COVER 6X72 (BAG) ×2 IMPLANT
SYR MEDRAD MARK V 150ML (SYRINGE) ×2 IMPLANT
TRANSDUCER W/STOPCOCK (MISCELLANEOUS) ×2 IMPLANT
TRAY PV CATH (CUSTOM PROCEDURE TRAY) ×2 IMPLANT
WIRE BENTSON .035X145CM (WIRE) ×2 IMPLANT

## 2021-01-17 NOTE — Progress Notes (Signed)
[facility] [place] PROGRESS NOTE  ASSESSMENT / PLAN: Richard Pittman is a 50 y.o. male with right diabetic foot infection. He has a palpable right DP pulse, he has not healed foot debridement. Duplex suggests popliteal stenosis. 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 Plan angiogram today with possible intervention. All questions answered via translator.  SUBJECTIVE: No complaints. Reviewed plan for OR today.  OBJECTIVE: BP 136/77 (BP Location: Left Arm)   Pulse 79   Temp 98 F (36.7 C) (Oral)   Resp 16   SpO2 99%   Intake/Output Summary (Last 24 hours) at 01/17/2021 1010 Last data filed at 01/17/2021 5643 Gross per 24 hour  Intake 1423.03 ml  Output --  Net 1423.03 ml    Constitutional: well appearing. no acute distress. Cardiac: RRR. Pulmonary: unlabored Abdomen: soft Vascular: unchanged appearance of right foot  CBC Latest Ref Rng & Units 01/17/2021 01/16/2021 01/15/2021  WBC 4.0 - 10.5 K/uL 10.3 10.2 11.0(H)  Hemoglobin 13.0 - 17.0 g/dL 3.2(R) 5.1(O) 10.6(L)  Hematocrit 39.0 - 52.0 % 29.1(L) 31.5(L) 34.0(L)  Platelets 150 - 400 K/uL 365 363 391     CMP Latest Ref Rng & Units 01/17/2021 01/16/2021 01/15/2021  Glucose 70 - 99 mg/dL 841(Y) 74 81  BUN 6 - 20 mg/dL 13 16 18   Creatinine 0.61 - 1.24 mg/dL 6.06) 3.01(S)  Sodium 135 - 145 mmol/L 134(L) 135 135  Potassium 3.5 - 5.1 mmol/L 4.8 4.9 5.2(H)  Chloride 98 - 111 mmol/L 108 108 106  CO2 22 - 32 mmol/L 21(L) 18(L) 20(L)  Calcium 8.9 - 10.3 mg/dL 8.2(L) 8.1(L) 8.6(L)  Total Protein 6.5 - 8.1 g/dL - - -  Total Bilirubin 0.3 - 1.2 mg/dL - - -  Alkaline Phos 38 - 126 U/L - - -  AST 15 - 41 U/L - - -  ALT 0 - 44 U/L - - -    Estimated Creatinine Clearance: 90.2 mL/min (by C-G formula based on SCr of 1.14 mg/dL).  0.10(X. Rande Brunt, MD [facility] Phone Number: [phone] 01/17/2021 10:10 AM

## 2021-01-17 NOTE — Progress Notes (Addendum)
Site area: Left groin a 5 french arterial sheath was removed  Site Prior to Removal:  Level 0  Pressure Applied For 20 MINUTES    Bedrest Beginning at  1145am X 4 hours  Manual:   Yes.    Patient Status During Pull:  stable  Post Pull Groin Site:  Level 0  Post Pull Instructions Given:  Yes.    Post Pull Pulses Present:  Yes.    Dressing Applied:  Yes.    Comments:

## 2021-01-17 NOTE — Progress Notes (Signed)
PROGRESS NOTE    Richard Pittman  IRW:[id number] DOB: 25-Sep-1970 DOA: 01/15/2021 PCP: [name], PA-C   Brief Narrative: 50 year old with past medical history significant for diabetes, foot infection recent amputation of the fourth digit presents with worsening discoloration of the right foot.  Right foot infection  started after patient stepped on a nail  at a construction site.  He developed an abscess at the trauma site and subsequently underwent incision and drainage by Dr. [name] on 01/08/2021.  Subsequently 48 hours later he underwent fourth toe amputation.  He went for follow-up to his podiatrist and was then refer for admission for further evaluation by vascular surgery, due to right foot changes consistent with vascular insufficiency.  Assessment & Plan:   Principal Problem:   Diabetic foot infection (HCC) Active Problems:   Foot infection   Uncontrolled type 2 diabetes mellitus with hyperglycemia (HCC)   AKI (acute kidney injury) (HCC)   Microcytic anemia   Hyperkalemia   1-Right foot wound, diabetic foot infection: -Patient wound infection started after he stepped on a nail at construction site.  -Recent hospitalization 11/23 for incision and drainage and subsequently fourth toe amputation. -He was referred for admission 11/30 for further evaluation of unhealing wound and changes consistent with vascular insufficiency. -Plan to continue with IV vancomycin and Zosyn  -Vascular surgery consulted. ABI ordered.  -Plan for arteriogram 12/2: No Hemodynamically significant arterial diseases.  -Follow Blood culture. No growth to date.  -Culture from foot: streptococcus anginosis, staph.  Betadine wet to dry.   2-AKI, hyperkalemia: Patient received IV fluids. Creatinine up to 1.4 on admission baseline of 1 Cr down to 1.3--1.1 Hyperkalemia resolved.   Diabetes: Continue with SSI. A1c; 8.1 He has been working on diet.  CBG well controlled.    Anemia: anemia panel. Iron deficiency.  Needs iron at discharge     Estimated body mass index is 35.36 kg/m as calculated from the following:   Height as of 01/10/21: 5\' 8"  (1.727 m).   Weight as of 01/11/21: 105.5 kg.   DVT prophylaxis: SCD Code Status: Full code Family Communication: Care discussed with patient and wife Disposition Plan:  Status is: inpatient.     Consultants:  Vascular Podiatry   Procedures:  ABI  Antimicrobials:  Vancomycin Zosyn   Subjective: He is alert, Saw him post arteriogram.  Denies pain   Objective: Vitals:   01/17/21 1345 01/17/21 1415 01/17/21 1445 01/17/21 1515  BP: (!) 181/79 (!) 170/84 (!) 167/69 (!) 166/71  Pulse: 90 80 82 80  Resp: 14 14 15 16   Temp:      TempSrc:      SpO2: 96% 97% 98% 98%    Intake/Output Summary (Last 24 hours) at 01/17/2021 1539 Last data filed at 01/17/2021 1300 Gross per 24 hour  Intake 1303.03 ml  Output 600 ml  Net 703.03 ml    There were no vitals filed for this visit.  Examination:  General exam: NAD  Respiratory system: CTA Cardiovascular system: S 1, S 2 RRR Gastrointestinal system: BS present, soft, nt Central nervous system: Non focal.  Extremities: Right foot with dressing.   Data Reviewed: I have personally reviewed following labs and imaging studies  CBC: Recent Labs  Lab 01/11/21 0529 01/15/21 1828 01/16/21 0302 01/17/21 0214  WBC 11.8* 11.0* 10.2 10.3  NEUTROABS  --  8.1*  --   --   HGB 9.5* 10.6* 9.8* 9.1*  HCT 29.1* 34.0* 31.5* 29.1*  MCV 80.2 81.7 81.8 80.4  PLT 295 391 363 365    Basic Metabolic Panel: Recent Labs  Lab 01/11/21 0529 01/15/21 1828 01/16/21 0302 01/17/21 0214  NA 133* 135 135 134*  K 4.1 5.2* 4.9 4.8  CL 105 106 108 108  CO2 20* 20* 18* 21*  GLUCOSE 156* 81 74 160*  BUN 18 18 16 13   CREATININE 0.91 1.48* 1.34* 1.14  CALCIUM 8.0* 8.6* 8.1* 8.2*    GFR: Estimated Creatinine Clearance: 90.2 mL/min (by C-G formula based on SCr of  1.14 mg/dL). Liver Function Tests: No results for input(s): AST, ALT, ALKPHOS, BILITOT, PROT, ALBUMIN in the last 168 hours. No results for input(s): LIPASE, AMYLASE in the last 168 hours. No results for input(s): AMMONIA in the last 168 hours. Coagulation Profile: No results for input(s): INR, PROTIME in the last 168 hours. Cardiac Enzymes: No results for input(s): CKTOTAL, CKMB, CKMBINDEX, TROPONINI in the last 168 hours. BNP (last 3 results) No results for input(s): PROBNP in the last 8760 hours. HbA1C: No results for input(s): HGBA1C in the last 72 hours. CBG: Recent Labs  Lab 01/16/21 1220 01/16/21 1744 01/16/21 2123 01/17/21 0800 01/17/21 1127  GLUCAP 104* 142* 154* 160* 153*    Lipid Profile: No results for input(s): CHOL, HDL, LDLCALC, TRIG, CHOLHDL, LDLDIRECT in the last 72 hours. Thyroid Function Tests: No results for input(s): TSH, T4TOTAL, FREET4, T3FREE, THYROIDAB in the last 72 hours. Anemia Panel: Recent Labs    01/17/21 0214  VITAMINB12 407  FOLATE 10.5  FERRITIN 426*  TIBC 188*  IRON 37*  RETICCTPCT 1.6   Sepsis Labs: Recent Labs  Lab 01/15/21 1828 01/16/21 0305  LATICACIDVEN 0.9 0.6     Recent Results (from the past 240 hour(s))  Resp Panel by RT-PCR (Flu A&B, Covid) Nasopharyngeal Swab     Status: None   Collection Time: 01/07/21  5:50 PM   Specimen: Nasopharyngeal Swab; Nasopharyngeal(NP) swabs in vial transport medium  Result Value Ref Range Status   SARS Coronavirus 2 by RT PCR NEGATIVE NEGATIVE Final    Comment: (NOTE) SARS-CoV-2 target nucleic acids are NOT DETECTED.  The SARS-CoV-2 RNA is generally detectable in upper respiratory specimens during the acute phase of infection. The lowest concentration of SARS-CoV-2 viral copies this assay can detect is 138 copies/mL. A negative result does not preclude SARS-Cov-2 infection and should not be used as the sole basis for treatment or other patient management decisions. A negative  result may occur with  improper specimen collection/handling, submission of specimen other than nasopharyngeal swab, presence of viral mutation(s) within the areas targeted by this assay, and inadequate number of viral copies(<138 copies/mL). A negative result must be combined with clinical observations, patient history, and epidemiological information. The expected result is Negative.  Fact Sheet for Patients:  01/09/21  Fact Sheet for Healthcare Providers:  BloggerCourse.com  This test is no t yet approved or cleared by the [place] FDA and  has been authorized for detection and/or diagnosis of SARS-CoV-2 by FDA under an Emergency Use Authorization (EUA). This EUA will remain  in effect (meaning this test can be used) for the duration of the COVID-19 declaration under Section 564(b)(1) of the Act, 21 U.S.C.section 360bbb-3(b)(1), unless the authorization is terminated  or revoked sooner.       Influenza A by PCR NEGATIVE NEGATIVE Final   Influenza B by PCR NEGATIVE NEGATIVE Final    Comment: (NOTE) The Xpert Xpress SARS-CoV-2/FLU/RSV plus assay is intended as an aid in the diagnosis of influenza from  Nasopharyngeal swab specimens and should not be used as a sole basis for treatment. Nasal washings and aspirates are unacceptable for Xpert Xpress SARS-CoV-2/FLU/RSV testing.  Fact Sheet for Patients: BloggerCourse.com  Fact Sheet for Healthcare Providers: SeriousBroker.it  This test is not yet approved or cleared by the [place] FDA and has been authorized for detection and/or diagnosis of SARS-CoV-2 by FDA under an Emergency Use Authorization (EUA). This EUA will remain in effect (meaning this test can be used) for the duration of the COVID-19 declaration under Section 564(b)(1) of the Act, 21 U.S.C. section 360bbb-3(b)(1), unless the authorization is  terminated or revoked.  Performed at [facility], [address], [place], [place] [address]   Blood culture (routine x 2)     Status: None   Collection Time: 01/07/21  6:30 PM   Specimen: Left Antecubital; Blood  Result Value Ref Range Status   Specimen Description   Final    LEFT ANTECUBITAL Performed at [facility], [address], [place], [place] [address]    Special Requests   Final    BOTTLES DRAWN AEROBIC AND ANAEROBIC Blood Culture adequate volume Performed at [facility], [address], [place], [place] [address]    Culture   Final    NO GROWTH 5 DAYS Performed at [facility], [address], [place], [place] [address]    Report Status 01/12/2021 FINAL  Final  Blood culture (routine x 2)     Status: None   Collection Time: 01/07/21  6:34 PM   Specimen: Right Antecubital; Blood  Result Value Ref Range Status   Specimen Description   Final    RIGHT ANTECUBITAL Performed at [facility], [address], [place], [place] [address]    Special Requests   Final    BOTTLES DRAWN AEROBIC AND ANAEROBIC Blood Culture adequate volume Performed at [facility], [address], [place], [place] [address]    Culture   Final    NO GROWTH 5 DAYS Performed at [facility], [address] [place], [place] [address]    Report Status 01/12/2021 FINAL  Final  Aerobic/Anaerobic Culture w Gram Stain (surgical/deep wound)     Status: None   Collection Time: 01/08/21  1:27 PM   Specimen: PATH Soft tissue  Result Value Ref Range Status   Specimen Description   Final    TISSUE RIGHT FOOT Performed at [facility], [address] [place], [place] [address]    Special Requests   Final    NONE Performed at [facility], [address], [place], [place] [address]    Gram Stain   Final    FEW SQUAMOUS EPITHELIAL CELLS PRESENT FEW WBC PRESENT,  PREDOMINANTLY MONONUCLEAR MODERATE GRAM POSITIVE COCCI FEW GRAM NEGATIVE RODS RARE GRAM POSITIVE RODS    Culture   Final    MODERATE STREPTOCOCCUS AGALACTIAE TESTING AGAINST S. AGALACTIAE NOT ROUTINELY PERFORMED DUE TO PREDICTABILITY OF AMP/PEN/VAN SUSCEPTIBILITY. FEW STAPHYLOCOCCUS AUREUS MODERATE STREPTOCOCCUS ANGINOSIS ABUNDANT PREVOTELLA BIVIA BETA LACTAMASE POSITIVE Performed at [facility], [address], [place], [place] [address]    Report Status 01/12/2021 FINAL  Final   Organism ID, Bacteria STAPHYLOCOCCUS AUREUS  Final   Organism ID, Bacteria STREPTOCOCCUS ANGINOSIS  Final      Susceptibility   Staphylococcus aureus - MIC*    CIPROFLOXACIN <=0.5 SENSITIVE Sensitive     ERYTHROMYCIN <=0.25 SENSITIVE Sensitive  GENTAMICIN <=0.5 SENSITIVE Sensitive     OXACILLIN 0.5 SENSITIVE Sensitive     TETRACYCLINE <=1 SENSITIVE Sensitive     VANCOMYCIN 1 SENSITIVE Sensitive     TRIMETH/SULFA <=10 SENSITIVE Sensitive     CLINDAMYCIN <=0.25 SENSITIVE Sensitive     RIFAMPIN <=0.5 SENSITIVE Sensitive     Inducible Clindamycin NEGATIVE Sensitive     * FEW STAPHYLOCOCCUS AUREUS   Streptococcus anginosis - MIC*    PENICILLIN <=0.06 SENSITIVE Sensitive     CEFTRIAXONE <=0.12 SENSITIVE Sensitive     ERYTHROMYCIN <=0.12 SENSITIVE Sensitive     LEVOFLOXACIN 0.5 SENSITIVE Sensitive     VANCOMYCIN 0.25 SENSITIVE Sensitive     * MODERATE STREPTOCOCCUS ANGINOSIS  Culture, blood (single)     Status: None (Preliminary result)   Collection Time: 01/15/21  6:28 PM   Specimen: BLOOD  Result Value Ref Range Status   Specimen Description BLOOD SITE NOT SPECIFIED  Final   Special Requests   Final    BOTTLES DRAWN AEROBIC ONLY Blood Culture adequate volume   Culture   Final    NO GROWTH 2 DAYS Performed at [facility], [address], [place], [place] [address]    Report Status PENDING  Incomplete  Resp Panel by RT-PCR (Flu A&B, Covid) Nasopharyngeal Swab     Status: None    Collection Time: 01/15/21  6:28 PM   Specimen: Nasopharyngeal Swab; Nasopharyngeal(NP) swabs in vial transport medium  Result Value Ref Range Status   SARS Coronavirus 2 by RT PCR NEGATIVE NEGATIVE Final    Comment: (NOTE) SARS-CoV-2 target nucleic acids are NOT DETECTED.  The SARS-CoV-2 RNA is generally detectable in upper respiratory specimens during the acute phase of infection. The lowest concentration of SARS-CoV-2 viral copies this assay can detect is 138 copies/mL. A negative result does not preclude SARS-Cov-2 infection and should not be used as the sole basis for treatment or other patient management decisions. A negative result may occur with  improper specimen collection/handling, submission of specimen other than nasopharyngeal swab, presence of viral mutation(s) within the areas targeted by this assay, and inadequate number of viral copies(<138 copies/mL). A negative result must be combined with clinical observations, patient history, and epidemiological information. The expected result is Negative.  Fact Sheet for Patients:  BloggerCourse.com  Fact Sheet for Healthcare Providers:  SeriousBroker.it  This test is no t yet approved or cleared by the [place] FDA and  has been authorized for detection and/or diagnosis of SARS-CoV-2 by FDA under an Emergency Use Authorization (EUA). This EUA will remain  in effect (meaning this test can be used) for the duration of the COVID-19 declaration under Section 564(b)(1) of the Act, 21 U.S.C.section 360bbb-3(b)(1), unless the authorization is terminated  or revoked sooner.       Influenza A by PCR NEGATIVE NEGATIVE Final   Influenza B by PCR NEGATIVE NEGATIVE Final    Comment: (NOTE) The Xpert Xpress SARS-CoV-2/FLU/RSV plus assay is intended as an aid in the diagnosis of influenza from Nasopharyngeal swab specimens and should not be used as a sole basis for treatment.  Nasal washings and aspirates are unacceptable for Xpert Xpress SARS-CoV-2/FLU/RSV testing.  Fact Sheet for Patients: BloggerCourse.com  Fact Sheet for Healthcare Providers: SeriousBroker.it  This test is not yet approved or cleared by the [place] FDA and has been authorized for detection and/or diagnosis of SARS-CoV-2 by FDA under an Emergency Use Authorization (EUA). This EUA will remain in effect (meaning this test can be  used) for the duration of the COVID-19 declaration under Section 564(b)(1) of the Act, 21 U.S.C. section 360bbb-3(b)(1), unless the authorization is terminated or revoked.  Performed at [facility], [address], [place], [place] [address]           Radiology Studies: DG Foot Complete Right  Result Date: 01/15/2021 CLINICAL DATA:  Foot infection redness to the third and second toe EXAM: RIGHT FOOT COMPLETE - 3+ VIEW COMPARISON:  01/10/2021 FINDINGS: Interval fourth digit amputation at the level of the MTP. No fracture seen. No periostitis or osseous destructive change. Continued considerable soft tissue swelling over the distal foot. IMPRESSION: 1. Interval amputation of fourth digit at the level of the MTP joint. 2. Continued soft tissue swelling without acute osseous abnormality Electronically Signed   By: Jasmine Pang M.D.   On: 01/15/2021 20:20   VAS [place] ABI WITH/WO TBI  Result Date: 01/16/2021  LOWER EXTREMITY DOPPLER STUDY Patient Name:  Richard Pittman [facility]  Date of Exam:   01/16/2021 Medical Rec #: [id number]                Accession #:    [id number] Date of Birth: 20-Jul-1970                Patient Gender: M Patient Age:   33 years Exam Location:  [facility] Procedure:      VAS [place] ABI WITH/WO TBI Referring Phys: Lyn Hollingshead MELVIN --------------------------------------------------------------------------------  Indications: Ulceration, and nonhealing surgical wound of right foot.  High Risk Factors: Diabetes.  Comparison Study: No prior study Performing Technologist: Sherren Kerns RVS  Examination Guidelines: A complete evaluation includes at minimum, Doppler waveform signals and systolic blood pressure reading at the level of bilateral brachial, anterior tibial, and posterior tibial arteries, when vessel segments are accessible. Bilateral testing is considered an integral part of a complete examination. Photoelectric Plethysmograph (PPG) waveforms and toe systolic pressure readings are included as required and additional duplex testing as needed. Limited examinations for reoccurring indications may be performed as noted.  ABI Findings: +---------+------------------+-----+---------+--------+ Right    Rt Pressure (mmHg)IndexWaveform Comment  +---------+------------------+-----+---------+--------+ Brachial 141                    triphasic         +---------+------------------+-----+---------+--------+ PTA      170               1.14 biphasic          +---------+------------------+-----+---------+--------+ DP       166               1.11 biphasic          +---------+------------------+-----+---------+--------+ Great Toe0                 0.00                   +---------+------------------+-----+---------+--------+ +---------+------------------+-----+---------+-------+ Left     Lt Pressure (mmHg)IndexWaveform Comment +---------+------------------+-----+---------+-------+ Brachial 149                    triphasic        +---------+------------------+-----+---------+-------+ PTA      170               1.14 biphasic         +---------+------------------+-----+---------+-------+ DP       168               1.13 biphasic         +---------+------------------+-----+---------+-------+  Great Toe135               0.91                  +---------+------------------+-----+---------+-------+  +-------+-----------+-----------+------------+------------+ ABI/TBIToday's ABIToday's TBIPrevious ABIPrevious TBI +-------+-----------+-----------+------------+------------+ Right  1.14       0                                   +-------+-----------+-----------+------------+------------+ Left   1.14       0.91                                +-------+-----------+-----------+------------+------------+  Summary: Right: Resting right ankle-brachial index is within normal range. No evidence of significant right lower extremity arterial disease. The right toe-brachial index is abnormal. Left: Resting left ankle-brachial index is within normal range. No evidence of significant left lower extremity arterial disease. The left toe-brachial index is normal.  *See table(s) above for measurements and observations.  Electronically signed by Sherald Hess MD on 01/16/2021 at 5:25:30 PM.    Final    VAS [place] LOWER EXTREMITY ARTERIAL DUPLEX  Result Date: 01/16/2021 LOWER EXTREMITY ARTERIAL DUPLEX STUDY Patient Name:  Richard Pittman [facility]  Date of Exam:   01/16/2021 Medical Rec #: [id number]                Accession #:    [id number] Date of Birth: 04/13/1970                Patient Gender: M Patient Age:   89 years Exam Location:  [facility] Procedure:      VAS [place] LOWER EXTREMITY ARTERIAL DUPLEX Referring Phys: Emilie Rutter --------------------------------------------------------------------------------  Indications: Ulceration, and nonhealing surgical wound of right foot. High Risk Factors: Diabetes.  Current ABI: bilateral 1.14, absent right toe waveform/pressure, normal left TBI Comparison Study: no prior study on file Performing Technologist: Sherren Kerns RVS  Examination Guidelines: A complete evaluation includes B-mode imaging, spectral Doppler, color Doppler, and power Doppler as needed of all accessible portions of each vessel. Bilateral testing is considered an integral part of a complete  examination. Limited examinations for reoccurring indications may be performed as noted.  +-----------+--------+-----+---------------+-----------+--------+ RIGHT      PSV cm/sRatioStenosis       Waveform   Comments +-----------+--------+-----+---------------+-----------+--------+ CFA Prox   160          30-49% stenosismultiphasic         +-----------+--------+-----+---------------+-----------+--------+ DFA        69                          multiphasic         +-----------+--------+-----+---------------+-----------+--------+ SFA Prox   119                         multiphasic         +-----------+--------+-----+---------------+-----------+--------+ SFA Mid    127                         multiphasic         +-----------+--------+-----+---------------+-----------+--------+ SFA Distal 182          30-49% stenosismultiphasic         +-----------+--------+-----+---------------+-----------+--------+ POP Prox   181  30-49% stenosismultiphasic         +-----------+--------+-----+---------------+-----------+--------+ POP Distal 230          50-74% stenosismultiphasic         +-----------+--------+-----+---------------+-----------+--------+ TP Trunk   128                         multiphasic         +-----------+--------+-----+---------------+-----------+--------+ ATA Prox   75                          multiphasic         +-----------+--------+-----+---------------+-----------+--------+ ATA Mid    196          30-49% stenosismultiphasic         +-----------+--------+-----+---------------+-----------+--------+ ATA Distal 59                          multiphasic         +-----------+--------+-----+---------------+-----------+--------+ PTA Prox   122                         multiphasic         +-----------+--------+-----+---------------+-----------+--------+ PTA Mid    138                         multiphasic          +-----------+--------+-----+---------------+-----------+--------+ PTA Distal 125                         multiphasic         +-----------+--------+-----+---------------+-----------+--------+ PERO Prox  77                          multiphasic         +-----------+--------+-----+---------------+-----------+--------+ PERO Mid   70                          multiphasic         +-----------+--------+-----+---------------+-----------+--------+ PERO Distal70                          multiphasic         +-----------+--------+-----+---------------+-----------+--------+  Summary: Right: 30-49% stenosis noted in the common femoral artery. 30-49% stenosis noted in the superficial femoral artery. 50-74% stenosis noted in the popliteal artery. 30-49% stenosis noted in the anterior tibial artery. Heterogenous plaque noted throughout the right lower extremity arterial system. Waveforms are multiphasic.  See table(s) above for measurements and observations. Electronically signed by Sherald Hess MD on 01/16/2021 at 5:32:37 PM.    Final         Scheduled Meds:  feeding supplement (GLUCERNA SHAKE)  237 mL Oral Q24H   insulin aspart  0-5 Units Subcutaneous QHS   insulin aspart  0-9 Units Subcutaneous TID WC   polyethylene glycol  17 g Oral Daily   sodium chloride flush  3 mL Intravenous Q12H   Continuous Infusions:  sodium chloride 75 mL/hr at 01/17/21 1021   sodium chloride 1 mL/kg/hr (01/17/21 1150)   piperacillin-tazobactam (ZOSYN)  IV 3.375 g (01/17/21 1511)   vancomycin Stopped (01/17/21 1513)     LOS: 1 day    Time spent: 35 minutes    Raniya Golembeski A Alaila Pillard, MD [facility]   If  7PM-7AM, please contact night-coverage [email or web address]  01/17/2021, 3:39 PM

## 2021-01-17 NOTE — Op Note (Signed)
DATE OF SERVICE: 01/17/2021  PATIENT:  Richard Pittman  50 y.o. male  PRE-OPERATIVE DIAGNOSIS:  right diabetic foot infection  POST-OPERATIVE DIAGNOSIS:  Same  PROCEDURE:   1) US guided left common femoral artery access 2) Aortogram 3) Bilateral lower extremity runoff angiogram (81mL total contrast) 4) Conscious sedation (22 minutes)  SURGEON:  Rande Brunt. Lenell Antu, MD  ASSISTANT: none  ANESTHESIA:   local and IV sedation  ESTIMATED BLOOD LOSS: minimal  LOCAL MEDICATIONS USED:  LIDOCAINE   COUNTS: confirmed correct.  PATIENT DISPOSITION:  PACU - hemodynamically stable.   Delay start of Pharmacological VTE agent (>24hrs) due to surgical blood loss or risk of bleeding: no  INDICATION FOR PROCEDURE: Richard Pittman is a 50 y.o. male with diabetic foot infection to the right foot. Preoperative evaluation suggested popliteal artery stenosis. After careful discussion of risks, benefits, and alternatives the patient was offered angiography. The patient understood and wished to proceed.  OPERATIVE FINDINGS:  Terminal aorta and iliac arteries: Widely patent without flow limiting stenosis  Right lower extremity: Common femoral artery: Widely patent without flow limiting stenosis  Profunda femoris artery: Widely patent without flow limiting stenosis  Superficial femoral artery: Widely patent without flow limiting stenosis Popliteal artery: Widely patent without flow limiting stenosis Anterior tibial artery: Widely patent without flow limiting stenosis Tibioperoneal trunk: Widely patent without flow limiting stenosis Peroneal artery: Widely patent without flow limiting stenosis Posterior tibial artery: Widely patent without flow limiting stenosis Pedal circulation: fills well via tibials  Left lower extremity: Common femoral artery: Widely patent without flow limiting stenosis  Profunda femoris artery: Widely patent without flow limiting stenosis  Superficial femoral  artery: Widely patent without flow limiting stenosis Popliteal artery: Widely patent without flow limiting stenosis Anterior tibial artery: Widely patent without flow limiting stenosis Tibioperoneal trunk: Widely patent without flow limiting stenosis Peroneal artery: Widely patent without flow limiting stenosis Posterior tibial artery: Widely patent without flow limiting stenosis Pedal circulation: fills well via tibials  DESCRIPTION OF PROCEDURE: After identification of the patient in the pre-operative holding area, the patient was transferred to the operating room. The patient was positioned supine on the operating room table. Anesthesia was induced. The groins was prepped and draped in standard fashion. A surgical pause was performed confirming correct patient, procedure, and operative location.  The left groin was anesthetized with subcutaneous injection of 1% lidocaine. Using ultrasound guidance, the left common femoral artery was accessed with micropuncture technique. Fluoroscopy was used to confirm cannulation over the femoral head. The 738F sheath was upsized to 38F.   A Benson wire was advanced into the distal aorta. Over the wire an omni flush catheter was advanced to the level of L2. Aortogram was performed - see above for details. Bilateral lower extremity runoff angiography was performed - see above for details.  The sheath was left in place to be removed in the PACU.   Conscious sedation was administered with the use of IV fentanyl and midazolam under continuous physician and nurse monitoring.  Heart rate, blood pressure, and oxygen saturation were continuously monitored.  Total sedation time was 22 minutes  Upon completion of the case instrument and sharps counts were confirmed correct. The patient was transferred to the PACU in good condition. I was present for all portions of the procedure.  PLAN: No hemodynamically significant arterial disease. Continue local care to the foot. Call  for questions.   Rande Brunt. Lenell Antu, MD [facility] Phone Number: [phone]  01/17/2021 10:58 AM

## 2021-01-17 NOTE — Progress Notes (Signed)
Pt admitted to [facility] from [facility] cath lab.  Pt placed on telemetry and CCMD notified.  Pt is A&O X4 and neuro intact.  Vitals taken and within normal range.  Groin site is level 0 with no signs of hematoma or bleeding.  Pt is currently comfortable and not in pain.  Call light within reach.

## 2021-01-17 NOTE — Consult Note (Signed)
  Subjective:  Patient ID: Richard Pittman, male    DOB: May 02, 1970,  MRN: [id number]  A 50 y.o. male with past medical history significant of foot infection, hyponatremia, anemia, diabetes presents with worsening discoloration of the digits.  Patient states he is doing well.  He is here in the Cath Lab today he had his angiogram done which showed normal flow to the right foot.  Vascular recommendation was continue local wound care. Objective:   Vitals:   01/17/21 1345 01/17/21 1415  BP: (!) 181/79 (!) 170/84  Pulse: 90 80  Resp: 14 14  Temp:    SpO2: 96% 97%   General AA&O x3. Normal mood and affect.  Vascular Dorsalis pedis palpable 2 out of 4 bilaterally.  Posterior tibial pulse diminished bilaterally Diminished cap refill to all the remaining digits on the right foot pedal hair not present.  Neurologic Decreased sensation grossly intact.  Dermatologic Previous surgical wound noted to the plantar aspect of the right foot.  No purulent drainage was expressed.  No cellulitis noted.  Discoloration noted to all digits with beginning signs of gangrene.  No malodor present.  At this time it is actively demarcating.  No wet gangrene noted  Orthopedic: History of toe amputation fourth digit.    Assessment & Plan:  Patient was evaluated and treated and all questions answered.  Right plantar soft tissue ulceration with concerning for gangrene of the remaining digits -All questions and concerns were discussed with the patient in extensive detail. -Vascular angiogram was within normal limits.  The recommendation was to continue local wound care. -No acute OR plans from podiatric standpoint at this time. -We will follow monitor demarcation in an outpatient setting.  At this time no podiatric intervention needed. -Partial weightbearing to the heel -Continue weight Betadine wet-to-dry dressing -He will follow-up with Dr. [name] in 1 week.  He will be called to schedule an appointment. -He  is okay to be discharged from our standpoint.  Candelaria Stagers, DPM  Accessible via secure chat for questions or concerns.

## 2021-01-18 LAB — BASIC METABOLIC PANEL
Anion gap: 10 (ref 5–15)
BUN: 9 mg/dL (ref 6–20)
CO2: 21 mmol/L — ABNORMAL LOW (ref 22–32)
Calcium: 8.7 mg/dL — ABNORMAL LOW (ref 8.9–10.3)
Chloride: 107 mmol/L (ref 98–111)
Creatinine, Ser: 0.98 mg/dL (ref 0.61–1.24)
GFR, Estimated: >60 mL/min (ref >60)
Glucose, Bld: 127 mg/dL — ABNORMAL HIGH (ref 70–99)
Potassium: 4.8 mmol/L (ref 3.5–5.1)
Sodium: 138 mmol/L (ref 135–145)

## 2021-01-18 LAB — CBC
HCT: 30.6 % — ABNORMAL LOW (ref 39.0–52.0)
Hemoglobin: 9.8 g/dL — ABNORMAL LOW (ref 13.0–17.0)
MCH: 25.5 pg — ABNORMAL LOW (ref 26.0–34.0)
MCHC: 32 g/dL (ref 30.0–36.0)
MCV: 79.5 fL — ABNORMAL LOW (ref 80.0–100.0)
Platelets: 391 10*3/uL (ref 150–400)
RBC: 3.85 MIL/uL — ABNORMAL LOW (ref 4.22–5.81)
RDW: 13.9 % (ref 11.5–15.5)
WBC: 9.2 10*3/uL (ref 4.0–10.5)
nRBC: 0 % (ref 0.0–0.2)

## 2021-01-18 LAB — GLUCOSE, CAPILLARY
Glucose-Capillary: 125 mg/dL — ABNORMAL HIGH (ref 70–99)
Glucose-Capillary: 191 mg/dL — ABNORMAL HIGH (ref 70–99)

## 2021-01-18 MED ORDER — DOXYCYCLINE HYCLATE 100 MG PO TABS
100.0000 mg | ORAL_TABLET | Freq: Two times a day (BID) | ORAL | 0 refills | Status: AC
Start: 2021-01-18 — End: 2021-02-01

## 2021-01-18 MED ORDER — PENICILLIN V POTASSIUM 250 MG PO TABS
500.0000 mg | ORAL_TABLET | Freq: Four times a day (QID) | ORAL | Status: DC
  Administered 2021-01-18: 500 mg via ORAL
  Filled 2021-01-18 (×4): qty 2

## 2021-01-18 MED ORDER — PENICILLIN V POTASSIUM 500 MG PO TABS: 500.0000 mg | ORAL_TABLET | Freq: Four times a day (QID) | ORAL | 0 refills | Status: AC

## 2021-01-18 MED ORDER — DOXYCYCLINE HYCLATE 100 MG PO TABS
100.0000 mg | ORAL_TABLET | Freq: Two times a day (BID) | ORAL | Status: DC
  Administered 2021-01-18: 100 mg via ORAL
  Filled 2021-01-18: qty 1

## 2021-01-18 MED ORDER — FERROUS SULFATE 325 (65 FE) MG PO TABS: 325.0000 mg | ORAL_TABLET | Freq: Every day | ORAL | 3 refills | Status: AC

## 2021-01-18 NOTE — Evaluation (Addendum)
Physical Therapy Evaluation Patient Details Name: Jennifer Holland MRN: [id number] DOB: February 13, 1971 Today's Date: 01/18/2021  History of Present Illness  The pt is a 50 yo male presenting 11/30  with R toe pain, discoloration, and oozing wound. 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 Pt now s/p RLE arteriogram which showed sufficient vascular flow.  PMH includes: DM II.   Clinical Impression  Pt in bed upon arrival of PT, agreeable to evaluation at this time. Prior to original injury, the pt was fully independent with all mobility and ADLs, working Holiday representative. Since last hospitalization and amputation he has been mobilizing with RW and maintaining NWB LLE with no falls or issues. The pt was able to demo good independence with bed mobility as well as ability to complete sit-stand transfers with RW and supervision only for safety, he also maintains NWB LLE during gait in the room, but demos good stability with this movement. The pt was able to describe safe navigation of stairs and reports no issues with mobility at home since last d/c. No new mobility issues at this time, is safe to d/c home with family with no new DME. No further acute PT needs.        Recommendations for follow up therapy are one component of a multi-disciplinary discharge planning process, led by the attending physician.  Recommendations may be updated based on patient status, additional functional criteria and insurance authorization.  Follow Up Recommendations No PT follow up    Assistance Recommended at Discharge Intermittent Supervision/Assistance  Functional Status Assessment Patient has had a recent decline in their functional status and demonstrates the ability to make significant improvements in function in a reasonable and predictable amount of time.  Equipment Recommendations  None recommended by PT (pt has needed RW)     Recommendations for Other Services       Precautions / Restrictions Precautions Precautions: Fall Restrictions Weight Bearing Restrictions: Yes RLE Weight Bearing: Partial weight bearing RLE Partial Weight Bearing Percentage or Pounds: partial wt bearing through heel, pt prefers to maintain NWB      Mobility  Bed Mobility Overal bed mobility: Independent                  Transfers Overall transfer level: Needs assistance   Transfers: Sit to/from Stand             General transfer comment: minG for safety, able to demo good power up to standing without assist    Ambulation/Gait Ambulation/Gait assistance: Min guard Gait Distance (Feet): 20 Feet Assistive device: Rolling walker (2 wheels) Gait Pattern/deviations: Step-to pattern Gait velocity: decreased     General Gait Details: hop-to pattern with LLE NWB for pt comfort  Stairs Stairs:  (verbally discussed, pt with good safety awareness)             Balance Overall balance assessment: Needs assistance Sitting-balance support: No upper extremity supported;Feet supported Sitting balance-Leahy Scale: Normal     Standing balance support: Bilateral upper extremity supported;During functional activity Standing balance-Leahy Scale: Fair Standing balance comment: BUE for  gait due to pain in LLE and pt maintaining NWB                             Pertinent Vitals/Pain Pain Assessment: No/denies pain Pain Intervention(s): Monitored during session    Home Living Family/patient expects to be discharged to::  Private residence Living Arrangements: Spouse/significant other;Children Available Help at Discharge: Family Type of Home: House Home Access: Stairs to enter Entrance Stairs-Rails: None Entrance Stairs-Number of Steps: 1   Home Layout: Multi-level;Able to live on main level with bedroom/bathroom Home Equipment: Shower seat;Rolling Walker (2 wheels)      Prior Function Prior Level  of Function : Independent/Modified Independent             Mobility Comments: works as Corporate investment banker. had nail go through his boot 3 weeks ago and has had fevers and infection in his R foot ever since. ADLs Comments: independent     Hand Dominance   Dominant Hand: Right    Extremity/Trunk Assessment   Upper Extremity Assessment Upper Extremity Assessment: Overall WFL for tasks assessed    Lower Extremity Assessment Lower Extremity Assessment: RLE deficits/detail RLE Deficits / Details: grossly 5/5 and good movement at ankle. reports good sensation in R  leg and foot. RLE: Unable to fully assess due to pain RLE Sensation: WNL RLE Coordination: WNL    Cervical / Trunk Assessment Cervical / Trunk Assessment: Normal  Communication   Communication: No difficulties;Interpreter utilized  Cognition Arousal/Alertness: Awake/alert Behavior During Therapy: WFL for tasks assessed/performed Overall Cognitive Status: Within Functional Limits for tasks assessed                                 General Comments: pt following all instructions and answering all questions appropriately        General Comments General comments (skin integrity, edema, etc.): VSS on RA    Exercises     Assessment/Plan    PT Assessment Patient does not need any further PT services  PT Problem List         PT Treatment Interventions      PT Goals (Current goals can be found in the Care Plan section)  Acute Rehab PT Goals Patient Stated Goal: I want to be able to get better and get back to work PT Goal Formulation: All assessment and education complete, DC therapy     AM-PAC PT "6 Clicks" Mobility  Outcome Measure Help needed turning from your back to your side while in a flat bed without using bedrails?: None Help needed moving from lying on your back to sitting on the side of a flat bed without using bedrails?: None Help needed moving to and from a bed to a chair  (including a wheelchair)?: A Little Help needed standing up from a chair using your arms (e.g., wheelchair or bedside chair)?: A Little Help needed to walk in hospital room?: A Little Help needed climbing 3-5 steps with a railing? : A Little 6 Click Score: 20    End of Session Equipment Utilized During Treatment: Gait belt Activity Tolerance: Patient tolerated treatment well Patient left: with family/visitor present;in bed;with call bell/phone within reach Nurse Communication: Mobility status PT Visit Diagnosis: Other abnormalities of gait and mobility (R26.89)    Time: 6010-9323 PT Time Calculation (min) (ACUTE ONLY): 22 min   Charges:   PT Evaluation $PT Eval Low Complexity: 1 Low          Vickki Muff, PT, DPT   Acute Rehabilitation Department Pager #: [phone]  Ronnie Derby 01/18/2021, 3:46 PM

## 2021-01-18 NOTE — Progress Notes (Signed)
Pt discharged to home with family.  Pt's IV's removed.  Pt taken off telemetry and CCMD notified.  Pt went home with all of his personal belongings.  Wound care education given to Pt and wife.  AVS documentation reviewed and sent home with Pt and all questions answered.

## 2021-01-18 NOTE — Progress Notes (Addendum)
  Progress Note  EXTENSIVE WOUND OF THE RIGHT FOOT: This patient presents with an extensive wound of the right foot.  He is followed by podiatry.  He has a palpable dorsalis pedis pulse and his arteriogram yesterday showed no significant lower extremity arterial occlusive disease.  He does appear to have some small vessel disease in the foot but there is nothing to do from a vascular standpoint.  I would recommend continued aggressive wound care.  Certainly he is at risk of requiring a transmetatarsal amputation if this does not heal.  Vascular surgery will be available as needed.  Cari Caraway, MD 9:46 AM   01/18/2021 8:15 AM 1 Day Post-Op  Subjective:  no complaints   Vitals:   01/18/21 0320 01/18/21 0759  BP: 139/75 (!) 146/75  Pulse: 84 85  Resp: 17 15  Temp: 98.1 F (36.7 C) 97.7 F (36.5 C)  SpO2: 98% 98%    Physical Exam: General:  no distress Lungs:  non labored Incisions:  left groin without hematoma Extremities:  palpable DP pulses bilaterally   CBC    Component Value Date/Time   WBC 10.3 01/17/2021 0214   RBC 3.57 (L) 01/17/2021 0214   RBC 3.62 (L) 01/17/2021 0214   HGB 9.1 (L) 01/17/2021 0214   HCT 29.1 (L) 01/17/2021 0214   PLT 365 01/17/2021 0214   MCV 80.4 01/17/2021 0214   MCH 25.1 (L) 01/17/2021 0214   MCHC 31.3 01/17/2021 0214   RDW 13.9 01/17/2021 0214   LYMPHSABS 1.5 01/15/2021 1828   MONOABS 0.6 01/15/2021 1828   EOSABS 0.5 01/15/2021 1828   BASOSABS 0.1 01/15/2021 1828    BMET    Component Value Date/Time   NA 134 (L) 01/17/2021 0214   K 4.8 01/17/2021 0214   CL 108 01/17/2021 0214   CO2 21 (L) 01/17/2021 0214   GLUCOSE 160 (H) 01/17/2021 0214   BUN 13 01/17/2021 0214   CREATININE 1.14 01/17/2021 0214   CALCIUM 8.2 (L) 01/17/2021 0214   GFRNONAA >60 01/17/2021 0214   GFRAA >60 07/17/2017 1959    INR No results found for: INR   Intake/Output Summary (Last 24 hours) at 01/18/2021 0815 Last data filed at 01/18/2021 0355 Gross  per 24 hour  Intake 3061.41 ml  Output 600 ml  Net 2461.41 ml     Assessment/Plan:  50 y.o. male is s/p:  Aortogram with BLE runoff via left CFA  1 Day Post-Op   -pt left groin looks good without hematoma and pt has palpable DP pulses bilaterally. -aortogram reveals no hemodynamically significant arterial disease.   -will sign off-call for questions.   Doreatha Massed, PA-C [facility] [phone] 01/18/2021 8:15 AM

## 2021-01-18 NOTE — TOC Progression Note (Addendum)
Transition of Care [facility]) - Progression Note    Patient Details  Name: Richard Pittman MRN: [id number] Date of Birth: Apr 30, 1970  Transition of [facility] CM/SW Contact  Leone Haven, RN Phone Number: 01/18/2021, 11:09 AM  Clinical Narrative:    NCM spoke with patient in room, asked if he needed help in getting his medications, he states no he has money to get his medicaitons. NCM informed Olean Ree of this Psychologist, sport and exercise.  Patient stated that he has money to get his medications. MD states patient will need HHRN to show wife how to do the dry dressing.  [employer] is the charity agency today, NCM made referral to [employer], she is able to take for [facility] for charity.          Expected Discharge Plan and Services           Expected Discharge Date: 01/18/21                                     Social Determinants of Health (SDOH) Interventions    Readmission Risk Interventions No flowsheet data found.

## 2021-01-18 NOTE — Discharge Summary (Signed)
Physician Discharge Summary  Richard Pittman [place] WUJ:[id number] DOB: March 27, 1970 DOA: 01/15/2021  PCP: [name], PA-C  Admit date: 01/15/2021 Discharge date: 01/18/2021  Admitted From: Home  Disposition: Home    Recommendations for Outpatient Follow-up:  Follow up with PCP in 1-2 weeks Please obtain BMP/CBC in one week Follow up with podiatry for demarcation.   Home Health: [facility] Nurse  Discharge Condition: Stable.  CODE STATUS: Full code Diet recommendation: Carb Modified   Brief/Interim Summary: 50 year old with past medical history significant for diabetes, foot infection recent amputation of the fourth digit presents with worsening discoloration of the right foot.   Right foot infection  started after patient stepped on a nail  at a construction site.  He developed an abscess at the trauma site and subsequently underwent incision and drainage by Dr. Samuella Pittman on 01/08/2021.  Subsequently 48 hours later he underwent fourth toe amputation.   He went for follow-up to his podiatrist and was then refer for admission for further evaluation by vascular surgery, due to right foot changes consistent with vascular insufficiency.  1-Right foot wound, diabetic foot infection: -Patient wound infection started after he stepped on a nail at construction site.  -Recent hospitalization 11/23 for incision and drainage and subsequently fourth toe amputation. -He was referred for admission 11/30 for further evaluation of unhealing wound and changes consistent with vascular insufficiency. -Plan to continue with IV vancomycin and Zosyn  -Vascular surgery consulted. ABI ordered.  -Plan for arteriogram 12/2: No Hemodynamically significant arterial diseases.  -Follow Blood culture. No growth to date.  -Culture from foot: streptococcus anginosis, staph.  Betadine wet to dry for wound care.  Plan to discharge on Doxy and penicillin for 2 week per podiatry recommendation.  Will try to get [facility] RN  for wound care  No hemodynamically significant arterial diseases on aortogram. He might had small vessel diseases. He is at risk of requiring further amputation, I explain this to patient and wife.   2-AKI, hyperkalemia: Patient received IV fluids. Creatinine up to 1.4 on admission baseline of 1 Cr down to 1.3--1.1 Hyperkalemia resolved.    Diabetes: Continue with SSI. A1c; 8.1 He has been working on diet.  CBG well controlled.   resume home oral regimen.   Anemia: anemia panel. Iron deficiency.  Discharge on iron supplement.    Discharge Diagnoses:  Principal Problem:   Diabetic foot infection (HCC) Active Problems:   Foot infection   Uncontrolled type 2 diabetes mellitus with hyperglycemia (HCC)   AKI (acute kidney injury) (HCC)   Microcytic anemia   Hyperkalemia    Discharge Instructions  Discharge Instructions     Diet - low sodium heart healthy   Complete by: As directed    Discharge wound care:   Complete by: As directed    See above   Increase activity slowly   Complete by: As directed       Allergies as of 01/18/2021   No Known Allergies      Medication List     STOP taking these medications    cephALEXin 500 MG capsule Commonly known as: KEFLEX   naproxen 500 MG tablet Commonly known as: NAPROSYN   Santyl ointment Generic drug: collagenase   sulfamethoxazole-trimethoprim 800-160 MG tablet Commonly known as: BACTRIM DS       TAKE these medications    acetaminophen 500 MG tablet Commonly known as: TYLENOL Take 1,000 mg by mouth every 6 (six) hours as needed for mild pain.   doxycycline 100 MG  tablet Commonly known as: VIBRA-TABS Take 1 tablet (100 mg total) by mouth every 12 (twelve) hours for 14 days.   ferrous sulfate 325 (65 FE) MG tablet Take 1 tablet (325 mg total) by mouth daily.   glipiZIDE 10 MG tablet Commonly known as: GLUCOTROL Take 10 mg by mouth 2 (two) times daily before a meal.   metFORMIN 1000 MG (MOD) 24 hr  tablet Commonly known as: GLUMETZA Take 1,000 mg by mouth 2 (two) times daily with a meal.   penicillin v potassium 500 MG tablet Commonly known as: VEETID Take 1 tablet (500 mg total) by mouth every 6 (six) hours for 14 days.               Discharge Care Instructions  (From admission, onward)           Start     Ordered   01/18/21 0000  Discharge wound care:       Comments: See above   01/18/21 0913            Follow-up Information     Richard Pittman A, PA-C Follow up in 1 week(s).   Specialty: Physician Assistant Contact information: [address] [place] [address] [phone]         Richard Pittman, DPM Follow up in 1 week(s).   Specialty: Podiatry Contact information: [address] [place] [address] [phone]                No Known Allergies  Consultations: Podiatry Vascular   Procedures/Studies: MR FOOT RIGHT WO CONTRAST  Result Date: 01/07/2021 CLINICAL DATA:  Right foot pain and swelling.  Stepped on a nail. EXAM: MRI OF THE RIGHT FOREFOOT WITHOUT CONTRAST TECHNIQUE: Multiplanar, multisequence MR imaging of the right forefoot was performed. No intravenous contrast was administered. COMPARISON:  Right foot x-rays from same day. FINDINGS: Bones/Joint/Cartilage No marrow signal abnormality. No fracture or dislocation. Joint spaces are preserved. No joint effusion. Ligaments Collateral ligaments are intact.  Lisfranc ligament is intact. Muscles and Tendons Flexor and extensor tendons are intact. No tenosynovitis. Increased T2 signal within the intrinsic muscles of the forefoot, nonspecific, but likely related to diabetic muscle changes. Soft tissue Severe diffuse soft tissue swelling of the right foot. Puncture wound noted at the plantar base of the second proximal phalanx with the regular 2.2 x 4.6 x 2.9 cm fluid collection surrounding the second through fourth proximal phalanges (series 6, image 9; series 8, image 13).  Smaller small 8 mm fluid collection at the lateral plantar base of the fifth metatarsal head, likely an adventitial bursa. No soft tissue mass. IMPRESSION: 1. Puncture wound at the plantar base of the second proximal phalanx with 2.2 x 4.6 x 2.9 cm abscess surrounding the second through fourth proximal phalanges. No osteomyelitis. 2. Severe right foot cellulitis. Electronically Signed   By: Obie Dredge M.D.   On: 01/07/2021 22:19   DG Foot 2 Views Right  Result Date: 01/10/2021 CLINICAL DATA:  Status post right fourth digit amputation. EXAM: RIGHT FOOT - 2 VIEW COMPARISON:  None. FINDINGS: Status post amputation of the fourth phalanges. There is no acute fracture or dislocation. The soft tissue swelling of the foot. Dressing noted over the forefoot. IMPRESSION: Status post amputation of the fourth phalanges. Electronically Signed   By: Elgie Collard M.D.   On: 01/10/2021 19:24   DG Foot Complete Right  Result Date: 01/15/2021 CLINICAL DATA:  Foot infection redness to the third and second  toe EXAM: RIGHT FOOT COMPLETE - 3+ VIEW COMPARISON:  01/10/2021 FINDINGS: Interval fourth digit amputation at the level of the MTP. No fracture seen. No periostitis or osseous destructive change. Continued considerable soft tissue swelling over the distal foot. IMPRESSION: 1. Interval amputation of fourth digit at the level of the MTP joint. 2. Continued soft tissue swelling without acute osseous abnormality Electronically Signed   By: Jasmine Pang M.D.   On: 01/15/2021 20:20   DG Foot Complete Right  Result Date: 01/07/2021 CLINICAL DATA:  Wound infection EXAM: RIGHT FOOT COMPLETE - 3+ VIEW COMPARISON:  01/06/2021 FINDINGS: No fracture or malalignment. No radiopaque foreign body. Increased plantar soft tissue swelling without emphysema. No periostitis or osseous destructive change. There are vascular calcifications. IMPRESSION: Increasing plantar soft tissue swelling. No acute osseous abnormality.  Electronically Signed   By: Jasmine Pang M.D.   On: 01/07/2021 16:56   DG Foot Complete Right  Result Date: 01/06/2021 CLINICAL DATA:  Right foot pain.  History of puncture wound EXAM: RIGHT FOOT COMPLETE - 3+ VIEW COMPARISON:  None. FINDINGS: There is no evidence of fracture or dislocation. Joint spaces are relatively preserved. No erosion or periosteal elevation is evident. Soft tissue swelling the plantar aspect of the foot in the region of the distal forefoot. No soft tissue gas or radiopaque foreign body evident. Vascular calcifications are present. IMPRESSION: 1. Soft tissue swelling of the plantar aspect of the foot. 2. No acute osseous abnormality.  No radiopaque foreign body. Electronically Signed   By: Duanne Guess D.O.   On: 01/06/2021 12:25   VAS [place] ABI WITH/WO TBI  Result Date: 01/16/2021  LOWER EXTREMITY DOPPLER STUDY Patient Name:  Richard Pittman [facility]  Date of Exam:   01/16/2021 Medical Rec #: [id number]                Accession #:    [id number] Date of Birth: 11/10/1970                Patient Gender: M Patient Age:   73 years Exam Location:  [facility] Procedure:      VAS [place] ABI WITH/WO TBI Referring Phys: Lyn Hollingshead MELVIN --------------------------------------------------------------------------------  Indications: Ulceration, and nonhealing surgical wound of right foot. High Risk Factors: Diabetes.  Comparison Study: No prior study Performing Technologist: Sherren Kerns RVS  Examination Guidelines: A complete evaluation includes at minimum, Doppler waveform signals and systolic blood pressure reading at the level of bilateral brachial, anterior tibial, and posterior tibial arteries, when vessel segments are accessible. Bilateral testing is considered an integral part of a complete examination. Photoelectric Plethysmograph (PPG) waveforms and toe systolic pressure readings are included as required and additional duplex testing as needed. Limited examinations for reoccurring  indications may be performed as noted.  ABI Findings: +---------+------------------+-----+---------+--------+ Right    Rt Pressure (mmHg)IndexWaveform Comment  +---------+------------------+-----+---------+--------+ Brachial 141                    triphasic         +---------+------------------+-----+---------+--------+ PTA      170               1.14 biphasic          +---------+------------------+-----+---------+--------+ DP       166               1.11 biphasic          +---------+------------------+-----+---------+--------+ Oda Cogan  0.00                   +---------+------------------+-----+---------+--------+ +---------+------------------+-----+---------+-------+ Left     Lt Pressure (mmHg)IndexWaveform Comment +---------+------------------+-----+---------+-------+ Brachial 149                    triphasic        +---------+------------------+-----+---------+-------+ PTA      170               1.14 biphasic         +---------+------------------+-----+---------+-------+ DP       168               1.13 biphasic         +---------+------------------+-----+---------+-------+ Great Toe135               0.91                  +---------+------------------+-----+---------+-------+ +-------+-----------+-----------+------------+------------+ ABI/TBIToday's ABIToday's TBIPrevious ABIPrevious TBI +-------+-----------+-----------+------------+------------+ Right  1.14       0                                   +-------+-----------+-----------+------------+------------+ Left   1.14       0.91                                +-------+-----------+-----------+------------+------------+  Summary: Right: Resting right ankle-brachial index is within normal range. No evidence of significant right lower extremity arterial disease. The right toe-brachial index is abnormal. Left: Resting left ankle-brachial index is within normal range. No  evidence of significant left lower extremity arterial disease. The left toe-brachial index is normal.  *See table(s) above for measurements and observations.  Electronically signed by Sherald Hess MD on 01/16/2021 at 5:25:30 PM.    Final    VAS [place] LOWER EXTREMITY ARTERIAL DUPLEX  Result Date: 01/16/2021 LOWER EXTREMITY ARTERIAL DUPLEX STUDY Patient Name:  ALVIA TORY [facility]  Date of Exam:   01/16/2021 Medical Rec #: [id number]                Accession #:    [id number] Date of Birth: [birth year]                Patient Gender: M Patient Age:   22 years Exam Location:  [facility] Procedure:      VAS [place] LOWER EXTREMITY ARTERIAL DUPLEX Referring Phys: Emilie Rutter --------------------------------------------------------------------------------  Indications: Ulceration, and nonhealing surgical wound of right foot. High Risk Factors: Diabetes.  Current ABI: bilateral 1.14, absent right toe waveform/pressure, normal left TBI Comparison Study: no prior study on file Performing Technologist: Sherren Kerns RVS  Examination Guidelines: A complete evaluation includes B-mode imaging, spectral Doppler, color Doppler, and power Doppler as needed of all accessible portions of each vessel. Bilateral testing is considered an integral part of a complete examination. Limited examinations for reoccurring indications may be performed as noted.  +-----------+--------+-----+---------------+-----------+--------+ RIGHT      PSV cm/sRatioStenosis       Waveform   Comments +-----------+--------+-----+---------------+-----------+--------+ CFA Prox   160          30-49% stenosismultiphasic         +-----------+--------+-----+---------------+-----------+--------+ DFA        69  multiphasic         +-----------+--------+-----+---------------+-----------+--------+ SFA Prox   119                         multiphasic          +-----------+--------+-----+---------------+-----------+--------+ SFA Mid    127                         multiphasic         +-----------+--------+-----+---------------+-----------+--------+ SFA Distal 182          30-49% stenosismultiphasic         +-----------+--------+-----+---------------+-----------+--------+ POP Prox   181          30-49% stenosismultiphasic         +-----------+--------+-----+---------------+-----------+--------+ POP Distal 230          50-74% stenosismultiphasic         +-----------+--------+-----+---------------+-----------+--------+ TP Trunk   128                         multiphasic         +-----------+--------+-----+---------------+-----------+--------+ ATA Prox   75                          multiphasic         +-----------+--------+-----+---------------+-----------+--------+ ATA Mid    196          30-49% stenosismultiphasic         +-----------+--------+-----+---------------+-----------+--------+ ATA Distal 59                          multiphasic         +-----------+--------+-----+---------------+-----------+--------+ PTA Prox   122                         multiphasic         +-----------+--------+-----+---------------+-----------+--------+ PTA Mid    138                         multiphasic         +-----------+--------+-----+---------------+-----------+--------+ PTA Distal 125                         multiphasic         +-----------+--------+-----+---------------+-----------+--------+ PERO Prox  77                          multiphasic         +-----------+--------+-----+---------------+-----------+--------+ PERO Mid   70                          multiphasic         +-----------+--------+-----+---------------+-----------+--------+ PERO Distal70                          multiphasic         +-----------+--------+-----+---------------+-----------+--------+  Summary: Right: 30-49% stenosis  noted in the common femoral artery. 30-49% stenosis noted in the superficial femoral artery. 50-74% stenosis noted in the popliteal artery. 30-49% stenosis noted in the anterior tibial artery. Heterogenous plaque noted throughout the right lower extremity arterial system. Waveforms are multiphasic.  See table(s) above for measurements and observations. Electronically signed by Sherald Hess MD on 01/16/2021  at 5:32:37 PM.    Final    (Echo, Carotid, EGD, Colonoscopy, ERCP)    Subjective: Report mild pain.   Discharge Exam: Vitals:   01/18/21 0759 01/18/21 1229  BP: (!) 146/75 (!) 119/57  Pulse: 85 92  Resp: 15 16  Temp: 97.7 F (36.5 C) 98.5 F (36.9 C)  SpO2: 98% 97%     General: Pt is alert, awake, not in acute distress Cardiovascular: RRR, S1/S2 +, no rubs, no gallops Respiratory: CTA bilaterally, no wheezing, no rhonchi Abdominal: Soft, NT, ND, bowel sounds + Extremities: foot with discoloration of big toes, open wound plantar aspect no drainage.     The results of significant diagnostics from this hospitalization (including imaging, microbiology, ancillary and laboratory) are listed below for reference.     Microbiology: Recent Results (from the past 240 hour(s))  Culture, blood (single)     Status: None (Preliminary result)   Collection Time: 01/15/21  6:28 PM   Specimen: BLOOD  Result Value Ref Range Status   Specimen Description BLOOD SITE NOT SPECIFIED  Final   Special Requests   Final    BOTTLES DRAWN AEROBIC ONLY Blood Culture adequate volume   Culture   Final    NO GROWTH 3 DAYS Performed at [facility], [address] [place], [place] [address]    Report Status PENDING  Incomplete  Resp Panel by RT-PCR (Flu A&B, Covid) Nasopharyngeal Swab     Status: None   Collection Time: 01/15/21  6:28 PM   Specimen: Nasopharyngeal Swab; Nasopharyngeal(NP) swabs in vial transport medium  Result Value Ref Range Status   SARS Coronavirus 2 by RT PCR NEGATIVE  NEGATIVE Final    Comment: (NOTE) SARS-CoV-2 target nucleic acids are NOT DETECTED.  The SARS-CoV-2 RNA is generally detectable in upper respiratory specimens during the acute phase of infection. The lowest concentration of SARS-CoV-2 viral copies this assay can detect is 138 copies/mL. A negative result does not preclude SARS-Cov-2 infection and should not be used as the sole basis for treatment or other patient management decisions. A negative result may occur with  improper specimen collection/handling, submission of specimen other than nasopharyngeal swab, presence of viral mutation(s) within the areas targeted by this assay, and inadequate number of viral copies(<138 copies/mL). A negative result must be combined with clinical observations, patient history, and epidemiological information. The expected result is Negative.  Fact Sheet for Patients:  BloggerCourse.com  Fact Sheet for Healthcare Providers:  SeriousBroker.it  This test is no t yet approved or cleared by the [place] FDA and  has been authorized for detection and/or diagnosis of SARS-CoV-2 by FDA under an Emergency Use Authorization (EUA). This EUA will remain  in effect (meaning this test can be used) for the duration of the COVID-19 declaration under Section 564(b)(1) of the Act, 21 U.S.C.section 360bbb-3(b)(1), unless the authorization is terminated  or revoked sooner.       Influenza A by PCR NEGATIVE NEGATIVE Final   Influenza B by PCR NEGATIVE NEGATIVE Final    Comment: (NOTE) The Xpert Xpress SARS-CoV-2/FLU/RSV plus assay is intended as an aid in the diagnosis of influenza from Nasopharyngeal swab specimens and should not be used as a sole basis for treatment. Nasal washings and aspirates are unacceptable for Xpert Xpress SARS-CoV-2/FLU/RSV testing.  Fact Sheet for Patients: BloggerCourse.com  Fact Sheet for Healthcare  Providers: SeriousBroker.it  This test is not yet approved or cleared by the [place] FDA and has been authorized for detection  and/or diagnosis of SARS-CoV-2 by FDA under an Emergency Use Authorization (EUA). This EUA will remain in effect (meaning this test can be used) for the duration of the COVID-19 declaration under Section 564(b)(1) of the Act, 21 U.S.C. section 360bbb-3(b)(1), unless the authorization is terminated or revoked.  Performed at [facility], [address] [place], [place] [address]      Labs: BNP (last 3 results) No results for input(s): BNP in the last 8760 hours. Basic Metabolic Panel: Recent Labs  Lab 01/15/21 1828 01/16/21 0302 01/17/21 0214 01/18/21 0932  NA 135 135 134* 138  K 5.2* 4.9 4.8 4.8  CL 106 108 108 107  CO2 20* 18* 21* 21*  GLUCOSE 81 74 160* 127*  BUN 18 16 13 9   CREATININE 1.48* 1.34* 1.14 0.98  CALCIUM 8.6* 8.1* 8.2* 8.7*   Liver Function Tests: No results for input(s): AST, ALT, ALKPHOS, BILITOT, PROT, ALBUMIN in the last 168 hours. No results for input(s): LIPASE, AMYLASE in the last 168 hours. No results for input(s): AMMONIA in the last 168 hours. CBC: Recent Labs  Lab 01/15/21 1828 01/16/21 0302 01/17/21 0214 01/18/21 0932  WBC 11.0* 10.2 10.3 9.2  NEUTROABS 8.1*  --   --   --   HGB 10.6* 9.8* 9.1* 9.8*  HCT 34.0* 31.5* 29.1* 30.6*  MCV 81.7 81.8 80.4 79.5*  PLT 391 363 365 391   Cardiac Enzymes: No results for input(s): CKTOTAL, CKMB, CKMBINDEX, TROPONINI in the last 168 hours. BNP: Invalid input(s): POCBNP CBG: Recent Labs  Lab 01/17/21 1127 01/17/21 1636 01/17/21 2103 01/18/21 0619 01/18/21 1231  GLUCAP 153* 130* 150* 125* 191*   D-Dimer No results for input(s): DDIMER in the last 72 hours. Hgb A1c No results for input(s): HGBA1C in the last 72 hours. Lipid Profile No results for input(s): CHOL, HDL, LDLCALC, TRIG, CHOLHDL, LDLDIRECT in the last 72  hours. Thyroid function studies No results for input(s): TSH, T4TOTAL, T3FREE, THYROIDAB in the last 72 hours.  Invalid input(s): FREET3 Anemia work up Recent Labs    01/17/21 0214  VITAMINB12 407  FOLATE 10.5  FERRITIN 426*  TIBC 188*  IRON 37*  RETICCTPCT 1.6   Urinalysis    Component Value Date/Time   COLORURINE YELLOW 07/17/2017 1259   APPEARANCEUR CLEAR 07/17/2017 1259   LABSPEC 1.032 (H) 07/17/2017 1259   PHURINE 5.0 07/17/2017 1259   GLUCOSEU >=500 (A) 07/17/2017 1259   HGBUR SMALL (A) 07/17/2017 1259   BILIRUBINUR NEGATIVE 07/17/2017 1259   KETONESUR 20 (A) 07/17/2017 1259   PROTEINUR >=300 (A) 07/17/2017 1259   NITRITE NEGATIVE 07/17/2017 1259   LEUKOCYTESUR NEGATIVE 07/17/2017 1259   Sepsis Labs Invalid input(s): PROCALCITONIN,  WBC,  LACTICIDVEN Microbiology Recent Results (from the past 240 hour(s))  Culture, blood (single)     Status: None (Preliminary result)   Collection Time: 01/15/21  6:28 PM   Specimen: BLOOD  Result Value Ref Range Status   Specimen Description BLOOD SITE NOT SPECIFIED  Final   Special Requests   Final    BOTTLES DRAWN AEROBIC ONLY Blood Culture adequate volume   Culture   Final    NO GROWTH 3 DAYS Performed at [facility], [address], [place], [place]    Report Status PENDING  Incomplete  Resp Panel by RT-PCR (Flu A&B, Covid) Nasopharyngeal Swab     Status: None   Collection Time: 01/15/21  6:28 PM   Specimen: Nasopharyngeal Swab; Nasopharyngeal(NP) swabs in vial transport  medium  Result Value Ref Range Status   SARS Coronavirus 2 by RT PCR NEGATIVE NEGATIVE Final    Comment: (NOTE) SARS-CoV-2 target nucleic acids are NOT DETECTED.  The SARS-CoV-2 RNA is generally detectable in upper respiratory specimens during the acute phase of infection. The lowest concentration of SARS-CoV-2 viral copies this assay can detect is 138 copies/mL. A negative result does not preclude SARS-Cov-2 infection and should  not be used as the sole basis for treatment or other patient management decisions. A negative result may occur with  improper specimen collection/handling, submission of specimen other than nasopharyngeal swab, presence of viral mutation(s) within the areas targeted by this assay, and inadequate number of viral copies(<138 copies/mL). A negative result must be combined with clinical observations, patient history, and epidemiological information. The expected result is Negative.  Fact Sheet for Patients:  BloggerCourse.com  Fact Sheet for Healthcare Providers:  SeriousBroker.it  This test is no t yet approved or cleared by the [place] FDA and  has been authorized for detection and/or diagnosis of SARS-CoV-2 by FDA under an Emergency Use Authorization (EUA). This EUA will remain  in effect (meaning this test can be used) for the duration of the COVID-19 declaration under Section 564(b)(1) of the Act, 21 U.S.C.section 360bbb-3(b)(1), unless the authorization is terminated  or revoked sooner.       Influenza A by PCR NEGATIVE NEGATIVE Final   Influenza B by PCR NEGATIVE NEGATIVE Final    Comment: (NOTE) The Xpert Xpress SARS-CoV-2/FLU/RSV plus assay is intended as an aid in the diagnosis of influenza from Nasopharyngeal swab specimens and should not be used as a sole basis for treatment. Nasal washings and aspirates are unacceptable for Xpert Xpress SARS-CoV-2/FLU/RSV testing.  Fact Sheet for Patients: BloggerCourse.com  Fact Sheet for Healthcare Providers: SeriousBroker.it  This test is not yet approved or cleared by the [place] FDA and has been authorized for detection and/or diagnosis of SARS-CoV-2 by FDA under an Emergency Use Authorization (EUA). This EUA will remain in effect (meaning this test can be used) for the duration of the COVID-19 declaration under  Section 564(b)(1) of the Act, 21 U.S.C. section 360bbb-3(b)(1), unless the authorization is terminated or revoked.  Performed at [facility], [address], [place], [place] [address]      Time coordinating discharge: 40 minutes  SIGNED:   Alba Cory, MD  [facility]

## 2021-01-20 LAB — CULTURE, BLOOD (SINGLE)
Culture: NO GROWTH
Special Requests: ADEQUATE

## 2021-01-20 LAB — GLUCOSE, CAPILLARY: Glucose-Capillary: 87 mg/dL (ref 70–99)

## 2021-01-20 NOTE — Progress Notes (Signed)
  Subjective:  Patient ID: Richard Pittman, male    DOB: 1970/05/18,  MRN: [id number]  Chief Complaint  Patient presents with   Routine Post Op    Right foot I&D with 4th toe amputation.  Patient denies nausea, vomiting, fever and chills and very little pain reported today.     DOS: 01/08/21 Procedure: I and D Right foot  DOS: 01/10/21 Procedure: D and I Right foot, Right 4th toe amputation  50 y.o. male presents with the above complaint. History confirmed with patient. Concerned about the appearance of the digits. Pain controlled. Has been dressing the toes as directed.  Objective:  Physical Exam: tenderness at the surgical site, local edema noted, and calf supple, nontender. Palpable pedal pulses Wound: large plantar 2nd-4th MPJ wound with fibrogranular base. Serous drainage noted. Pallor to hallux with circular abrasion around the toe. Absent sensation to hallux. 3rd toe gangrenous changes distally. Sensation intact to this digit. No warmth, erythema, ascending cellulitis or lymphangitis.  Assessment:   1. Foot infection   2. Post-operative state   3. Gangrene of toe of right foot (HCC)    Plan:  Patient was evaluated and treated and all questions answered.  Post-operative State -Plantar wound healing rather well with granulating wound base -The 3rd toe looks like it is demarcating. -There is ischemia of the hallux with abrasion. Unsure if this is secondary to bandaging or if there is gangrene forming of this digit. This digit is outside the debrided area. As he has palpable pedal pulses, unsure if this is small vessel disease or 2/2 increased arterial demand from injury and necessary surgery. Will monitor closely.  -Dressed with santyl WTD today. Educated on proper application. Refill santyl. Looked into patient assistance program but patient does not qualify. Expressed this to patient's son after his visit. -Will continue to monitor. -XRs needed at follow-up:  none  F/u in 1 week for recheck.

## 2021-01-21 ENCOUNTER — Ambulatory Visit (INDEPENDENT_AMBULATORY_CARE_PROVIDER_SITE_OTHER): Payer: Self-pay | Admitting: Podiatry

## 2021-01-21 ENCOUNTER — Encounter (HOSPITAL_COMMUNITY): Payer: Self-pay | Admitting: Vascular Surgery

## 2021-01-21 ENCOUNTER — Other Ambulatory Visit: Payer: Self-pay

## 2021-01-21 DIAGNOSIS — Z9889 Other specified postprocedural states: Secondary | ICD-10-CM

## 2021-01-21 DIAGNOSIS — I96 Gangrene, not elsewhere classified: Secondary | ICD-10-CM

## 2021-01-21 NOTE — Telephone Encounter (Signed)
Spoke to patient's son 01/14/21

## 2021-01-21 NOTE — Progress Notes (Signed)
  Subjective:  Patient ID: Richard Pittman, male    DOB: [birth year],  MRN: [id number]  Chief Complaint  Patient presents with   Routine Post Op    F/U hosital -pt deneis N/V/f/Ch -pt states," looks some better only the great toe is still dark." - less swelling and drainage - no warmth/odor - 2/10 sorness - povidone sticks and dressing change daily     DOS: 01/08/21 Procedure: I and D Right foot  DOS: 01/10/21 Procedure: D and I Right foot, Right 4th toe amputation  50 y.o. male presents with the above complaint. History confirmed with patient. The great toe is darker but the wound is doing well overall per patient. Changing daily with betadine.  Objective:  Physical Exam: tenderness at the surgical site, local edema noted, and calf supple, nontender. Palpable pedal pulses Wound: large plantar 2nd-4th MPJ wound measuring 5x3.5 with fibrogranular base. Serous drainage noted only. Dry gangrene to hallux with linear laceration at the 1st interspace. Absent sensation to hallux. 3rd toe gangrenous changes distally. Sensation intact to this digit. No warmth, erythema, ascending cellulitis or lymphangitis.  Assessment:   1. Gangrene of toe of right foot (HCC)   2. Post-operative state     Plan:  Patient was evaluated and treated and all questions answered.  Post-operative State -Plantar wound continues to be healing rather well with granulating wound base -Worsened gangrene to hallux. Will have to let this demarcate. Reviewed hospital records - no flow limiting stenosis. -3rd toe gangrene appears to be demarcating and he may have healthy tissue under this. Will not debride at this time. -Dispensed betadine and gauze supplies courtesy to patient. -Complete course of previously rxed abx, no new abx. -Will continue to monitor. Likely surgical intervention once the wound fully demarcates. -XRs needed at follow-up: none  F/u in 1 week for recheck.

## 2021-01-28 ENCOUNTER — Ambulatory Visit (INDEPENDENT_AMBULATORY_CARE_PROVIDER_SITE_OTHER): Payer: Self-pay | Admitting: Podiatry

## 2021-01-28 ENCOUNTER — Other Ambulatory Visit: Payer: Self-pay

## 2021-01-28 DIAGNOSIS — Z9889 Other specified postprocedural states: Secondary | ICD-10-CM

## 2021-01-28 DIAGNOSIS — I96 Gangrene, not elsewhere classified: Secondary | ICD-10-CM

## 2021-01-28 DIAGNOSIS — L089 Local infection of the skin and subcutaneous tissue, unspecified: Secondary | ICD-10-CM

## 2021-01-31 NOTE — Progress Notes (Signed)
°  Subjective:  Patient ID: Richard Pittman, male    DOB: 09/16/1970,  MRN: [id number]  Chief Complaint  Patient presents with   Routine Post Op    1wk fu Right foot I and D with 4th toe amputation    DOS: 01/08/21 Procedure: I and D Right foot  DOS: 01/10/21 Procedure: D and I Right foot, Right 4th toe amputation  50 y.o. male presents with the above complaint. History confirmed with patient. Patient's son Richard Pittman on the phone to help with translation for this visit. Patient states the foot has been doing well denies worsening pain or appearance of wound. Has been dressing the area as instructed.  Objective:  Physical Exam: tenderness at the surgical site, local edema noted, and calf supple, nontender. Palpable pedal pulses Wound: large plantar 2nd-4th MPJ wound measuring 5*3 with fibrogranular base. Serous drainage noted only. Dry gangrene to hallux distally with some areas of pink tissue underneath the eschar. Less healthy plantarly. Absent sensation to hallux. 3rd toe gangrenous changes distally but with pink tissue underneath the eschar. Sensation intact to this digit. No warmth, erythema, ascending cellulitis or lymphangitis.  Assessment:   1. Gangrene of toe of right foot (HCC)   2. Post-operative state   3. Foot infection      Plan:  Patient was evaluated and treated and all questions answered.  Post-operative State -Plantar wound continues to improve. -Dark area of 4th toe appears improving - when some of this eschar was removed he had healthy pink tissue underneath suggesting potential viability of at least Pittman of the digit. Similarly the hallux has some dark tissue with healthy tissue under this dorsally, a little less so plantarly. This digit may at least be partially salvageable. As previously discussed macrocirculation adequate and will have to await eithr healing or demarcation.  -Switched to povidone ointment. Dispensed to patient. -No further abx at this  time. -Will continue to monitor. Plan for likely debridement at next visit to enhance healing. May have to consider amputation of one or both of the digits is unsalvageable. -XRs needed at follow-up: none  F/u in 1 week for recheck.

## 2021-02-04 ENCOUNTER — Other Ambulatory Visit: Payer: Self-pay

## 2021-02-04 ENCOUNTER — Encounter: Payer: Self-pay | Admitting: Podiatry

## 2021-02-04 ENCOUNTER — Ambulatory Visit (INDEPENDENT_AMBULATORY_CARE_PROVIDER_SITE_OTHER): Payer: Self-pay | Admitting: Podiatry

## 2021-02-04 ENCOUNTER — Ambulatory Visit: Payer: Self-pay | Admitting: Podiatry

## 2021-02-04 DIAGNOSIS — E11621 Type 2 diabetes mellitus with foot ulcer: Secondary | ICD-10-CM

## 2021-02-04 DIAGNOSIS — L97413 Non-pressure chronic ulcer of right heel and midfoot with necrosis of muscle: Secondary | ICD-10-CM

## 2021-02-04 DIAGNOSIS — I96 Gangrene, not elsewhere classified: Secondary | ICD-10-CM

## 2021-02-04 NOTE — H&P (View-Only) (Signed)
°  Subjective:  Patient ID: Richard Pittman, male    DOB: 04/09/1970,  MRN: [id number]  Chief Complaint  Patient presents with   Foot Ulcer    Follow up ulcers plantar and 1st/4th toe right   "I think its looking a little bit better"    DOS: 01/08/21 Procedure: I and D Right foot  DOS: 01/10/21 Procedure: D and I Right foot, Right 4th toe amputation  50 y.o. male presents with the above complaint. History confirmed with patient. Dressing daily as directed, thinks the wound is doing better.  Objective:  Physical Exam: tenderness at the surgical site, local edema noted, and calf supple, nontender. Palpable pedal pulses Wound: large plantar 2nd-4th MPJ wound measuring 5*3 with fibrogranular base. Serous drainage noted only. Dry gangrene to hallux distally signs of demarcation. Less healthy plantarly. Absent sensation to hallux. 3rd toe gangrenous changes with signs of demarcation. Sensation intact to this digit. No warmth, erythema, ascending cellulitis or lymphangitis.  Assessment:   1. Gangrene of toe of right foot (HCC)   2. Diabetic ulcer of right midfoot associated with type 2 diabetes mellitus, with necrosis of muscle (HCC)    Plan:  Patient was evaluated and treated and all questions answered.  Post-operative State -Plantar wound continues to improve. 1st toe and to a lesser extent 4th toe more demarcated today. Unclear if these ultimately will be viable. Discussed with patient proceeding with debridement of the wound to promote healing and amputation of the digits if unsalvageable. Patient understands and agrees to proceed. -Dressed with povidone and dry dressing today. Continue daily -No sings of infection noted today, no abx needed. -Patient has failed conservative therapy and wishes to proceed with surgical intervention. All risks, benefits, and alternatives discussed with patient. No guarantees given. Consent reviewed and signed by patient. -Planned procedures: right  foot wound debridement and irrigation, possible amputation of 1st and 3rd toes (partial or total)    -XRs needed at follow-up: none  F/u in 1 week for recheck.

## 2021-02-04 NOTE — Progress Notes (Signed)
°  Subjective:  Patient ID: Richard Pittman, male    DOB: 04/09/1970,  MRN: [id number]  Chief Complaint  Patient presents with   Foot Ulcer    Follow up ulcers plantar and 1st/4th toe right   "I think its looking a little bit better"    DOS: 01/08/21 Procedure: I and D Right foot  DOS: 01/10/21 Procedure: D and I Right foot, Right 4th toe amputation  50 y.o. male presents with the above complaint. History confirmed with patient. Dressing daily as directed, thinks the wound is doing better.  Objective:  Physical Exam: tenderness at the surgical site, local edema noted, and calf supple, nontender. Palpable pedal pulses Wound: large plantar 2nd-4th MPJ wound measuring 5*3 with fibrogranular base. Serous drainage noted only. Dry gangrene to hallux distally signs of demarcation. Less healthy plantarly. Absent sensation to hallux. 3rd toe gangrenous changes with signs of demarcation. Sensation intact to this digit. No warmth, erythema, ascending cellulitis or lymphangitis.  Assessment:   1. Gangrene of toe of right foot (HCC)   2. Diabetic ulcer of right midfoot associated with type 2 diabetes mellitus, with necrosis of muscle (HCC)    Plan:  Patient was evaluated and treated and all questions answered.  Post-operative State -Plantar wound continues to improve. 1st toe and to a lesser extent 4th toe more demarcated today. Unclear if these ultimately will be viable. Discussed with patient proceeding with debridement of the wound to promote healing and amputation of the digits if unsalvageable. Patient understands and agrees to proceed. -Dressed with povidone and dry dressing today. Continue daily -No sings of infection noted today, no abx needed. -Patient has failed conservative therapy and wishes to proceed with surgical intervention. All risks, benefits, and alternatives discussed with patient. No guarantees given. Consent reviewed and signed by patient. -Planned procedures: right  foot wound debridement and irrigation, possible amputation of 1st and 3rd toes (partial or total)    -XRs needed at follow-up: none  F/u in 1 week for recheck.

## 2021-02-05 ENCOUNTER — Other Ambulatory Visit: Payer: Self-pay

## 2021-02-05 ENCOUNTER — Encounter (HOSPITAL_BASED_OUTPATIENT_CLINIC_OR_DEPARTMENT_OTHER): Payer: Self-pay | Admitting: Podiatry

## 2021-02-05 NOTE — Progress Notes (Addendum)
Spoke w/ via phone for pre-op interview---pt with spanish pacific interpreters number [phone] Lab needs dos----    I stat ekg           Lab results------none COVID test -----patient states asymptomatic no test needed Arrive at -------1210 pm 02-12-2021 NPO after MN NO Solid Food.  Water  from MN until---1100 am then npo Med rec completed Medications to take morning of surgery -----none Diabetic medication -----none day of surgery Patient instructed no nail polish to be worn day of surgery Patient instructed to bring photo id and insurance card day of surgery Patient aware to have Driver (ride ) / caregiver    for 24 hours after surgery  [name] Patient Special Instructions -----none Pre-Op special Istructions -----none Patient verbalized understanding of instructions that were given at this phone interview. Patient denies shortness of breath, chest pain, fever, cough at this phone interview.   Spanish interpreter requested for dos, email on chart  H & P  medical clearance note malia  [place] dated 02-05-2021 on chart for 02-12-2021 surgery

## 2021-02-12 ENCOUNTER — Other Ambulatory Visit: Payer: Self-pay

## 2021-02-12 ENCOUNTER — Ambulatory Visit (HOSPITAL_COMMUNITY)
Admission: RE | Admit: 2021-02-12 | Discharge: 2021-02-12 | Disposition: A | Discharge status: Home / Self Care | Payer: Self-pay | Source: Ambulatory Visit | Attending: Podiatry | Admitting: Podiatry

## 2021-02-12 ENCOUNTER — Encounter (HOSPITAL_BASED_OUTPATIENT_CLINIC_OR_DEPARTMENT_OTHER): Payer: Self-pay | Admitting: Podiatry

## 2021-02-12 ENCOUNTER — Ambulatory Visit (HOSPITAL_BASED_OUTPATIENT_CLINIC_OR_DEPARTMENT_OTHER): Payer: Self-pay | Admitting: Anesthesiology

## 2021-02-12 ENCOUNTER — Encounter: Payer: Self-pay | Admitting: Podiatry

## 2021-02-12 ENCOUNTER — Encounter (HOSPITAL_BASED_OUTPATIENT_CLINIC_OR_DEPARTMENT_OTHER)
Admission: RE | Disposition: A | Discharge status: Home / Self Care | Payer: Self-pay | Source: Ambulatory Visit | Attending: Podiatry

## 2021-02-12 DIAGNOSIS — L97513 Non-pressure chronic ulcer of other part of right foot with necrosis of muscle: Secondary | ICD-10-CM | POA: Insufficient documentation

## 2021-02-12 DIAGNOSIS — I96 Gangrene, not elsewhere classified: Secondary | ICD-10-CM

## 2021-02-12 DIAGNOSIS — L97413 Non-pressure chronic ulcer of right heel and midfoot with necrosis of muscle: Secondary | ICD-10-CM

## 2021-02-12 DIAGNOSIS — E1152 Type 2 diabetes mellitus with diabetic peripheral angiopathy with gangrene: Secondary | ICD-10-CM | POA: Insufficient documentation

## 2021-02-12 DIAGNOSIS — E11621 Type 2 diabetes mellitus with foot ulcer: Secondary | ICD-10-CM | POA: Insufficient documentation

## 2021-02-12 HISTORY — PX: WOUND DEBRIDEMENT: SHX247

## 2021-02-12 HISTORY — DX: Disorder of the skin and subcutaneous tissue, unspecified: L98.9

## 2021-02-12 HISTORY — DX: Dependence on other enabling machines and devices: Z99.89

## 2021-02-12 HISTORY — PX: AMPUTATION TOE: SHX6595

## 2021-02-12 LAB — POCT I-STAT, CHEM 8
BUN: 12 mg/dL (ref 6–20)
Calcium, Ion: 1.17 mmol/L (ref 1.15–1.40)
Chloride: 107 mmol/L (ref 98–111)
Creatinine, Ser: 1.1 mg/dL (ref 0.61–1.24)
Glucose, Bld: 189 mg/dL — ABNORMAL HIGH (ref 70–99)
HCT: 36 % — ABNORMAL LOW (ref 39.0–52.0)
Hemoglobin: 12.2 g/dL — ABNORMAL LOW (ref 13.0–17.0)
Potassium: 4.4 mmol/L (ref 3.5–5.1)
Sodium: 139 mmol/L (ref 135–145)
TCO2: 21 mmol/L — ABNORMAL LOW (ref 22–32)

## 2021-02-12 LAB — GLUCOSE, CAPILLARY: Glucose-Capillary: 153 mg/dL — ABNORMAL HIGH (ref 70–99)

## 2021-02-12 SURGERY — DEBRIDEMENT, WOUND
Anesthesia: Monitor Anesthesia Care | Site: Toe | Laterality: Right

## 2021-02-12 MED ORDER — LACTATED RINGERS IV SOLN: INTRAVENOUS | Status: DC

## 2021-02-12 MED ORDER — ACETAMINOPHEN 500 MG PO TABS
ORAL_TABLET | ORAL | Status: AC
  Filled 2021-02-12: qty 2

## 2021-02-12 MED ORDER — SODIUM CHLORIDE 0.9 % IR SOLN
Status: DC | PRN
  Administered 2021-02-12: 1000 mL

## 2021-02-12 MED ORDER — FENTANYL CITRATE (PF) 100 MCG/2ML IJ SOLN
INTRAMUSCULAR | Status: AC
  Filled 2021-02-12: qty 2

## 2021-02-12 MED ORDER — 0.9 % SODIUM CHLORIDE (POUR BTL) OPTIME
TOPICAL | Status: DC | PRN
  Administered 2021-02-12: 14:00:00 500 mL

## 2021-02-12 MED ORDER — PROPOFOL 500 MG/50ML IV EMUL
INTRAVENOUS | Status: DC | PRN
  Administered 2021-02-12: 125 ug/kg/min via INTRAVENOUS

## 2021-02-12 MED ORDER — ONDANSETRON HCL 4 MG/2ML IJ SOLN
INTRAMUSCULAR | Status: AC
  Filled 2021-02-12: qty 2

## 2021-02-12 MED ORDER — ONDANSETRON HCL 4 MG/2ML IJ SOLN: 4.0000 mg | Freq: Once | INTRAMUSCULAR | Status: DC | PRN

## 2021-02-12 MED ORDER — FENTANYL CITRATE (PF) 100 MCG/2ML IJ SOLN
INTRAMUSCULAR | Status: DC | PRN
  Administered 2021-02-12: 50 ug via INTRAVENOUS

## 2021-02-12 MED ORDER — BUPIVACAINE HCL (PF) 0.5 % IJ SOLN
INTRAMUSCULAR | Status: DC | PRN
  Administered 2021-02-12: 10 mL

## 2021-02-12 MED ORDER — CEFAZOLIN SODIUM-DEXTROSE 2-4 GM/100ML-% IV SOLN
INTRAVENOUS | Status: AC
  Filled 2021-02-12: qty 100

## 2021-02-12 MED ORDER — VANCOMYCIN HCL 1000 MG IV SOLR
INTRAVENOUS | Status: DC | PRN
  Administered 2021-02-12: 2 g via TOPICAL

## 2021-02-12 MED ORDER — PROPOFOL 500 MG/50ML IV EMUL
INTRAVENOUS | Status: AC
  Filled 2021-02-12: qty 50

## 2021-02-12 MED ORDER — ACETAMINOPHEN 500 MG PO TABS
1000.0000 mg | ORAL_TABLET | Freq: Once | ORAL | Status: AC
  Administered 2021-02-12: 13:00:00 1000 mg via ORAL

## 2021-02-12 MED ORDER — OXYCODONE HCL 5 MG/5ML PO SOLN: 5.0000 mg | Freq: Once | ORAL | Status: DC | PRN

## 2021-02-12 MED ORDER — ONDANSETRON HCL 4 MG/2ML IJ SOLN
INTRAMUSCULAR | Status: DC | PRN
  Administered 2021-02-12: 4 mg via INTRAVENOUS

## 2021-02-12 MED ORDER — MIDAZOLAM HCL 5 MG/5ML IJ SOLN
INTRAMUSCULAR | Status: DC | PRN
  Administered 2021-02-12: 2 mg via INTRAVENOUS

## 2021-02-12 MED ORDER — FENTANYL CITRATE (PF) 100 MCG/2ML IJ SOLN: 25.0000 ug | INTRAMUSCULAR | Status: DC | PRN

## 2021-02-12 MED ORDER — AMISULPRIDE (ANTIEMETIC) 5 MG/2ML IV SOLN: 10.0000 mg | Freq: Once | INTRAVENOUS | Status: DC | PRN

## 2021-02-12 MED ORDER — MIDAZOLAM HCL 2 MG/2ML IJ SOLN
INTRAMUSCULAR | Status: AC
  Filled 2021-02-12: qty 2

## 2021-02-12 MED ORDER — OXYCODONE HCL 5 MG PO TABS: 5.0000 mg | ORAL_TABLET | Freq: Once | ORAL | Status: DC | PRN

## 2021-02-12 MED ORDER — CEPHALEXIN 500 MG PO CAPS: 500.0000 mg | ORAL_CAPSULE | Freq: Two times a day (BID) | ORAL | 0 refills | Status: DC

## 2021-02-12 MED ORDER — CEFAZOLIN SODIUM-DEXTROSE 2-4 GM/100ML-% IV SOLN
2.0000 g | INTRAVENOUS | Status: AC
  Administered 2021-02-12: 14:00:00 2 g via INTRAVENOUS

## 2021-02-12 SURGICAL SUPPLY — 78 items
BAG COUNTER SPONGE SURGICOUNT (BAG) IMPLANT
BAG SURGICOUNT SPONGE COUNTING (BAG)
BLADE AVERAGE 25MMX9MM (BLADE) ×1
BLADE AVERAGE 25X9 (BLADE) ×1 IMPLANT
BLADE HEX COATED 2.75 (ELECTRODE) ×4 IMPLANT
BLADE OSC/SAG .038X5.5 CUT EDG (BLADE) IMPLANT
BLADE OSCILLATING/SAGITTAL (BLADE)
BLADE SURG 15 STRL LF DISP TIS (BLADE) ×2 IMPLANT
BLADE SURG 15 STRL SS (BLADE) ×2
BLADE SW THK.38XMED LNG THN (BLADE) IMPLANT
BNDG CONFORM 3 STRL LF (GAUZE/BANDAGES/DRESSINGS) ×4 IMPLANT
BNDG ELASTIC 3X5.8 VLCR STR LF (GAUZE/BANDAGES/DRESSINGS) ×4 IMPLANT
BNDG ELASTIC 4X5.8 VLCR STR LF (GAUZE/BANDAGES/DRESSINGS) ×2 IMPLANT
BNDG ELASTIC 6X5.8 VLCR STR LF (GAUZE/BANDAGES/DRESSINGS) ×4 IMPLANT
BNDG ESMARK 4X9 LF (GAUZE/BANDAGES/DRESSINGS) ×4 IMPLANT
BNDG GAUZE ELAST 4 BULKY (GAUZE/BANDAGES/DRESSINGS) ×4 IMPLANT
CHLORAPREP W/TINT 26 (MISCELLANEOUS) ×4 IMPLANT
CNTNR URN SCR LID CUP LEK RST (MISCELLANEOUS) IMPLANT
CONT SPEC 4OZ STRL OR WHT (MISCELLANEOUS)
COVER BACK TABLE 60X90IN (DRAPES) ×4 IMPLANT
COVER SURGICAL LIGHT HANDLE (MISCELLANEOUS) ×4 IMPLANT
CUFF TOURN SGL QUICK 18 (TOURNIQUET CUFF) IMPLANT
CUFF TOURN SGL QUICK 18X4 (TOURNIQUET CUFF) IMPLANT
DECANTER SPIKE VIAL GLASS SM (MISCELLANEOUS) IMPLANT
DRAPE EXTREMITY T 121X128X90 (DISPOSABLE) ×4 IMPLANT
DRAPE IMP U-DRAPE 54X76 (DRAPES) ×4 IMPLANT
DRAPE U-SHAPE 47X51 STRL (DRAPES) ×4 IMPLANT
DRSG EMULSION OIL 3X3 NADH (GAUZE/BANDAGES/DRESSINGS) ×4 IMPLANT
DRSG PAD ABDOMINAL 8X10 ST (GAUZE/BANDAGES/DRESSINGS) IMPLANT
ELECT REM PT RETURN 15FT ADLT (MISCELLANEOUS) ×4 IMPLANT
GAUZE 4X4 16PLY ~~LOC~~+RFID DBL (SPONGE) ×2 IMPLANT
GAUZE SPONGE 4X4 12PLY STRL (GAUZE/BANDAGES/DRESSINGS) ×4 IMPLANT
GAUZE XEROFORM 1X8 LF (GAUZE/BANDAGES/DRESSINGS) ×4 IMPLANT
GAUZE XEROFORM 5X9 LF (GAUZE/BANDAGES/DRESSINGS) IMPLANT
GLOVE SRG 8 PF TXTR STRL LF DI (GLOVE) ×2 IMPLANT
GLOVE SURG ENC MOIS LTX SZ7.5 (GLOVE) ×4 IMPLANT
GLOVE SURG ENC MOIS LTX SZ8 (GLOVE) ×4 IMPLANT
GLOVE SURG NEOPR MICRO LF SZ8 (GLOVE) ×4 IMPLANT
GLOVE SURG UNDER POLY LF SZ8 (GLOVE) ×2
GOWN L4 XXLG W/PAP TWL (GOWN DISPOSABLE) ×4 IMPLANT
GOWN STRL REUS W/ TWL LRG LVL3 (GOWN DISPOSABLE) ×2 IMPLANT
GOWN STRL REUS W/TWL LRG LVL3 (GOWN DISPOSABLE) ×2
GOWN STRL REUS W/TWL XL LVL3 (GOWN DISPOSABLE) ×8 IMPLANT
HANDPIECE INTERPULSE COAX TIP (DISPOSABLE)
KIT TURNOVER CYSTO (KITS) IMPLANT
MANIFOLD NEPTUNE II (INSTRUMENTS) ×4 IMPLANT
NDL HYPO 25X1 1.5 SAFETY (NEEDLE) ×2 IMPLANT
NEEDLE HYPO 25X1 1.5 SAFETY (NEEDLE) ×4 IMPLANT
NS IRRIG 1000ML POUR BTL (IV SOLUTION) ×2 IMPLANT
PACK BASIN DAY SURGERY FS (CUSTOM PROCEDURE TRAY) ×4 IMPLANT
PACK ORTHO EXTREMITY (CUSTOM PROCEDURE TRAY) ×4 IMPLANT
PADDING CAST ABS 4INX4YD NS (CAST SUPPLIES) ×2
PADDING CAST ABS COTTON 4X4 ST (CAST SUPPLIES) ×2 IMPLANT
PADDING UNDERCAST 2 STRL (CAST SUPPLIES) ×2
PADDING UNDERCAST 2X4 STRL (CAST SUPPLIES) ×2 IMPLANT
PENCIL SMOKE EVACUATOR (MISCELLANEOUS) ×4 IMPLANT
PROBE DEBRIDE SONICVAC MISONIX (TIP) ×2 IMPLANT
SET HNDPC FAN SPRY TIP SCT (DISPOSABLE) IMPLANT
SET IRRIG Y TYPE TUR BLADDER L (SET/KITS/TRAYS/PACK) IMPLANT
SLEEVE SCD COMPRESS KNEE MED (STOCKING) ×4 IMPLANT
SPONGE SURGIFOAM ABS GEL 100 (HEMOSTASIS) IMPLANT
STAPLER VISISTAT 35W (STAPLE) ×4 IMPLANT
STOCKINETTE IMPERVIOUS LG (DRAPES) ×8 IMPLANT
SUT ETHILON 3 0 PS 1 (SUTURE) IMPLANT
SUT ETHILON 4 0 PS 2 18 (SUTURE) ×4 IMPLANT
SUT MNCRL AB 3-0 PS2 18 (SUTURE) IMPLANT
SUT MNCRL AB 4-0 PS2 18 (SUTURE) IMPLANT
SUT MON AB 5-0 PS2 18 (SUTURE) IMPLANT
SUT VIC AB 3-0 FS2 27 (SUTURE) ×4 IMPLANT
SUT VIC AB 4-0 PS2 18 (SUTURE) ×4 IMPLANT
SYR 20ML LL LF (SYRINGE) IMPLANT
SYR BULB EAR ULCER 3OZ GRN STR (SYRINGE) ×4 IMPLANT
SYR CONTROL 10ML LL (SYRINGE) ×4 IMPLANT
TOWEL OR 17X26 10 PK STRL BLUE (TOWEL DISPOSABLE) ×4 IMPLANT
TOWEL OR NON WOVEN STRL DISP B (DISPOSABLE) ×4 IMPLANT
TUBE IRRIGATION SET MISONIX (TUBING) ×2 IMPLANT
UNDERPAD 30X36 HEAVY ABSORB (UNDERPADS AND DIAPERS) ×8 IMPLANT
YANKAUER SUCT BULB TIP NO VENT (SUCTIONS) ×4 IMPLANT

## 2021-02-12 NOTE — Op Note (Signed)
Patient Name: Richard Pittman DOB: 06-27-1970  MRN: [id number]   Date of Service: 02/12/21   Surgeon: Dr. Hardie Pulley, DPM Assistants: None Pre-operative Diagnosis: Ulcer right foot Post-operative Diagnosis: same Procedures:             1) Debridement and irrigation right foot wound  2) Partial amputation right great toe Pathology/Specimens: ID Type Source Tests Collected by Time Destination  1 : PARTIAL AMPUTATION RIGHT GREAT TOE Tissue PATH Soft tissue SURGICAL PATHOLOGY Evelina Bucy, DPM 02/12/2021 1423    Anesthesia: MAC/local Hemostasis: Anatomic Estimated Blood Loss: 5 ml Materials: None Medications: none Complications: None  Indications for Procedure:  This is a 50 y.o. male with a chronic right foot wound. He presents for debridement to promote healing. He additionally was known to have necrotic areas of the 1st toe and 3rd toe. It was discussed he could benefit from amputation of these digits if deemed non-salvageable.   Procedure in Detail: Patient was identified in pre-operative holding area. Formal consent was signed and the right lower extremity was marked. Patient was brought back to the operating room and placed on the operating room table in the supine position. Anesthesia was induced.   The extremity was prepped and draped in the usual sterile fashion. Timeout was taken to confirm patient name, laterality, and procedure prior to incision. Attention was then directed to the right foot where a wound  was encountered. The wound was overall granular but with several areas of fibrosis.  The wound was sharply excisionally debrided with a 15 blade, followed by a misonix ultrasonic debrider. Debridement was performed to bleeding, viable wound base. The wound was debrided to the level of the deep fascial tissue. Following debridement the wound measured 5x3x0.5. The 4th metatarsal was exposed in the wound bed, but appeared healthy and viable with normal  color.   Attention was then directed to the right great toe. There was distal necrosis that extended to the bone. For this reason partial amputation was indicated. A fishmouth incision was made proximal to the necrotic area. The toe was disarticulated at the interphalangeal joint. Healthy viable bleeding was noted proximal to the wound. The wound was closed with 3-0 nylon.  The right 4th toe was noted to have healthy tissue underneath the eschar. As it appeared viable, amputation was not indicated.  The wounds were finally irrigated. The foot was then dressed with betadine 4x4, kerlix, and ACE bandage. Patient tolerated the procedure well.   Disposition: Following a period of post-operative monitoring, patient will be transferred back home. Hopefully the 3rd toe will continue to heal. I did discuss with his wife post-operatively my concern about the 4th metatarsal. We discussed should infection be noted at this area he would likely need a transmetatarsal amputation. Will continue to monitor for necessity of this, and continue to monitor the viability of the 3rd and remaining 1st digit.

## 2021-02-12 NOTE — Interval H&P Note (Signed)
History and Physical Interval Note:  02/12/2021 12:59 PM  Richard Pittman  has presented today for surgery, with the diagnosis of DM foot ulcer, gangrene.  The various methods of treatment have been discussed with the patient and family. After consideration of risks, benefits and other options for treatment, the patient has consented to  Procedure(s): DEBRIDEMENT WOUND (Right) AMPUTATION TOE;Possible great toe and 3rd toe amputation as indicated (Right) as a surgical intervention.  The patient's history has been reviewed, patient examined, no change in status, stable for surgery.  I have reviewed the patient's chart and labs.  Questions were answered to the patient's satisfaction.     Park Liter

## 2021-02-12 NOTE — Transfer of Care (Signed)
Immediate Anesthesia Transfer of Care Note  Patient: Richard Pittman  Procedure(s) Performed: DEBRIDEMENT WOUND (Right) PARTIAL AMPUTATION GREAT TOE (Right: Toe)  Patient Location: PACU  Anesthesia Type:MAC  Level of Consciousness: awake, alert  and oriented  Airway & Oxygen Therapy: Patient Spontanous Breathing and Patient connected to face mask oxygen  Post-op Assessment: Report given to RN and Post -op Vital signs reviewed and stable  Post vital signs: Reviewed and stable  Last Vitals:  Vitals Value Taken Time  BP    Temp    Pulse 79 02/12/21 1441  Resp 12 02/12/21 1441  SpO2 99 % 02/12/21 1441  Vitals shown include unvalidated device data.  Last Pain:  Vitals:   02/12/21 1218  TempSrc: Oral  PainSc: 0-No pain         Complications: No notable events documented.

## 2021-02-12 NOTE — Discharge Instructions (Addendum)
°  After Surgery Instructions   1) If you are recuperating from surgery anywhere other than home, please be sure to leave [place] the number where you can be reached.  2) Go directly home and rest.  3) Keep the operated foot(feet) elevated six inches above the hip when sitting or lying down. This will help control swelling and pain.  4) Support the elevated foot and leg with pillows. DO NOT PLACE PILLOWS UNDER THE KNEE.  5) DO NOT REMOVE or get your bandages WET, unless you were given different instructions by your doctor to do so. This increases the risk of infection. You may remove them after 48 hours and change daily with the povidone ointment like you were doing.  6) Wear your surgical shoe or surgical boot at all times when you are up on your feet.  7) A limited amount of pain and swelling may occur. The skin may take on a bruised appearance. DO NOT BE ALARMED, THIS IS NORMAL.  8) For slight pain and swelling, apply an ice pack directly over the bandages for 15 minutes only out of each hour of the day. Continue until seen in the office for your first post op visit. DO NOT APPLY ANY FORM OF HEAT TO THE AREA.  9) Have prescriptions filled immediately and take as directed.  10) Drink lots of liquids, water and juice to stay hydrated.  11) CALL IMMEDIATELY IF:  *Bleeding continues until the following day of surgery  *Pain increases and/or does not respond to medication  *Bandages or cast appears to tight  *If your bandage gets wet  *Trip, fall or stump your surgical foot  *If your temperature goes above 101  *If you have ANY questions at all  12) You are expected to be weightbearing after your surgery.   If you need to reach the nurse for any reason, please call: [place]: [phone] [place]: [phone] [place]: [phone]   Post Anesthesia Home Care Instructions  Activity: Get plenty of rest for the remainder of the day. A responsible individual  must stay with you for 24 hours following the procedure.  For the next 24 hours, DO NOT: -Drive a car -Advertising copywriter -Drink alcoholic beverages -Take any medication unless instructed by your physician -Make any legal decisions or sign important papers.  Meals: Start with liquid foods such as gelatin or soup. Progress to regular foods as tolerated. Avoid greasy, spicy, heavy foods. If nausea and/or vomiting occur, drink only clear liquids until the nausea and/or vomiting subsides. Call your physician if vomiting continues.  Special Instructions/Symptoms: Your throat may feel dry or sore from the anesthesia or the breathing tube placed in your throat during surgery. If this causes discomfort, gargle with warm salt water. The discomfort should disappear within 24 hours.

## 2021-02-12 NOTE — Anesthesia Preprocedure Evaluation (Signed)
Anesthesia Evaluation  Patient identified by MRN, date of birth, ID band Patient awake    Reviewed: Allergy & Precautions, NPO status , Patient's Chart, lab work & pertinent test results  History of Anesthesia Complications Negative for: history of anesthetic complications  Airway Mallampati: II  TM Distance: >3 FB Neck ROM: Full    Dental  (+) Teeth Intact, Dental Advisory Given   Pulmonary neg pulmonary ROS,    Pulmonary exam normal        Cardiovascular negative cardio ROS Normal cardiovascular exam     Neuro/Psych negative neurological ROS     GI/Hepatic negative GI ROS, Neg liver ROS,   Endo/Other  diabetes, Poorly Controlled, Type 2, Oral Hypoglycemic Agents  Renal/GU negative Renal ROS  negative genitourinary   Musculoskeletal negative musculoskeletal ROS (+)   Abdominal   Peds  Hematology  (+) anemia ,   Anesthesia Other Findings   Reproductive/Obstetrics                             Anesthesia Physical Anesthesia Plan  ASA: 3  Anesthesia Plan: MAC   Post-op Pain Management: Tylenol PO (pre-op) and Toradol IV (intra-op)   Induction: Intravenous  PONV Risk Score and Plan: 1 and Propofol infusion, TIVA and Treatment may vary due to age or medical condition  Airway Management Planned: Natural Airway, Nasal Cannula and Simple Face Mask  Additional Equipment: None  Intra-op Plan:   Post-operative Plan:   Informed Consent: I have reviewed the patients History and Physical, chart, labs and discussed the procedure including the risks, benefits and alternatives for the proposed anesthesia with the patient or authorized representative who has indicated his/her understanding and acceptance.       Plan Discussed with:   Anesthesia Plan Comments:         Anesthesia Quick Evaluation

## 2021-02-12 NOTE — Brief Op Note (Signed)
02/12/2021  2:50 PM  PATIENT:  Richard Pittman  50 y.o. male  PRE-OPERATIVE DIAGNOSIS:  DM foot ulcer, gangrene  POST-OPERATIVE DIAGNOSIS:  DM foot ulcer, gangrene  PROCEDURE:  Procedure(s): DEBRIDEMENT WOUND (Right) PARTIAL AMPUTATION GREAT TOE (Right)  SURGEON:  Surgeon(s) and Role:    * Evelina Bucy, DPM - Primary  PHYSICIAN ASSISTANT:   ASSISTANTS: none   ANESTHESIA:   local and MAC  EBL:  5 mL   BLOOD ADMINISTERED:none  DRAINS: none   LOCAL MEDICATIONS USED:  MARCAINE    and Amount: 10 ml  SPECIMEN:  ID Type Source Tests Collected by Time Destination  1 : PARTIAL AMPUTATION RIGHT GREAT TOE Tissue PATH Soft tissue SURGICAL PATHOLOGY Evelina Bucy, DPM 02/12/2021 1423      DISPOSITION OF SPECIMEN:  PATHOLOGY  COUNTS:  YES  TOURNIQUET:  * No tourniquets in log *  DICTATION: .Dragon Dictation  PLAN OF CARE: Discharge to home after PACU  PATIENT DISPOSITION:  PACU - hemodynamically stable.   Delay start of Pharmacological VTE agent (>24hrs) due to surgical blood loss or risk of bleeding: not applicable

## 2021-02-12 NOTE — Anesthesia Postprocedure Evaluation (Signed)
Anesthesia Post Note  Patient: Richard Pittman  Procedure(s) Performed: DEBRIDEMENT WOUND (Right) PARTIAL AMPUTATION GREAT TOE (Right: Toe)     Patient location during evaluation: PACU Anesthesia Type: MAC Level of consciousness: awake and alert Pain management: pain level controlled Vital Signs Assessment: post-procedure vital signs reviewed and stable Respiratory status: spontaneous breathing, nonlabored ventilation and respiratory function stable Cardiovascular status: blood pressure returned to baseline and stable Postop Assessment: no apparent nausea or vomiting Anesthetic complications: no   No notable events documented.  Last Vitals:  Vitals:   02/12/21 1500 02/12/21 1515  BP: (!) 147/81 (!) 161/83  Pulse: 79 77  Resp: 13 17  Temp:  36.6 C  SpO2: 96% 95%    Last Pain:  Vitals:   02/12/21 1515  TempSrc:   PainSc: 0-No pain        RLE Motor Response: Purposeful movement (02/12/21 1515) RLE Sensation: Decreased;No pain (02/12/21 1515)      Lidia Collum

## 2021-02-13 ENCOUNTER — Encounter (HOSPITAL_BASED_OUTPATIENT_CLINIC_OR_DEPARTMENT_OTHER): Payer: Self-pay | Admitting: Podiatry

## 2021-02-14 LAB — SURGICAL PATHOLOGY

## 2021-02-18 ENCOUNTER — Ambulatory Visit (INDEPENDENT_AMBULATORY_CARE_PROVIDER_SITE_OTHER): Payer: Self-pay | Admitting: Podiatry

## 2021-02-18 ENCOUNTER — Other Ambulatory Visit: Payer: Self-pay

## 2021-02-18 DIAGNOSIS — E11621 Type 2 diabetes mellitus with foot ulcer: Secondary | ICD-10-CM

## 2021-02-18 DIAGNOSIS — I96 Gangrene, not elsewhere classified: Secondary | ICD-10-CM

## 2021-02-18 DIAGNOSIS — L97413 Non-pressure chronic ulcer of right heel and midfoot with necrosis of muscle: Secondary | ICD-10-CM

## 2021-02-18 DIAGNOSIS — Z9889 Other specified postprocedural states: Secondary | ICD-10-CM

## 2021-02-18 NOTE — Progress Notes (Signed)
°  Subjective:  Patient ID: Richard Pittman, male    DOB: 02-02-1971,  MRN: [id number]  Chief Complaint  Patient presents with   Routine Post Op    POV #1 DOS 02/12/2021 DEBRDEMENT RIGHT FOOT WOUND, PARTIAL HALLUX AMPUTATION   DOS: 01/08/21 Procedure: I and D Right foot  DOS: 01/10/21 Procedure: D and I Right foot, Right 4th toe amputation  51 y.o. male presents with the above complaint. History confirmed with patient. Started the daily dressing changes as directed. Denies new pedal issues today.  Objective:  Physical Exam: tenderness at the surgical site, local edema noted, and calf supple, nontender. Palpable pedal pulses Wound: large plantar 2nd-4th MPJ wound measuring 5*3 with mostly granular base. Small amount of exposed 4th metatarsal bone. Serous drainage noted only. Hallux amputation site healing well. 3rd toe with only small eschar plantar distal aspect though this is intimate to the nail area. Sensation intact to this digit. No warmth, erythema, ascending cellulitis or lymphangitis.  Assessment:   1. Diabetic ulcer of right midfoot associated with type 2 diabetes mellitus, with necrosis of muscle (HCC)   2. Gangrene of toe of right foot (HCC)   3. Post-operative state     Plan:  Patient was evaluated and treated and all questions answered.  Post-operative State -Wound continues to improve. There was still a small amount of exposed fourth metatarsal bone distally today.  -Continue povidone ointment and bandaging daily. Dressed with medihoney and DSD today. I am hopeful that the wound will continue to heal and we can prevent a transmetarsal amputation but this is likely if the 4th metatarsal becomes infected. He may benefit from a TMA regardless for stability, however will continue with local wound care as this is improving.   -XRs needed at follow-up: none  F/u in 1 week for recheck.

## 2021-02-25 ENCOUNTER — Other Ambulatory Visit: Payer: Self-pay

## 2021-02-25 ENCOUNTER — Ambulatory Visit (INDEPENDENT_AMBULATORY_CARE_PROVIDER_SITE_OTHER): Payer: Self-pay | Admitting: Podiatry

## 2021-02-25 ENCOUNTER — Ambulatory Visit: Payer: Self-pay

## 2021-02-25 DIAGNOSIS — Z9889 Other specified postprocedural states: Secondary | ICD-10-CM

## 2021-02-25 NOTE — Progress Notes (Signed)
°  Subjective:  Patient ID: Richard Pittman, male    DOB: October 07, 1970,  MRN: [id number]  Chief Complaint  Patient presents with   Routine Post Op    WOUND CARE POV #2 DOS 02/12/2021 DEBRDEMENT & IRRIGATION OF RT FOOT WOUND INC AMPUTATION OF WHOLE OR PARTS OF GREAT & 3RD TOE AS INDICATED   DOS: 01/08/21 Procedure: I and D Right foot  DOS: 01/10/21 Procedure: D and I Right foot, Right 4th toe amputation  50 y.o. male presents with the above complaint. History confirmed with patient. Started the daily dressing changes as directed. Denies new pedal issues today.  Doing well he is complete antibiotics pain only rated 2 out of 10.  Oral temp 98.2  Objective:  Physical Exam: tenderness at the surgical site, local edema noted, and calf supple, nontender. Palpable pedal pulses Wound: large plantar 2nd-4th MPJ wound measuring 4.5*2.5 with mostly granular base. Small amount of exposed 4th metatarsal bone. Serous drainage noted only. Hallux amputation site healing well. 3rd toe with only small eschar plantar distal aspect though this is intimate to the nail area. Sensation intact to this digit. No warmth, erythema, ascending cellulitis or lymphangitis.  Assessment:   1. Status post right foot surgery     Plan:  Patient was evaluated and treated and all questions answered.  Post-operative State -Wound continues to improve.  There is more coverage of the bone today this was not visible in the wound bed.  The wound remains healthy and red.  There is still some eschar to the third toe which was not debrided.  Dressed with Medihoney and dry sterile dressing today.  Continue dressing with povidone and dry dressing daily.  We will have patient follow-up in 2 weeks for recheck.  Educated on signs of infection and to call sooner should this occur -XRs needed at follow-up: none  F/u in 1 week for recheck.

## 2021-03-04 ENCOUNTER — Encounter: Payer: Self-pay | Admitting: Podiatry

## 2021-03-11 ENCOUNTER — Ambulatory Visit (INDEPENDENT_AMBULATORY_CARE_PROVIDER_SITE_OTHER): Payer: Self-pay | Admitting: Podiatry

## 2021-03-11 ENCOUNTER — Other Ambulatory Visit: Payer: Self-pay

## 2021-03-11 DIAGNOSIS — Z9889 Other specified postprocedural states: Secondary | ICD-10-CM

## 2021-03-11 DIAGNOSIS — E11621 Type 2 diabetes mellitus with foot ulcer: Secondary | ICD-10-CM

## 2021-03-11 DIAGNOSIS — L97413 Non-pressure chronic ulcer of right heel and midfoot with necrosis of muscle: Secondary | ICD-10-CM

## 2021-03-11 NOTE — Progress Notes (Signed)
°  Subjective:  Patient ID: Richard Pittman, male    DOB: 06-05-1970,  MRN: [id number]  Chief Complaint  Patient presents with   Routine Post Op     POV #3 DOS 02/12/2021 DEBRDEMENT & IRRIGATION OF RT FOOT WOUND INC AMPUTATION OF WHOLE OR PARTS OF GREAT & 3RD TOE AS INDICATED   DOS: 01/08/21 Procedure: I and D Right foot  DOS: 01/10/21 Procedure: D and I Right foot, Right 4th toe amputation  51 y.o. male presents with the above complaint. History confirmed with patient.  States that the foot wound is doing rather well he is changing daily with povidone as directed.  Endorses pain at 4 out of 10 today.  Denies nausea vomiting fever chills chest pains and shortness of breath no other issues or complaints  Objective:  Physical Exam: tenderness at the surgical site, local edema noted, and calf supple, nontender. Palpable pedal pulses Wound: large plantar 2nd-4th MPJ wound measuring 4.5*2.5 with mostly granular base. No exposed 4th metatarsal bone. Serous drainage noted only. Hallux amputation site healing well with intact suture material. 3rd toe with only small eschar plantar distal aspect though this is intimate to the nail area. Sensation intact to this digit. No warmth, erythema, ascending cellulitis or lymphangitis.  Assessment:   1. Status post right foot surgery   2. Diabetic ulcer of right midfoot associated with type 2 diabetes mellitus, with necrosis of muscle (HCC)      Plan:  Patient was evaluated and treated and all questions answered.  Post-operative State -Wound continues to improve slowly.  While it is the same size overall it appears to be less deep and she he has full coverage of the fourth metatarsal.  I think the wound is a little dry and thus we will stop the povidone and start wet-to-dry saline dressing instead.  Patient and his wife were educated on how to do this.  We will follow-up in 2 to 3 weeks plan for suture removal of the great toe at this  time. -XRs needed at follow-up: none  F/u in 1 week for recheck.

## 2021-03-25 ENCOUNTER — Ambulatory Visit (INDEPENDENT_AMBULATORY_CARE_PROVIDER_SITE_OTHER): Payer: Self-pay | Admitting: Podiatry

## 2021-03-25 ENCOUNTER — Other Ambulatory Visit: Payer: Self-pay

## 2021-03-25 DIAGNOSIS — E11621 Type 2 diabetes mellitus with foot ulcer: Secondary | ICD-10-CM

## 2021-03-25 DIAGNOSIS — I96 Gangrene, not elsewhere classified: Secondary | ICD-10-CM

## 2021-03-25 DIAGNOSIS — L97413 Non-pressure chronic ulcer of right heel and midfoot with necrosis of muscle: Secondary | ICD-10-CM

## 2021-03-25 DIAGNOSIS — Z9889 Other specified postprocedural states: Secondary | ICD-10-CM

## 2021-03-25 NOTE — Progress Notes (Signed)
°  Subjective:  Patient ID: Richard Pittman, male    DOB: 1970-03-03,  MRN: [id number]  Chief Complaint  Patient presents with   Routine Post Op     POV #4 DOS 02/12/2021 DEBRDEMENT & IRRIGATION OF RT FOOT WOUND INC AMPUTATION OF WHOLE OR PARTS OF GREAT & 3RD TOE AS INDICATED   DOS: 01/08/21 Procedure: I and D Right foot  DOS: 01/10/21 Procedure: D and I Right foot, Right 4th toe amputation  51 y.o. male presents with the above complaint. History confirmed with patient. Has been doing WTD dressings and noting improvement. Denies acute changes. Denies post-op issues or concerns. Objective:  Physical Exam: tenderness at the surgical site, local edema noted, and calf supple, nontender. Palpable pedal pulses Wound: large plantar 2nd-4th MPJ wound measuring 3.5*2.5 with mostly granular base. No exposed 4th metatarsal bone. Serous drainage noted only. Hallux amputation site healing with overlying soft eschar. Not removed. 3rd toe with continued small eschar plantar distal without warmth, erythema, SOI. Sensation intact to this digit. No warmth, erythema, ascending cellulitis or lymphangitis.  Assessment:   1. Status post right foot surgery   2. Diabetic ulcer of right midfoot associated with type 2 diabetes mellitus, with necrosis of muscle (HCC)   3. Gangrene of toe of right foot (HCC)    Plan:  Patient was evaluated and treated and all questions answered.  Post-operative State -Wound continues to improve  -Continue WTD dressings daily. -Sutures removed from the hallux area. -Discussed if wound remains stalled we could consider debridement to promote healing.  F/u in 1 week for recheck.

## 2021-03-31 ENCOUNTER — Other Ambulatory Visit: Payer: Self-pay

## 2021-03-31 ENCOUNTER — Encounter: Payer: Self-pay | Admitting: Podiatry

## 2021-03-31 ENCOUNTER — Ambulatory Visit (INDEPENDENT_AMBULATORY_CARE_PROVIDER_SITE_OTHER): Payer: Self-pay | Admitting: Podiatry

## 2021-03-31 DIAGNOSIS — Z9889 Other specified postprocedural states: Secondary | ICD-10-CM

## 2021-03-31 DIAGNOSIS — I96 Gangrene, not elsewhere classified: Secondary | ICD-10-CM

## 2021-03-31 DIAGNOSIS — L97413 Non-pressure chronic ulcer of right heel and midfoot with necrosis of muscle: Secondary | ICD-10-CM

## 2021-03-31 DIAGNOSIS — E11621 Type 2 diabetes mellitus with foot ulcer: Secondary | ICD-10-CM

## 2021-03-31 MED ORDER — DOXYCYCLINE HYCLATE 100 MG PO TABS: 100.0000 mg | ORAL_TABLET | Freq: Two times a day (BID) | ORAL | 0 refills | Status: AC

## 2021-03-31 NOTE — Progress Notes (Signed)
°  Subjective:  Patient ID: Richard Pittman, male    DOB: [birth year],  MRN: [id number]  Chief Complaint  Patient presents with   Post-op Problem    Right foot post op problem Drainage coming from incision site    DOS: 01/08/21 Procedure: I and D Right foot  DOS: 01/10/21 Procedure: D and I Right foot, Right 4th toe amputation  51 y.o. male presents with the above complaint. History confirmed with patient. Has been doing WTD dressings . Relates since having sutures removed started having clear drainage that turned to pus and now area looks infected.    Objective:  Physical Exam: tenderness at the surgical site, local edema noted, and calf supple, nontender. Palpable pedal pulses Wound: large plantar 2nd-4th MPJ wound measuring 3.5*2.5 with mostly granular and fibrotic base. No exposed 4th metatarsal bone. Serous drainage noted only. Hallux amputation site dehisced with overlying eschar that was debrided with underlying fibrosis and purulence noted Does not probe to bone. Mild surrounding erythema noted. . 3rd toe with continued small eschar plantar distal without warmth, erythema, SOI. Sensation intact to this digit. No warmth, erythema, ascending cellulitis or lymphangitis.  Assessment:   1. Status post right foot surgery   2. Diabetic ulcer of right midfoot associated with type 2 diabetes mellitus, with necrosis of muscle (HCC)   3. Gangrene of toe of right foot (HCC)     Plan:  Patient was evaluated and treated and all questions answered.  Post-operative State -Hallux wound with dehiscence and cellulitis noted and small amount of purulence. Wound debrided and irrigated.  -Continue WTD dressings daily. -Doxycycline sent to pharmacy.  -Continue offloading in CAM boot.  - Patient to follow-up in one week with Dr. [name].   F/u in 1 week for recheck.

## 2021-04-01 ENCOUNTER — Encounter: Payer: Self-pay | Admitting: Podiatry

## 2021-04-01 ENCOUNTER — Ambulatory Visit (INDEPENDENT_AMBULATORY_CARE_PROVIDER_SITE_OTHER): Payer: Self-pay | Admitting: Podiatry

## 2021-04-01 VITALS — Temp 97.7°F

## 2021-04-01 DIAGNOSIS — L97413 Non-pressure chronic ulcer of right heel and midfoot with necrosis of muscle: Secondary | ICD-10-CM

## 2021-04-01 DIAGNOSIS — E11621 Type 2 diabetes mellitus with foot ulcer: Secondary | ICD-10-CM

## 2021-04-01 DIAGNOSIS — Z9889 Other specified postprocedural states: Secondary | ICD-10-CM

## 2021-04-01 NOTE — Progress Notes (Signed)
°  Subjective:  Patient ID: Richard Pittman, male    DOB: 13-Dec-1970,  MRN: [id number]  Chief Complaint  Patient presents with   Routine Post Op    There was a blister that popped up on the big toe right foot and it does look better today and just wanted to come in to check to see if it was ok    DOS: 01/08/21 Procedure: I and D Right foot  DOS: 01/10/21 Procedure: D and I Right foot, Right 4th toe amputation  51 y.o. male presents with the above complaint. History confirmed with patient. Was concerned and noticed "pus" at the big toe area after the sutures were removed. Thinks the wound is looking better today, however. Objective:  Physical Exam: tenderness at the surgical site, local edema noted, and calf supple, nontender. Palpable pedal pulses Wound: large plantar 2nd-4th MPJ wound measuring 3.5*2 with mostly granular base. No exposed 4th metatarsal bone. Serous drainage noted only. Hallux amputation site healing with overlying fibrotic tissue. 3rd toe with continued small eschar plantar distal but with granulating tissue underneath. No warmth, erythema, SOI. Sensation intact to this digit. No warmth, erythema, ascending cellulitis or lymphangitis.  Assessment:   1. Status post right foot surgery   2. Diabetic ulcer of right midfoot associated with type 2 diabetes mellitus, with necrosis of muscle (HCC)    Plan:  Patient was evaluated and treated and all questions answered.  Post-operative State -Wound continues to improve. I think some of the more fibrotic healing tissue was confused with pus. There was no purulence today. I will have him continue the abx as he has already started it -Medihoney daily to the yellow / fibrotic areas, then can continue WTD dressings daily. Medihoney and dressing applied today. -If wound remains stalled we could consider debridement to promote healing. -Surveillance XR next visit.  F/u in 1 week for recheck.

## 2021-04-04 ENCOUNTER — Other Ambulatory Visit: Payer: Self-pay

## 2021-04-08 ENCOUNTER — Encounter: Payer: Self-pay | Admitting: Podiatry

## 2021-04-09 ENCOUNTER — Encounter: Payer: Self-pay | Admitting: Podiatry

## 2021-04-10 ENCOUNTER — Encounter: Payer: Self-pay | Admitting: Podiatry

## 2021-04-10 ENCOUNTER — Other Ambulatory Visit: Payer: Self-pay

## 2021-04-10 ENCOUNTER — Ambulatory Visit (INDEPENDENT_AMBULATORY_CARE_PROVIDER_SITE_OTHER): Payer: Self-pay

## 2021-04-10 ENCOUNTER — Ambulatory Visit (INDEPENDENT_AMBULATORY_CARE_PROVIDER_SITE_OTHER): Payer: Self-pay | Admitting: Podiatry

## 2021-04-10 DIAGNOSIS — Z9889 Other specified postprocedural states: Secondary | ICD-10-CM

## 2021-04-11 NOTE — Progress Notes (Signed)
Subjective:   Patient ID: Richard Pittman, male   DOB: 51 y.o.   MRN: [id number]   HPI Patient presents with interpreter stating that he seems to be doing pretty well after having a bone resection of the right foot by Dr. [name] over the last few months.  States they are doing home care for this and that he is finishing his antibiotic   ROS      Objective:  Physical Exam  Neurovascular status is mildly diminished but appears to be intact from previous visits with patient having had metatarsal resection right and partial hallux resection right with the area on the plantar aspect of the right metatarsal region which measures approximately 3.5 cm x 2 cm but appears to be granulating in with no bone no tendon exposure at the current time.  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 Ralene Cork who saw him during his postoperative period for further care and ultimately may need further bone resection which the patient understands as does the interpreter who provided this information to him and caregiver.  X-rays indicate that the bone structure currently looks good I did not see osteolysis in the metatarsal heads or the hallux currently

## 2021-04-15 DIAGNOSIS — H40033 Anatomical narrow angle, bilateral: Secondary | ICD-10-CM | POA: Diagnosis not present

## 2021-04-15 DIAGNOSIS — E113523 Type 2 diabetes mellitus with proliferative diabetic retinopathy with traction retinal detachment involving the macula, bilateral: Secondary | ICD-10-CM | POA: Diagnosis not present

## 2021-04-15 DIAGNOSIS — Z9889 Other specified postprocedural states: Secondary | ICD-10-CM | POA: Diagnosis not present

## 2021-04-15 DIAGNOSIS — H26491 Other secondary cataract, right eye: Secondary | ICD-10-CM | POA: Diagnosis not present

## 2021-04-21 ENCOUNTER — Encounter (HOSPITAL_COMMUNITY): Payer: Self-pay

## 2021-04-21 ENCOUNTER — Emergency Department (HOSPITAL_COMMUNITY): Payer: 59

## 2021-04-21 ENCOUNTER — Inpatient Hospital Stay (HOSPITAL_COMMUNITY)
Admission: EM | Admit: 2021-04-21 | Discharge: 2021-04-24 | DRG: 617 | Disposition: A | Discharge status: Home / Self Care | Payer: 59 | Attending: Internal Medicine | Admitting: Internal Medicine

## 2021-04-21 ENCOUNTER — Ambulatory Visit (INDEPENDENT_AMBULATORY_CARE_PROVIDER_SITE_OTHER): Payer: Self-pay | Admitting: Podiatry

## 2021-04-21 ENCOUNTER — Other Ambulatory Visit: Payer: Self-pay

## 2021-04-21 DIAGNOSIS — Z20822 Contact with and (suspected) exposure to covid-19: Secondary | ICD-10-CM | POA: Diagnosis not present

## 2021-04-21 DIAGNOSIS — Z89411 Acquired absence of right great toe: Secondary | ICD-10-CM | POA: Diagnosis not present

## 2021-04-21 DIAGNOSIS — Z833 Family history of diabetes mellitus: Secondary | ICD-10-CM

## 2021-04-21 DIAGNOSIS — Z89431 Acquired absence of right foot: Secondary | ICD-10-CM

## 2021-04-21 DIAGNOSIS — Z89421 Acquired absence of other right toe(s): Secondary | ICD-10-CM | POA: Diagnosis not present

## 2021-04-21 DIAGNOSIS — M869 Osteomyelitis, unspecified: Secondary | ICD-10-CM

## 2021-04-21 DIAGNOSIS — E1165 Type 2 diabetes mellitus with hyperglycemia: Secondary | ICD-10-CM | POA: Diagnosis not present

## 2021-04-21 DIAGNOSIS — M86171 Other acute osteomyelitis, right ankle and foot: Secondary | ICD-10-CM | POA: Diagnosis not present

## 2021-04-21 DIAGNOSIS — Z79899 Other long term (current) drug therapy: Secondary | ICD-10-CM

## 2021-04-21 DIAGNOSIS — Z7984 Long term (current) use of oral hypoglycemic drugs: Secondary | ICD-10-CM

## 2021-04-21 DIAGNOSIS — L97509 Non-pressure chronic ulcer of other part of unspecified foot with unspecified severity: Secondary | ICD-10-CM | POA: Diagnosis not present

## 2021-04-21 DIAGNOSIS — Z9842 Cataract extraction status, left eye: Secondary | ICD-10-CM

## 2021-04-21 DIAGNOSIS — Z9841 Cataract extraction status, right eye: Secondary | ICD-10-CM

## 2021-04-21 DIAGNOSIS — Z6830 Body mass index (BMI) 30.0-30.9, adult: Secondary | ICD-10-CM

## 2021-04-21 DIAGNOSIS — Z09 Encounter for follow-up examination after completed treatment for conditions other than malignant neoplasm: Secondary | ICD-10-CM

## 2021-04-21 DIAGNOSIS — B999 Unspecified infectious disease: Secondary | ICD-10-CM | POA: Diagnosis present

## 2021-04-21 DIAGNOSIS — M868X7 Other osteomyelitis, ankle and foot: Secondary | ICD-10-CM | POA: Diagnosis not present

## 2021-04-21 DIAGNOSIS — E11628 Type 2 diabetes mellitus with other skin complications: Secondary | ICD-10-CM

## 2021-04-21 DIAGNOSIS — L97519 Non-pressure chronic ulcer of other part of right foot with unspecified severity: Secondary | ICD-10-CM | POA: Diagnosis not present

## 2021-04-21 DIAGNOSIS — L02611 Cutaneous abscess of right foot: Secondary | ICD-10-CM | POA: Diagnosis not present

## 2021-04-21 DIAGNOSIS — I96 Gangrene, not elsewhere classified: Secondary | ICD-10-CM | POA: Diagnosis not present

## 2021-04-21 DIAGNOSIS — L03115 Cellulitis of right lower limb: Secondary | ICD-10-CM | POA: Diagnosis present

## 2021-04-21 DIAGNOSIS — M7989 Other specified soft tissue disorders: Secondary | ICD-10-CM | POA: Diagnosis not present

## 2021-04-21 DIAGNOSIS — E669 Obesity, unspecified: Secondary | ICD-10-CM | POA: Diagnosis present

## 2021-04-21 DIAGNOSIS — E11621 Type 2 diabetes mellitus with foot ulcer: Secondary | ICD-10-CM | POA: Diagnosis not present

## 2021-04-21 DIAGNOSIS — L089 Local infection of the skin and subcutaneous tissue, unspecified: Secondary | ICD-10-CM

## 2021-04-21 DIAGNOSIS — E875 Hyperkalemia: Secondary | ICD-10-CM | POA: Diagnosis not present

## 2021-04-21 DIAGNOSIS — S98911A Complete traumatic amputation of right foot, level unspecified, initial encounter: Secondary | ICD-10-CM | POA: Diagnosis not present

## 2021-04-21 DIAGNOSIS — E1151 Type 2 diabetes mellitus with diabetic peripheral angiopathy without gangrene: Secondary | ICD-10-CM | POA: Diagnosis not present

## 2021-04-21 DIAGNOSIS — S91101A Unspecified open wound of right great toe without damage to nail, initial encounter: Secondary | ICD-10-CM | POA: Diagnosis not present

## 2021-04-21 LAB — BASIC METABOLIC PANEL
Anion gap: 6 (ref 5–15)
BUN: 27 mg/dL — ABNORMAL HIGH (ref 6–20)
CO2: 26 mmol/L (ref 22–32)
Calcium: 9.3 mg/dL (ref 8.9–10.3)
Chloride: 105 mmol/L (ref 98–111)
Creatinine, Ser: 1.16 mg/dL (ref 0.61–1.24)
GFR, Estimated: >60 mL/min (ref >60)
Glucose, Bld: 215 mg/dL — ABNORMAL HIGH (ref 70–99)
Potassium: 5.2 mmol/L — ABNORMAL HIGH (ref 3.5–5.1)
Sodium: 137 mmol/L (ref 135–145)

## 2021-04-21 LAB — CBC WITH DIFFERENTIAL/PLATELET
Abs Immature Granulocytes: 0.02 10*3/uL (ref 0.00–0.07)
Basophils Absolute: 0.2 10*3/uL — ABNORMAL HIGH (ref 0.0–0.1)
Basophils Relative: 2 %
Eosinophils Absolute: 0.9 10*3/uL — ABNORMAL HIGH (ref 0.0–0.5)
Eosinophils Relative: 11 %
HCT: 43.4 % (ref 39.0–52.0)
Hemoglobin: 14.3 g/dL (ref 13.0–17.0)
Immature Granulocytes: 0 %
Lymphocytes Relative: 27 %
Lymphs Abs: 2.3 10*3/uL (ref 0.7–4.0)
MCH: 26.1 pg (ref 26.0–34.0)
MCHC: 32.9 g/dL (ref 30.0–36.0)
MCV: 79.2 fL — ABNORMAL LOW (ref 80.0–100.0)
Monocytes Absolute: 0.4 10*3/uL (ref 0.1–1.0)
Monocytes Relative: 4 %
Neutro Abs: 4.7 10*3/uL (ref 1.7–7.7)
Neutrophils Relative %: 56 %
Platelets: 178 10*3/uL (ref 150–400)
RBC: 5.48 MIL/uL (ref 4.22–5.81)
RDW: 13.6 % (ref 11.5–15.5)
WBC: 8.4 10*3/uL (ref 4.0–10.5)
nRBC: 0 % (ref 0.0–0.2)

## 2021-04-21 LAB — GLUCOSE, CAPILLARY
Glucose-Capillary: 148 mg/dL — ABNORMAL HIGH (ref 70–99)
Glucose-Capillary: 228 mg/dL — ABNORMAL HIGH (ref 70–99)

## 2021-04-21 LAB — SEDIMENTATION RATE: Sed Rate: 10 mm/hr (ref 0–16)

## 2021-04-21 LAB — CBG MONITORING, ED: Glucose-Capillary: 192 mg/dL — ABNORMAL HIGH (ref 70–99)

## 2021-04-21 LAB — C-REACTIVE PROTEIN: CRP: <0.5 mg/dL (ref <1.0)

## 2021-04-21 IMAGING — MR MR FOOT*R* WO/W CM
9 series · 39 of 40 positions shown · IV contrast (GADAVIST)
Comparison: Right foot x-rays from same day. MRI right foot dated
[DATE].

CLINICAL DATA: Chronic great toe pain.

EXAM:
MRI OF THE RIGHT FOREFOOT WITHOUT AND WITH CONTRAST
TECHNIQUE: Multiplanar, multisequence MR imaging of the right forefoot was
performed before and after the administration of intravenous
contrast.
CONTRAST:  9mL GADAVIST GADOBUTROL 1 MMOL/ML IV SOLN

[Series 3: T1 · coronal · right · 3.0mm · 0.47mm/px · 6 of 35 slices shown (1 of 2)]
[im 1/35]
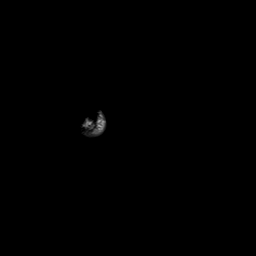
[im 7/35]
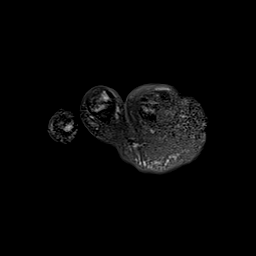
[im 14/35]
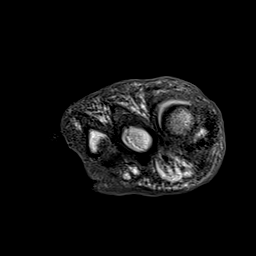
[im 21/35]
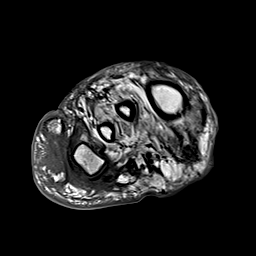
[im 28/35]
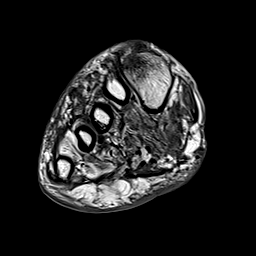
[im 35/35]
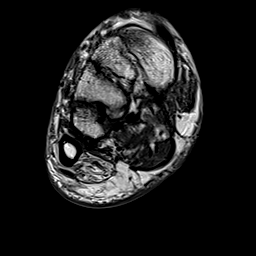

[Series 4: T2 fat-sat · coronal · right · 3.0mm · 0.38mm/px · 5 of 35 slices shown (1 of 2)]
[im 1/35]
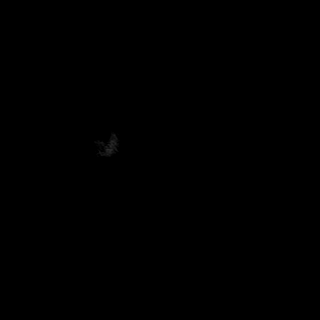
[im 9/35]
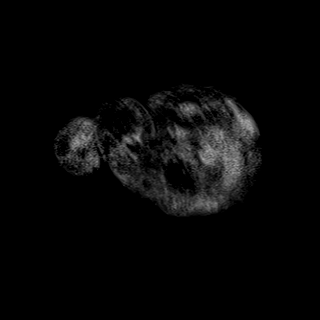
[im 18/35]
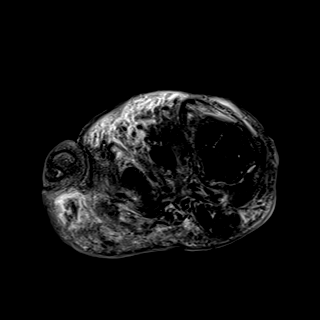
[im 26/35]
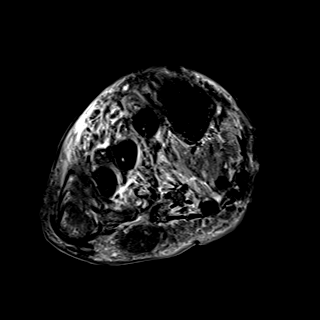
[im 35/35]
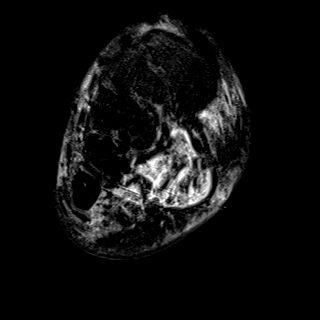

[Series 5: T2 fat-sat · axial · right · 3.0mm · 0.70mm/px · z∈[-103,-24]mm · 3 of 23 slices shown (2 of 2)]
[im 1/23]
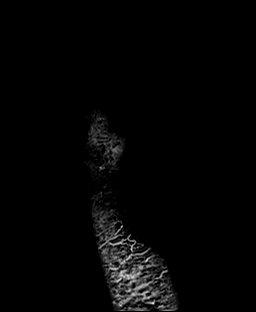
[im 12/23]
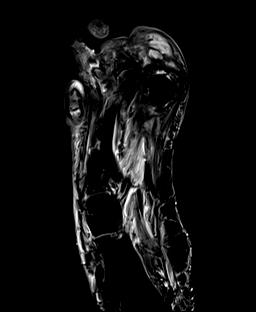
[im 23/23]
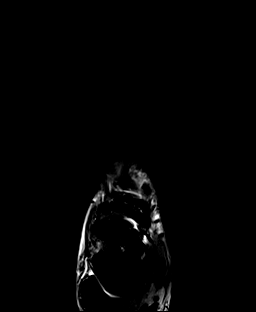

[Series 6: T1 · axial · right · 3.0mm · 0.70mm/px · z∈[-103,-24]mm · 4 of 24 slices shown (2 of 2)]
[im 1/24]
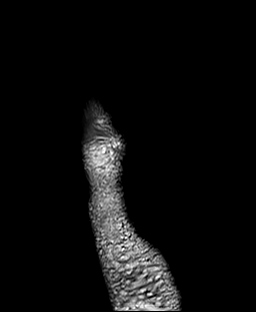
[im 8/24]
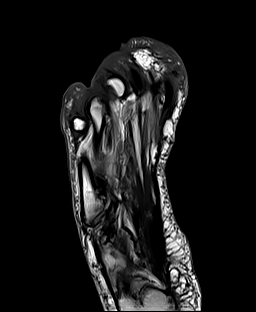
[im 16/24]
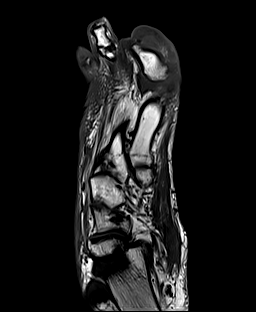
[im 24/24]
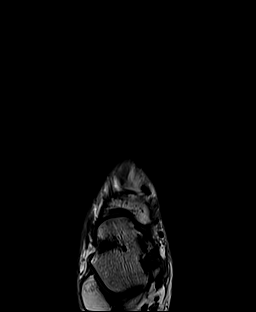

[Series 7: STIR · sagittal · right · 3.0mm · 0.35mm/px · 3 of 27 slices shown]
[im 1/27]
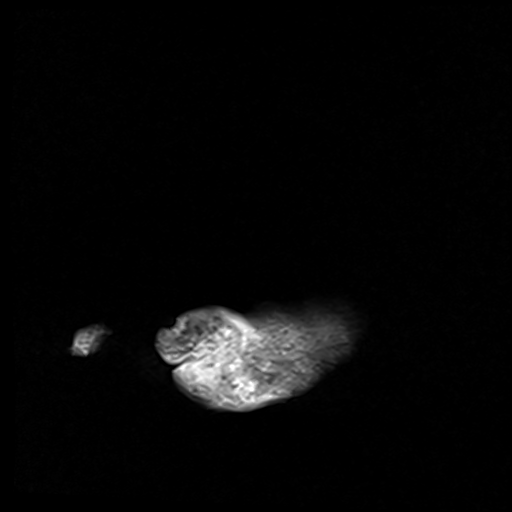
[im 9/27]
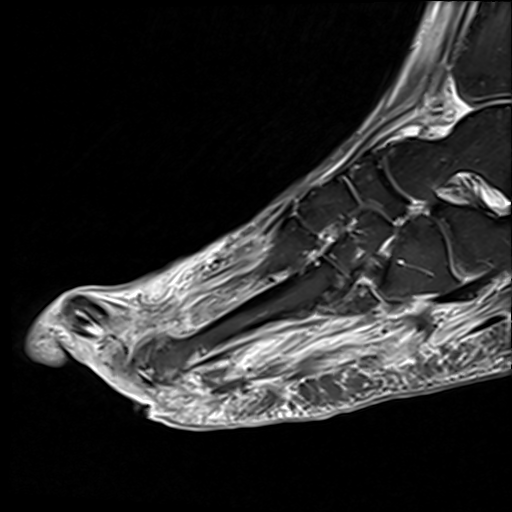
[im 18/27]
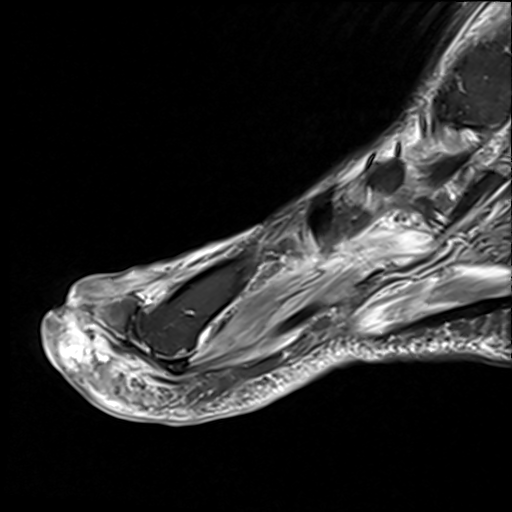

[Series 8: T1 fat-sat · coronal · non-contrast · right · 3.0mm · 0.47mm/px · 5 of 35 slices shown]
[im 1/35]
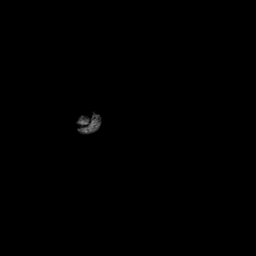
[im 9/35]
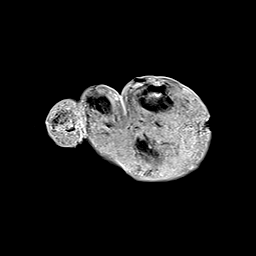
[im 18/35]
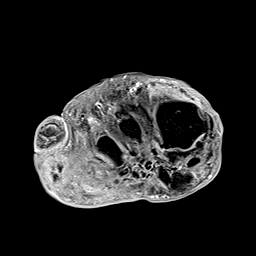
[im 26/35]
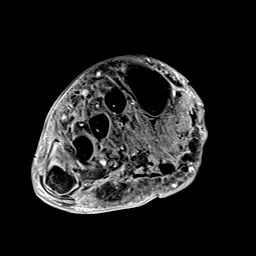
[im 35/35]
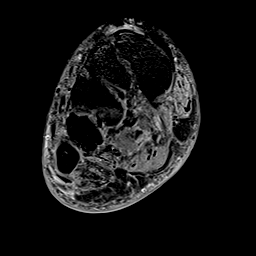

[Series 9: T1 fat-sat post-contrast · coronal · right · 3.0mm · 0.47mm/px · 5 of 35 slices shown (1 of 3)]
[im 1/35]
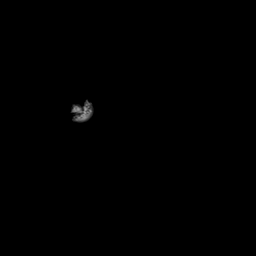
[im 9/35]
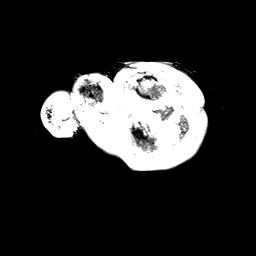
[im 18/35]
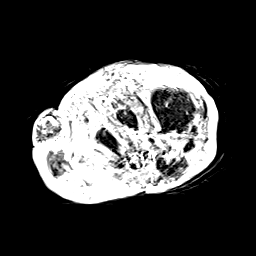
[im 26/35]
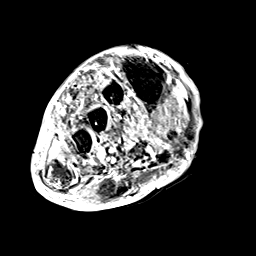
[im 35/35]
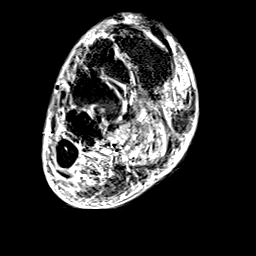

[Series 10: T1 fat-sat post-contrast · sagittal · right · 3.0mm · 0.35mm/px · 4 of 27 slices shown (2 of 3)]
[im 1/27]
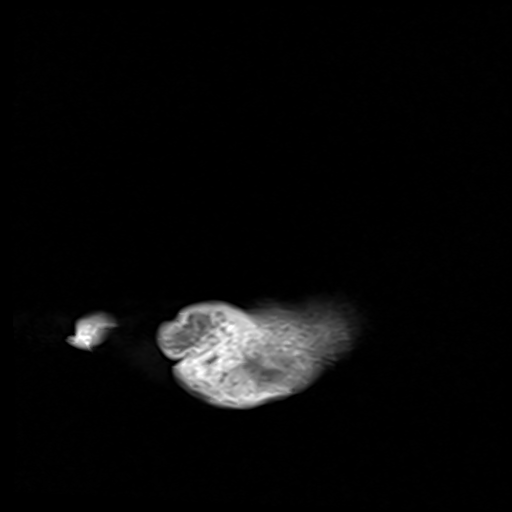
[im 9/27]
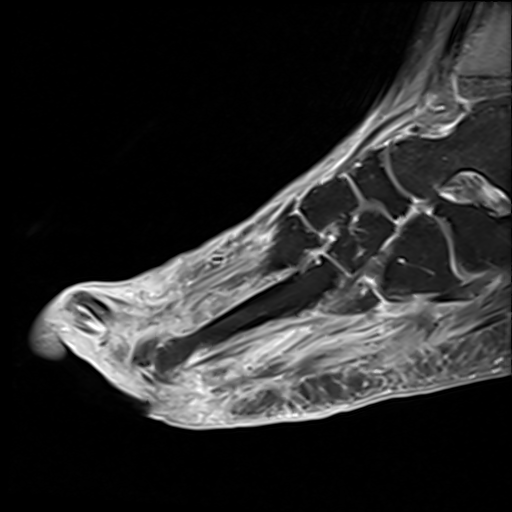
[im 18/27]
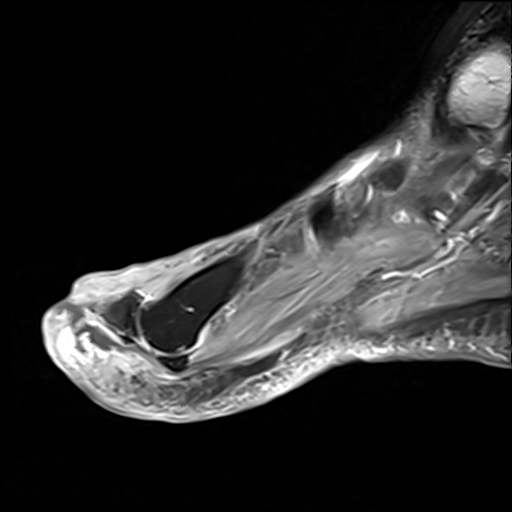
[im 27/27]
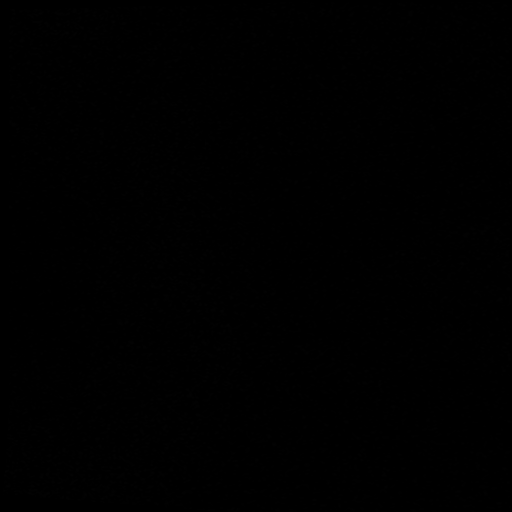

[Series 11: T1 fat-sat post-contrast · axial · right · 3.0mm · 0.56mm/px · z∈[-98,-19]mm · 4 of 24 slices shown (3 of 3)]
[im 1/24]
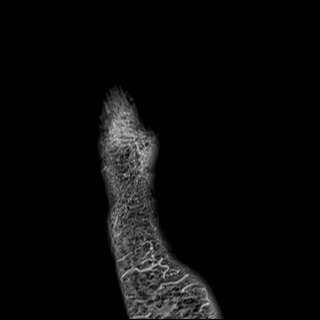
[im 8/24]
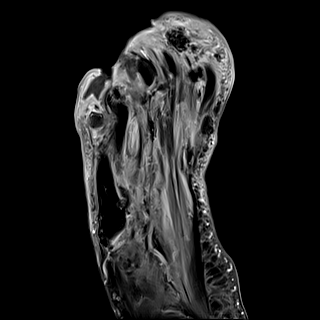
[im 16/24]
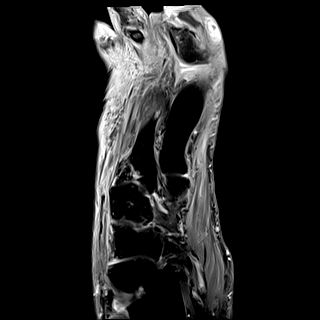
[im 24/24]
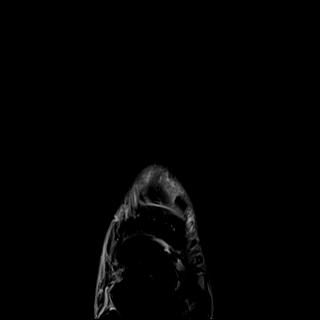

[39 of 40 positions shown; findings below may reference images not displayed]

FINDINGS: Bones/Joint/Cartilage

Prior partial amputation of the great toe with marrow edema,
cortical irregularity, and early decreased T1 marrow signal
involving the residual first proximal phalanx. There is also
abnormal marrow edema with corresponding patchy decreased T1 marrow
signal involving the third phalanges. Mild marrow edema in the
fourth and fifth metatarsal heads. Prior amputation of the fourth
toe.

No fracture or dislocation. Joint spaces are preserved. No joint
effusion.

Muscles and Tendons
Flexor and extensor tendons are intact. Increased T2 signal within
and atrophy of the intrinsic muscles of the forefoot, nonspecific,
but likely related to diabetic muscle changes.

Soft tissue
Diffuse soft tissue swelling and enhancement of the forefoot. Soft
tissue ulceration at the tip of third toe extending to bone.

1.8 x 1.5 x 2.2 cm rim enhancing fluid collection along the plantar
aspect of the residual first proximal phalanx, draining to the skin
surface dorsally.

Deep soft tissue ulcer along the plantar base of the fifth toe.
IMPRESSION: 1. 2.2 cm abscess along the plantar aspect of the residual great
toe, draining to the skin surface dorsally. Underlying osteomyelitis
of the residual first proximal phalanx.
2. Soft tissue ulceration at the tip of the third toe extending to
bone with underlying osteomyelitis of the third phalanges.
3. Deep soft tissue ulcer along the plantar base of the fifth toe in
close proximity to the fourth and fifth metatarsal heads, which
demonstrate mild marrow edema, concerning for early osteomyelitis.
4. Diffuse soft tissue swelling and enhancement of the forefoot,
consistent with cellulitis.

## 2021-04-21 IMAGING — DX DG FOOT COMPLETE 3+V*R*
3 series · 3 of 3 positions shown · non-contrast
Comparison: [DATE] outside right toe radiographs

CLINICAL DATA: Nonhealing wound right first toe

EXAM:
RIGHT FOOT COMPLETE - 3+ VIEW

[foot ap]
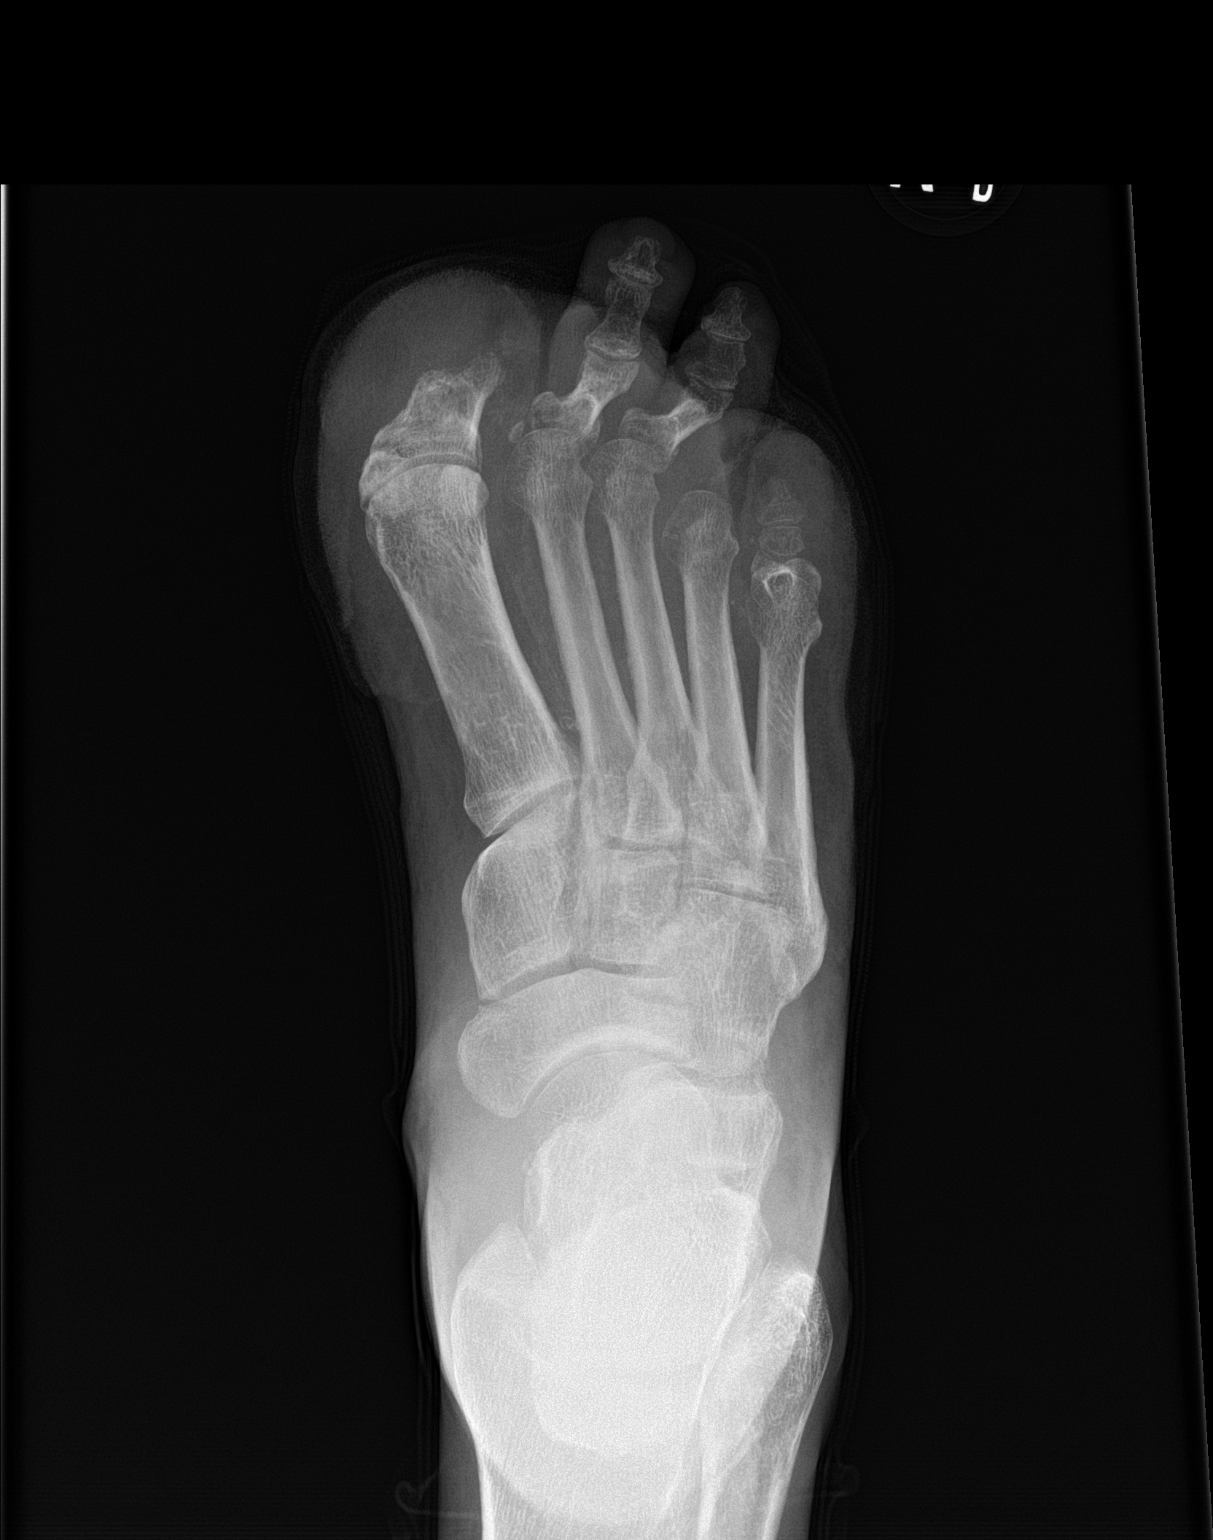

[foot obl]
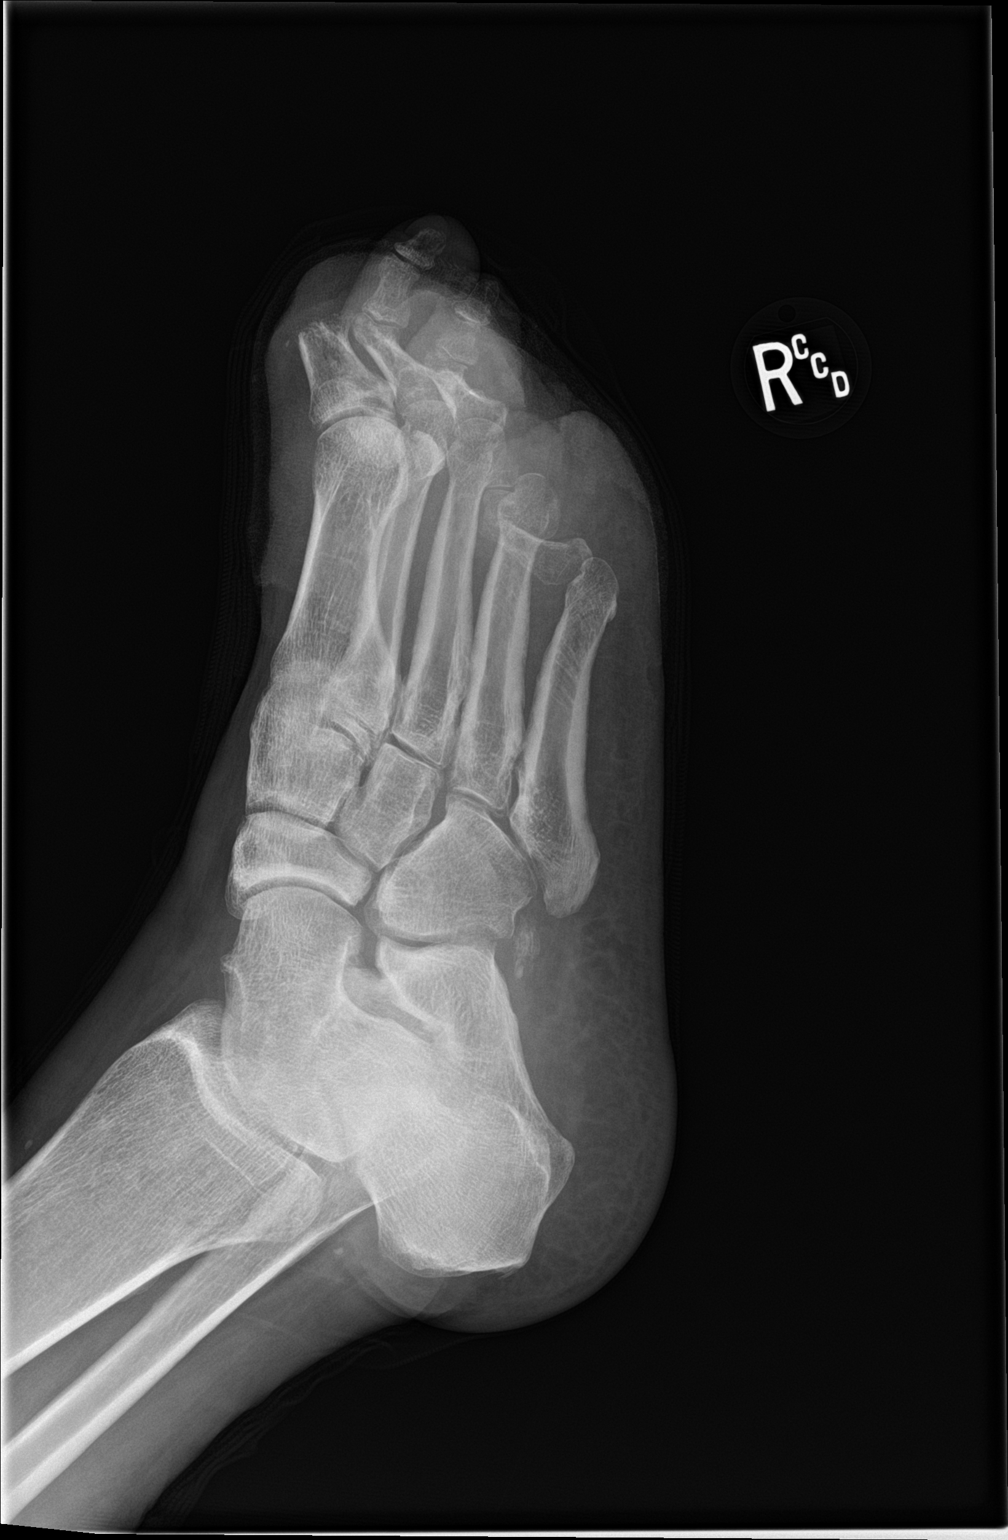

[foot lat]
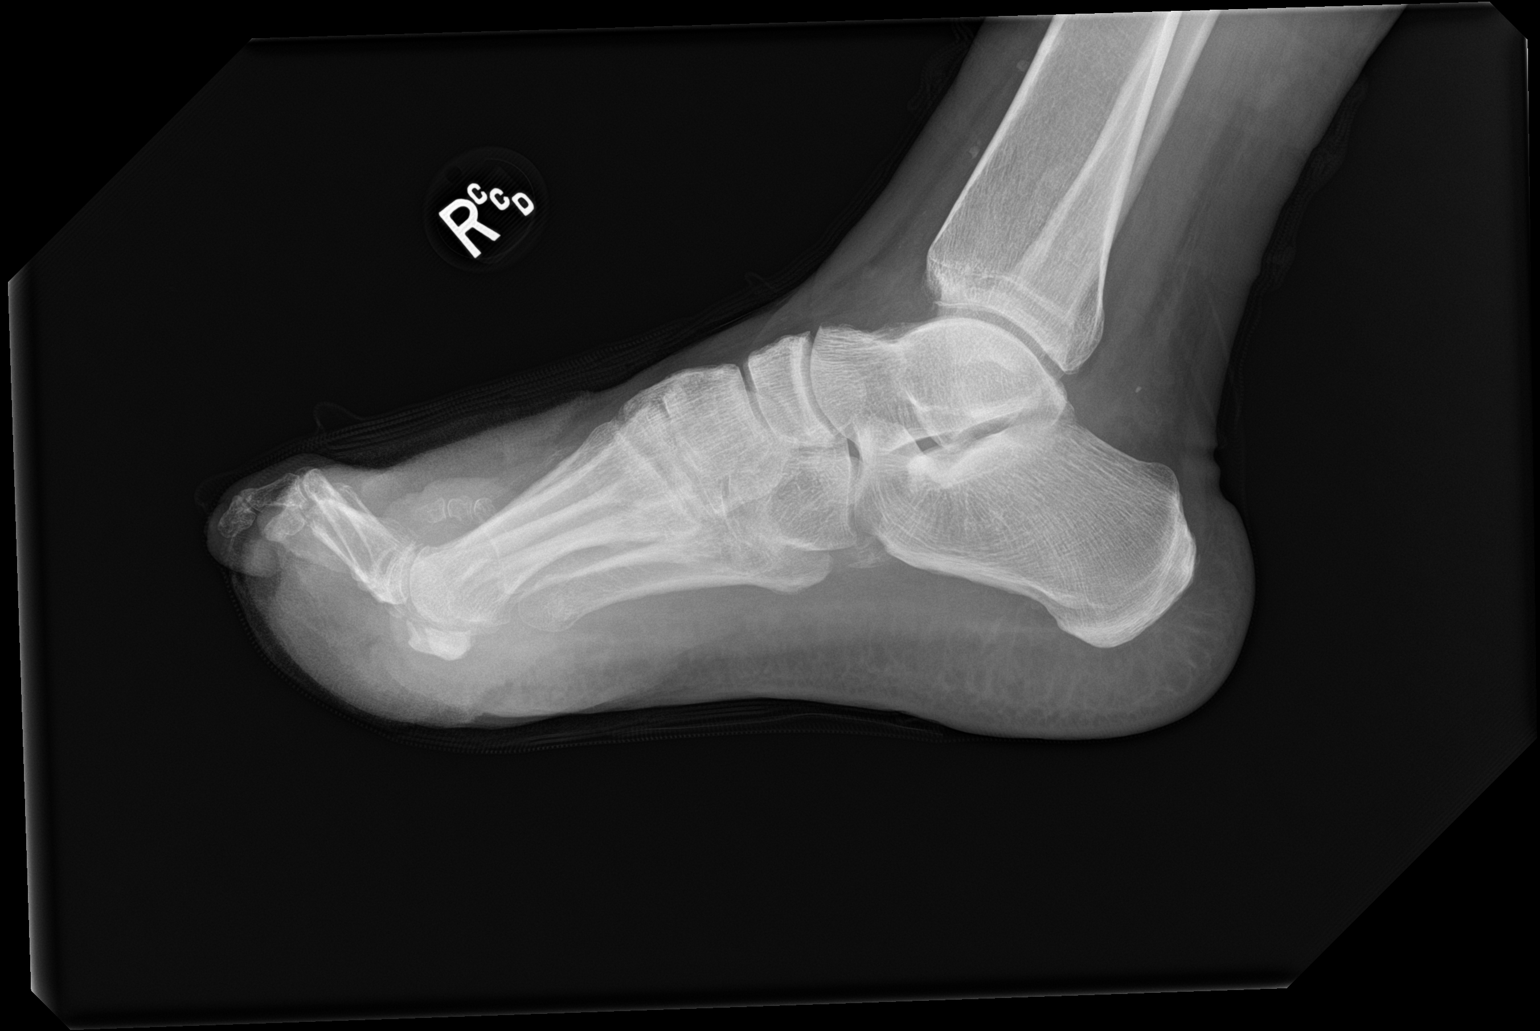

[3 of 3 positions shown; findings below may reference images not displayed]

FINDINGS: Diffuse soft tissues thickening, most prominent in the right first
toe. Indistinct distal cortical margin in the proximal phalanx right
first toe, favored to represent slightly increased erosive change in
the interval. No additional sites of erosion. Previous right fourth
toe amputation. No fracture or dislocation. No aggressive appearing
focal osseous lesions. Lisfranc joint appears intact. No radiopaque
foreign bodies. No soft tissue gas.
IMPRESSION: 1. Diffuse soft tissues thickening, most prominent in the right
first toe. Indistinct distal cortical margin in the proximal phalanx
right first toe, favored to represent slightly increased erosive
change in the interval. Acute osteomyelitis not excluded.
2. Previous right fourth toe amputation.

## 2021-04-21 MED ORDER — ACETAMINOPHEN 325 MG PO TABS
650.0000 mg | ORAL_TABLET | Freq: Four times a day (QID) | ORAL | Status: DC | PRN
  Administered 2021-04-23 – 2021-04-24 (×3): 650 mg via ORAL
  Filled 2021-04-21 (×3): qty 2

## 2021-04-21 MED ORDER — INSULIN ASPART 100 UNIT/ML IJ SOLN
0.0000 [IU] | Freq: Every day | INTRAMUSCULAR | Status: DC
  Administered 2021-04-21 – 2021-04-23 (×2): 2 [IU] via SUBCUTANEOUS

## 2021-04-21 MED ORDER — SODIUM CHLORIDE 0.9% FLUSH
3.0000 mL | Freq: Two times a day (BID) | INTRAVENOUS | Status: DC
  Administered 2021-04-21 – 2021-04-24 (×6): 3 mL via INTRAVENOUS

## 2021-04-21 MED ORDER — SODIUM CHLORIDE 0.9% FLUSH: 3.0000 mL | INTRAVENOUS | Status: DC | PRN

## 2021-04-21 MED ORDER — ACETAMINOPHEN 650 MG RE SUPP: 650.0000 mg | Freq: Four times a day (QID) | RECTAL | Status: DC | PRN

## 2021-04-21 MED ORDER — VANCOMYCIN HCL 1750 MG/350ML IV SOLN
1750.0000 mg | Freq: Once | INTRAVENOUS | Status: AC
  Administered 2021-04-21: 1750 mg via INTRAVENOUS
  Filled 2021-04-21: qty 350

## 2021-04-21 MED ORDER — GADOBUTROL 1 MMOL/ML IV SOLN
9.0000 mL | Freq: Once | INTRAVENOUS | Status: AC | PRN
  Administered 2021-04-21: 9 mL via INTRAVENOUS

## 2021-04-21 MED ORDER — ONDANSETRON HCL 4 MG/2ML IJ SOLN: 4.0000 mg | Freq: Four times a day (QID) | INTRAMUSCULAR | Status: DC | PRN

## 2021-04-21 MED ORDER — OXYCODONE HCL 5 MG PO TABS
5.0000 mg | ORAL_TABLET | ORAL | Status: DC | PRN
  Administered 2021-04-24: 04:00:00 5 mg via ORAL
  Filled 2021-04-21: qty 1

## 2021-04-21 MED ORDER — ONDANSETRON HCL 4 MG PO TABS: 4.0000 mg | ORAL_TABLET | Freq: Four times a day (QID) | ORAL | Status: DC | PRN

## 2021-04-21 MED ORDER — SODIUM CHLORIDE 0.9 % IV SOLN: 250.0000 mL | INTRAVENOUS | Status: DC | PRN

## 2021-04-21 MED ORDER — INSULIN ASPART 100 UNIT/ML IJ SOLN
0.0000 [IU] | Freq: Three times a day (TID) | INTRAMUSCULAR | Status: DC
  Administered 2021-04-22 – 2021-04-23 (×4): 2 [IU] via SUBCUTANEOUS
  Administered 2021-04-23: 3 [IU] via SUBCUTANEOUS
  Administered 2021-04-24: 08:00:00 2 [IU] via SUBCUTANEOUS
  Administered 2021-04-24: 17:00:00 5 [IU] via SUBCUTANEOUS
  Administered 2021-04-24: 12:00:00 2 [IU] via SUBCUTANEOUS

## 2021-04-21 NOTE — Addendum Note (Signed)
Addended by: Francesca Oman on: 04/21/2021 10:08 AM ? ? Modules accepted: Orders ? ?

## 2021-04-21 NOTE — ED Provider Triage Note (Signed)
Emergency Medicine Provider Triage Evaluation Note ? ?[place] , a 51 y.o. male  was evaluated in triage.  Pt complains of possible osteomyelitis to right foot..  Patient had great toe amputation, he has not been on antibiotics but was seen by podiatry earlier today.  Prescribed Bactrim and advised to go to [facility] if right foot infection worsened.  Family concerned, purulent drainage noted. ? ?Review of Systems  ?Purulent drainage, foot pain.  No fevers ? ?Physical Exam  ?BP 112/73 (BP Location: Left Arm)   Pulse 92   Temp 98.1 ?F (36.7 ?C) (Oral)   Resp 16   SpO2 100%  ?Gen:   Awake, no distress   ?Resp:  Normal effort  ?MSK:   Moves extremities without difficulty  ?Other:   ? ?Medical Decision Making  ?Medically screening exam initiated at 12:08 PM.  Appropriate orders placed.  [place] was informed that the remainder of the evaluation will be completed by another provider, this initial triage assessment does not replace that evaluation, and the importance of remaining in the ED until their evaluation is complete. ? ?Patient in brace to right foot.  Bandage, unable to palpate pulses.  Podiatry note reviewed, pulses palpable in office earlier today.  Pictures in chart.  Will need to be bandaged and reassess when back in room. ?  ?Sherrill Raring, PA-C ?04/21/21 1209 ? ?

## 2021-04-21 NOTE — H&P (Signed)
?History and Physical  ? ? ?Patient: Richard Pittman BHA:[id number] DOB: 04/13/1970 ?DOA: 04/21/2021 ?DOS: the patient was seen and examined on 04/21/2021 ?PCP: [name], PA-C  ?Patient coming from: Home ? ?Chief Complaint:  ?Chief Complaint  ?Patient presents with  ? Wound Infection  ? ?HPI:  ?51 year old man PMH diabetes mellitus type 2, previous right foot infections involving surgery and amputation, came to the emergency department for pus draining from right foot.  Admitted for right foot abscess and osteomyelitis. ? ?History obtained with aid of son as Nurse, learning disability.  Patient had surgery on his foot in December, he had taken antibiotics about 3 weeks ago, yesterday he started having some pus draining from his foot.  He has sensation still in his foot and has minimal pain.  No fever or other associated symptoms noted.  No specific aggravating or alleviating factors reported. ? ?Review of Systems: No fever, visual changes, sore throat, rash, new muscle aches, chest pain, shortness of breath, dysuria, bleeding, abdominal pain ?Past Medical History:  ?Diagnosis Date  ? Skin abnormalities   ? right foot diabetic ulcer with gangrene wife does dressing change qday  ? Type 2 DM   ? checks cbg tid last blood glucose today was 198  ? Uses walker   ? ?Past Surgical History:  ?Procedure Laterality Date  ? ABDOMINAL AORTOGRAM W/LOWER EXTREMITY Bilateral 01/17/2021  ? Procedure: ABDOMINAL AORTOGRAM W/LOWER EXTREMITY;  Surgeon: Leonie Douglas, MD;  Location: [facility];  Service: Cardiovascular;  Laterality: Bilateral;  ? AMPUTATION TOE Right 01/10/2021  ? Procedure: 4TH TOE AMPUTATION;  Surgeon: Park Liter, DPM;  Location: [facility];  Service: Podiatry;  Laterality: Right;  ? AMPUTATION TOE Right 02/12/2021  ? Procedure: PARTIAL AMPUTATION GREAT TOE;  Surgeon: Park Liter, DPM;  Location: [facility] [place];  Service: Podiatry;  Laterality: Right;  ? CATARACT EXTRACTION Bilateral 07/2019   ? INCISION AND DRAINAGE Right 01/08/2021  ? Procedure: INCISION AND DRAINAGE;  Surgeon: Park Liter, DPM;  Location: [facility];  Service: Podiatry;  Laterality: Right;  ? WOUND DEBRIDEMENT Right 01/10/2021  ? Procedure: DEBRIDEMENT WOUND;  Surgeon: Park Liter, DPM;  Location: [facility];  Service: Podiatry;  Laterality: Right;  ? WOUND DEBRIDEMENT Right 02/12/2021  ? Procedure: DEBRIDEMENT WOUND;  Surgeon: Park Liter, DPM;  Location: [facility];  Service: Podiatry;  Laterality: Right;  ? ?Social History:  reports that he has never smoked. He has never used smokeless tobacco. He reports that he does not currently use alcohol. He reports that he does not use drugs. ? ?No Known Allergies ? ?Family History  ?Problem Relation Age of Onset  ? Diabetes Mother   ? ? ?Prior to Admission medications   ?Medication Sig Start Date End Date Taking? Authorizing Provider  ?acetaminophen (TYLENOL) 500 MG tablet Take 1,000 mg by mouth every 6 (six) hours as needed for mild pain. ?Patient not taking: Reported on 02/05/2021    [provider]  ?cephALEXin (KEFLEX) 500 MG capsule Take 1 capsule (500 mg total) by mouth 2 (two) times daily. 02/12/21   Park Liter, DPM  ?ferrous sulfate 325 (65 FE) MG tablet Take 1 tablet (325 mg total) by mouth daily. ?Patient not taking: Reported on 02/05/2021 01/18/21 01/18/22  Regalado, Jon Billings A, MD  ?glipiZIDE (GLUCOTROL) 10 MG tablet Take 10 mg by mouth 2 (two) times daily before a meal.    [provider]  ?metFORMIN (GLUMETZA) 1000 MG (MOD)  24 hr tablet Take 1,000 mg by mouth 2 (two) times daily with a meal.    [provider]  ? ? ?Physical Exam: ?Vitals:  ? 04/21/21 1515 04/21/21 1600 04/21/21 1630 04/21/21 1745  ?BP: (!) 180/83 (!) 168/82 (!) 169/81 (!) 148/88  ?Pulse: 71 71 72 69  ?Resp: 18 18 18 18   ?Temp:   98.2 ?F (36.8 ?C) 98.7 ?F (37.1 ?C)  ?TempSrc:   Oral Oral  ?SpO2: 100% 100% 100% 100%  ?Weight:   91.6 kg   ?Height:   5\' 8"   (1.727 m)   ? ?Physical Exam ?Vitals reviewed.  ?Constitutional:   ?   General: He is not in acute distress. ?   Appearance: He is not ill-appearing or toxic-appearing.  ?HENT:  ?   Head: Normocephalic.  ?Cardiovascular:  ?   Rate and Rhythm: Normal rate and regular rhythm.  ?   Heart sounds: No murmur heard. ?   Comments: DP pulses 2+ bilaterally ?Pulmonary:  ?   Effort: Pulmonary effort is normal. No respiratory distress.  ?   Breath sounds: No wheezing, rhonchi or rales.  ?Abdominal:  ?   General: Abdomen is flat.  ?Musculoskeletal:  ?   Right lower leg: No edema.  ?   Left lower leg: No edema.  ?Skin: ?   General: Skin is warm.  ?   Comments: Right foot bandaged, not taken down, pictures noted  ?Neurological:  ?   Mental Status: He is alert.  ?Psychiatric:     ?   Mood and Affect: Mood normal.     ?   Behavior: Behavior normal.  ? ? ?Data Reviewed: ? ?BMP notable for potassium of 5.2, CBC unremarkable ?MRI right foot with 2.2 cm abscess and osteomyelitis of the third phalanges as well as cellulitis and concern for fourth and fifth metatarsal head osteomyelitis. ? ?Assessment and Plan: ?* Type 2 diabetes mellitus with right diabetic foot infection (HCC) ?-- Afebrile, vital signs stable, no leukocytosis.  No signs or symptoms of decompensation. ?-- MRI shows abscess in the foot as well as osteomyelitis multiple bones ?-- Given 1 dose of vancomycin here in the emergency department.  Last antibiotics approximately 3 weeks ago. ?-- Given clinical stability, I will hold off on further antibiotics until he is seen by podiatry and can undergo surgical exploration and deep culture.  Certainly if condition were to worsen would initiate empiric antibiotics. ?-- Podiatry has been consulted by the emergency department. ? ?Osteomyelitis (HCC) ?-- Plan as above ? ?Uncontrolled type 2 diabetes mellitus with hyperglycemia (HCC) ?-- Last hemoglobin A1c in November was 8.1. ?-- Blood sugar in the 200s.  Anion gap normal. ?-- Hold  oral agents.  Sliding scale insulin.  Start low-dose Lantus while inpatient. ? ?Hyperkalemia ?-- Minimal.  Follow-up. ? ? ? Advance Care Planning: Full ? ?Consults: podiatry Dr. Gala Lewandowsky  ? ?Family Communication: son ? ?Severity of Illness: ?The appropriate patient status for this patient is INPATIENT. Inpatient status is judged to be reasonable and necessary in order to provide the required intensity of service to ensure the patient's safety. The patient's presenting symptoms, physical exam findings, and initial radiographic and laboratory data in the context of their chronic comorbidities is felt to place them at high risk for further clinical deterioration. Furthermore, it is not anticipated that the patient will be medically stable for discharge from the hospital within 2 midnights of admission.  ? ?* I certify that at the point of admission it  is my clinical judgment that the patient will require inpatient hospital care spanning beyond 2 midnights from the point of admission due to high intensity of service, high risk for further deterioration and high frequency of surveillance required.* ? ?Author: ?Brendia Sacks, MD ?04/21/2021 5:50 PM ? ?For on call review [email or web address].  ?

## 2021-04-21 NOTE — ED Triage Notes (Signed)
Pt reports having surgery to right foot back in late November. Pt was seen by foot doctor today and was told to come to ER for further evaluation and possible MRI. Reports minimal pain at this time. Denies fever, chills, and confusion.  ?

## 2021-04-21 NOTE — Assessment & Plan Note (Deleted)
--   Last hemoglobin A1c in November was 8.1. ?-- Blood sugar stable,  anion gap normal. ?-- Hold oral agents.  Sliding scale insulin.  Consider low-dose Lantus while inpatient. ?

## 2021-04-21 NOTE — Assessment & Plan Note (Deleted)
Plan as above.  

## 2021-04-21 NOTE — ED Provider Notes (Addendum)
?[facility]-EMERGENCY DEPT ?Provider Note ? ? ?CSN: [id number] ?Arrival date & time: 04/21/21  1104 ? ?  ? ?History ? ?Chief Complaint  ?Patient presents with  ? Wound Infection  ? ? ?Richard Pittman is a 51 y.o. male. ? ?Pt is a 51 yo male with PMH of diabetes on oral medications-no insulin sent in by podiatrist for foot pain and concerns for oosteomyelitis. Pt admits to infection on left great toe December 28th 2022. States he had stiches removed 3 weeks ago, started having purulent drainage and was started on antibiotics. Stopped antibiotics one week ago.  Yesterday pt starting noticing purulent drainage again. Denies fevers, chills, nausea, or vomiting. Denies swelling or foot pain.  ? ?The history is provided by the patient. A language interpreter was used.  ? ?  ? ?Home Medications ?Prior to Admission medications   ?Medication Sig Start Date End Date Taking? Authorizing Provider  ?acetaminophen (TYLENOL) 500 MG tablet Take 1,000 mg by mouth every 6 (six) hours as needed for mild pain. ?Patient not taking: Reported on 02/05/2021    [provider]  ?cephALEXin (KEFLEX) 500 MG capsule Take 1 capsule (500 mg total) by mouth 2 (two) times daily. 02/12/21   Park Liter, DPM  ?ferrous sulfate 325 (65 FE) MG tablet Take 1 tablet (325 mg total) by mouth daily. ?Patient not taking: Reported on 02/05/2021 01/18/21 01/18/22  Regalado, Jon Billings A, MD  ?glipiZIDE (GLUCOTROL) 10 MG tablet Take 10 mg by mouth 2 (two) times daily before a meal.    [provider]  ?metFORMIN (GLUMETZA) 1000 MG (MOD) 24 hr tablet Take 1,000 mg by mouth 2 (two) times daily with a meal.    [provider]  ?   ? ?Allergies    ?Patient has no known allergies.   ? ?Review of Systems   ?Review of Systems  ?Constitutional:  Negative for chills and fever.  ?HENT:  Negative for ear pain and sore throat.   ?Eyes:  Negative for pain and visual disturbance.  ?Respiratory:  Negative for cough and  shortness of breath.   ?Cardiovascular:  Negative for chest pain and palpitations.  ?Gastrointestinal:  Negative for abdominal pain and vomiting.  ?Genitourinary:  Negative for dysuria and hematuria.  ?Musculoskeletal:  Negative for arthralgias and back pain.  ?Skin:  Positive for wound. Negative for color change and rash.  ?Neurological:  Negative for seizures and syncope.  ?All other systems reviewed and are negative. ? ?Physical Exam ?Updated Vital Signs ?BP 132/72   Pulse 79   Temp 98.1 ?F (36.7 ?C) (Oral)   Resp 18   SpO2 100%  ?Physical Exam ?Vitals and nursing note reviewed.  ?Constitutional:   ?   General: He is not in acute distress. ?   Appearance: He is well-developed.  ?HENT:  ?   Head: Normocephalic and atraumatic.  ?Eyes:  ?   Conjunctiva/sclera: Conjunctivae normal.  ?Cardiovascular:  ?   Rate and Rhythm: Normal rate and regular rhythm.  ?   Heart sounds: No murmur heard. ?Pulmonary:  ?   Effort: Pulmonary effort is normal. No respiratory distress.  ?   Breath sounds: Normal breath sounds.  ?Abdominal:  ?   Palpations: Abdomen is soft.  ?   Tenderness: There is no abdominal tenderness.  ?Musculoskeletal:     ?   General: No swelling.  ?   Cervical back: Neck supple.  ?Feet:  ?   Comments: Great toe amputation. Purulent drainage. Gangrenous  third toe. Please see attached photos. ?Skin: ?   General: Skin is warm and dry.  ?   Capillary Refill: Capillary refill takes less than 2 seconds.  ?Neurological:  ?   Mental Status: He is alert.  ?Psychiatric:     ?   Mood and Affect: Mood normal.  ? ? ? ? ? ? ? ?ED Results / Procedures / Treatments   ?Labs ?(all labs ordered are listed, but only abnormal results are displayed) ?Labs Reviewed  ?BASIC METABOLIC PANEL - Abnormal; Notable for the following components:  ?    Result Value  ? Potassium 5.2 (*)   ? Glucose, Bld 215 (*)   ? BUN 27 (*)   ? All other components within normal limits  ?CBG MONITORING, ED - Abnormal; Notable for the following components:   ? Glucose-Capillary 192 (*)   ? All other components within normal limits  ?CBC WITH DIFFERENTIAL/PLATELET  ? ? ?EKG ?None ? ?Radiology ?No results found. ? ?Procedures ?Procedures  ? ? ?Medications Ordered in ED ?Medications - No data to display ? ?ED Course/ Medical Decision Making/ A&P ?  ?                        ?Medical Decision Making ?Amount and/or Complexity of Data Reviewed ?Labs: ordered. ?Radiology: ordered. ? ?Risk ?Prescription drug management. ? ? ?3:17 PM ? 51 yo male with PMH of diabetes on oral medications-no insulin sent in by podiatrist for foot pain and concerns for oosteomyelitis. ? ?Please see attached photos. ?Offered and declined pain medication. ? ?No s/s sepsis.  ?No leukocytosis.  ?Xray demonstrates; ?1. Diffuse soft tissues thickening, most prominent in the right ?first toe. Indistinct distal cortical margin in the proximal phalanx ?right first toe, favored to represent slightly increased erosive ?change in the interval. Acute osteomyelitis not excluded. ?2. Previous right fourth toe amputation. ? ?MRI demonstrates: ?1. 2.2 cm abscess along the plantar aspect of the residual great ?toe, draining to the skin surface dorsally. Underlying osteomyelitis ?of the residual first proximal phalanx. ?2. Soft tissue ulceration at the tip of the third toe extending to ?bone with underlying osteomyelitis of the third phalanges. ?3. Deep soft tissue ulcer along the plantar base of the fifth toe in ?close proximity to the fourth and fifth metatarsal heads, which ?demonstrate mild marrow edema, concerning for early osteomyelitis. ?4. Diffuse soft tissue swelling and enhancement of the forefoot, ?consistent with cellulitis. ? ?Vancomycin ordered.  ?Patient's podiatrist Dr. Dr. Gala Lewandowsky with [facility] notified. Patient accepted by [facility]. ? ? ? ? ?Final Clinical Impression(s) / ED Diagnoses ?Final diagnoses:  ?Wound infection  ?Osteomyelitis, unspecified site, unspecified  type (HCC)  ? ? ?Rx / DC Orders ?ED Discharge Orders   ? ? None  ? ?  ? ? ?  ?Franne Forts, DO ?04/21/21 1518 ? ?  ?Franne Forts, DO ?04/21/21 1654 ? ?

## 2021-04-21 NOTE — Assessment & Plan Note (Deleted)
--   Afebrile, vital signs stable, no leukocytosis.  No signs or symptoms of decompensation. ?-- MRI showed abscess in the foot as well as osteomyelitis multiple bones ?-- Given 1 dose of vancomycin here in the emergency department.  Last antibiotics approximately 3 weeks ago. ?-- Given clinical stability, continue to hold off on further antibiotics until post-op and deep culture obtained.  Certainly if condition were to worsen would initiate empiric antibiotics. ?-- Appreciate podiatry intervention ?

## 2021-04-21 NOTE — Progress Notes (Signed)
A consult was received from an ED physician for Vancomycin per pharmacy dosing.  The patient's profile has been reviewed for ht/wt/allergies/indication/available labs.   ?A one time order has been placed for Vancomycin 1750 mg .  Further antibiotics/pharmacy consults should be ordered by admitting physician if indicated.       ?                ?Thank you, ? ?Lynann Beaver PharmD, BCPS ?Clinical Pharmacist ?[facility] [phone] ?04/21/2021 4:10 PM ? ?

## 2021-04-21 NOTE — Hospital Course (Addendum)
50-y/o, PMH of type II DM, previous right toe amputation presented to ED from podiatry office due to purulent drainage and pain. ?Admitted for right foot abscess and osteomyelitis. ?3/7 underwent transmetatarsal amputation.  Treated with IV vancomycin and Zosyn. ?

## 2021-04-21 NOTE — Assessment & Plan Note (Addendum)
resolved 

## 2021-04-21 NOTE — Progress Notes (Signed)
? ?HPI: 51 y.o. male presenting today for concern of worsening infection to the right forefoot.  Patient does have history of right great toe amputation and he has been managed by Dr. [name], no longer with our practice for diabetic foot infections.  They are concerned because of drainage coming from the great toe amputation site.  He presents with his wife, Elane Fritz, for further treatment and evaluation  ? ?Past Medical History:  ?Diagnosis Date  ? Skin abnormalities   ? right foot diabetic ulcer with gangrene wife does dressing change qday  ? Type 2 DM   ? checks cbg tid last blood glucose today was 198  ? Uses walker   ? ? ?Past Surgical History:  ?Procedure Laterality Date  ? ABDOMINAL AORTOGRAM W/LOWER EXTREMITY Bilateral 01/17/2021  ? Procedure: ABDOMINAL AORTOGRAM W/LOWER EXTREMITY;  Surgeon: Leonie Douglas, MD;  Location: [facility];  Service: Cardiovascular;  Laterality: Bilateral;  ? AMPUTATION TOE Right 01/10/2021  ? Procedure: 4TH TOE AMPUTATION;  Surgeon: Park Liter, DPM;  Location: [facility];  Service: Podiatry;  Laterality: Right;  ? AMPUTATION TOE Right 02/12/2021  ? Procedure: PARTIAL AMPUTATION GREAT TOE;  Surgeon: Park Liter, DPM;  Location: [facility];  Service: Podiatry;  Laterality: Right;  ? CATARACT EXTRACTION Bilateral 07/2019  ? INCISION AND DRAINAGE Right 01/08/2021  ? Procedure: INCISION AND DRAINAGE;  Surgeon: Park Liter, DPM;  Location: [facility];  Service: Podiatry;  Laterality: Right;  ? WOUND DEBRIDEMENT Right 01/10/2021  ? Procedure: DEBRIDEMENT WOUND;  Surgeon: Park Liter, DPM;  Location: [facility];  Service: Podiatry;  Laterality: Right;  ? WOUND DEBRIDEMENT Right 02/12/2021  ? Procedure: DEBRIDEMENT WOUND;  Surgeon: Park Liter, DPM;  Location: [facility];  Service: Podiatry;  Laterality: Right;  ? ? ?No Known Allergies ?  ? ? ? ? ? ?Physical Exam: ?General: The patient is alert and oriented x3 in no acute  distress. ? ?Dermatology: Abscess with drainage noted right hallux amputation stump.  There is also an ulcer to the plantar aspect of the third toe.  This appears dry and somewhat stable.  Please see above photos ? ?Vascular: VAS [place] ABI W/WO TBI 01/16/2021 ?Summary:  ?Right: Resting right ankle-brachial index is within normal range. No  ?evidence of significant right lower extremity arterial disease. The right  ?toe-brachial index is abnormal.  ? ?Left: Resting left ankle-brachial index is within normal range. No  ?evidence of significant left lower extremity arterial disease. The left  ?toe-brachial index is normal.  ? ?VAS [place] LOWER EXTREMITY ARTERIAL DUPLEX RIGHT 01/16/2021 ?Summary:  ?Right: 30-49% stenosis noted in the common femoral artery. 30-49% stenosis  ?noted in the superficial femoral artery. 50-74% stenosis noted in the  ?popliteal artery. 30-49% stenosis noted in the anterior tibial artery.  ?Heterogenous plaque noted throughout  ?the right lower extremity arterial system. Waveforms are multiphasic.  ? ?Neurological: Light touch and protective threshold absent ? ?Musculoskeletal Exam: History of prior partial right great toe and right fourth toe amputation ? ?Radiographic Exam 04/10/2021 RT foot:  ?There is some cortical irregularity of the amputation stump of the right hallux concerning for possible osteomyelitis.  Unfortunately there are no postop x-rays to compare to since surgery on 02/12/2021. ? ?Assessment: ?1. S/p partial amputation RT hallux.  02/12/2021 ?2. Ulcer RT lateral forefoot with fat layer exposed ?3.  Abscess with purulence amputation stump RT hallux concerning for osteomyelitis ?4.  Diabetes mellitus with peripheral polyneuropathy ? ? ?  Plan of Care:  ?1. Patient evaluated. X-Rays reviewed that were taken last visit 04/10/2021.  ?2.  Purulent drainage was expressed from the distal amputation stump of the right great toe.  There is concern for residual osteomyelitis of the toe.   Unfortunately the patient does not have insurance which makes treatment difficult.  Patient cannot afford outpatient MRI imaging or additional outpatient surgery or care with other specialties such as infectious disease. ?3.  Cultures taken and sent to pathology for culture and sensitivity ?4.  Prescription for Bactrim DS twice daily #20 ?5.  If the condition worsens recommend the patient immediately goes to [facility] hospital ED for evaluation and possible admission.  If admitted the patient will need to get updated MRI of the right forefoot to determine the extent of the infection and possible osteomyelitis ?6.  Recommend Betadine with dry sterile dressings daily.  Betadine provided  ?7.  Continue minimal weightbearing in the cam boot  ?8.  Return to clinic in 2 weeks, or podiatry will see the patient if admitted ?  ?  ?Felecia Shelling, DPM ?[facility] ? ?Dr. Felecia Shelling, DPM  ?  ?[address]                                        ?[place], [place] [address]                ?Office [phone]  ?Fax [phone] ? ? ? ? ?

## 2021-04-22 ENCOUNTER — Inpatient Hospital Stay (HOSPITAL_COMMUNITY): Payer: 59 | Admitting: Anesthesiology

## 2021-04-22 ENCOUNTER — Encounter (HOSPITAL_COMMUNITY)
Admission: EM | Disposition: A | Discharge status: Home / Self Care | Payer: Self-pay | Source: Home / Self Care | Attending: Family Medicine

## 2021-04-22 ENCOUNTER — Inpatient Hospital Stay (HOSPITAL_COMMUNITY): Payer: 59

## 2021-04-22 ENCOUNTER — Other Ambulatory Visit: Payer: Self-pay

## 2021-04-22 ENCOUNTER — Encounter (HOSPITAL_COMMUNITY): Payer: Self-pay | Admitting: Family Medicine

## 2021-04-22 DIAGNOSIS — M869 Osteomyelitis, unspecified: Secondary | ICD-10-CM

## 2021-04-22 DIAGNOSIS — E1165 Type 2 diabetes mellitus with hyperglycemia: Secondary | ICD-10-CM

## 2021-04-22 DIAGNOSIS — E11621 Type 2 diabetes mellitus with foot ulcer: Secondary | ICD-10-CM

## 2021-04-22 DIAGNOSIS — L089 Local infection of the skin and subcutaneous tissue, unspecified: Secondary | ICD-10-CM

## 2021-04-22 DIAGNOSIS — M86171 Other acute osteomyelitis, right ankle and foot: Secondary | ICD-10-CM

## 2021-04-22 DIAGNOSIS — E1151 Type 2 diabetes mellitus with diabetic peripheral angiopathy without gangrene: Secondary | ICD-10-CM

## 2021-04-22 HISTORY — PX: TRANSMETATARSAL AMPUTATION: SHX6197

## 2021-04-22 LAB — GLUCOSE, CAPILLARY
Glucose-Capillary: 181 mg/dL — ABNORMAL HIGH (ref 70–99)
Glucose-Capillary: 167 mg/dL — ABNORMAL HIGH (ref 70–99)
Glucose-Capillary: 139 mg/dL — ABNORMAL HIGH (ref 70–99)
Glucose-Capillary: 199 mg/dL — ABNORMAL HIGH (ref 70–99)
Glucose-Capillary: 159 mg/dL — ABNORMAL HIGH (ref 70–99)

## 2021-04-22 LAB — BASIC METABOLIC PANEL
Anion gap: 7 (ref 5–15)
BUN: 27 mg/dL — ABNORMAL HIGH (ref 6–20)
CO2: 25 mmol/L (ref 22–32)
Calcium: 8.6 mg/dL — ABNORMAL LOW (ref 8.9–10.3)
Chloride: 102 mmol/L (ref 98–111)
Creatinine, Ser: 1.06 mg/dL (ref 0.61–1.24)
GFR, Estimated: >60 mL/min (ref >60)
Glucose, Bld: 161 mg/dL — ABNORMAL HIGH (ref 70–99)
Potassium: 4.2 mmol/L (ref 3.5–5.1)
Sodium: 134 mmol/L — ABNORMAL LOW (ref 135–145)

## 2021-04-22 LAB — CBC
HCT: 37.9 % — ABNORMAL LOW (ref 39.0–52.0)
Hemoglobin: 12.5 g/dL — ABNORMAL LOW (ref 13.0–17.0)
MCH: 26.2 pg (ref 26.0–34.0)
MCHC: 33 g/dL (ref 30.0–36.0)
MCV: 79.3 fL — ABNORMAL LOW (ref 80.0–100.0)
Platelets: 155 10*3/uL (ref 150–400)
RBC: 4.78 MIL/uL (ref 4.22–5.81)
RDW: 13.6 % (ref 11.5–15.5)
WBC: 7.4 10*3/uL (ref 4.0–10.5)
nRBC: 0 % (ref 0.0–0.2)

## 2021-04-22 LAB — PREALBUMIN: Prealbumin: 20.9 mg/dL (ref 18–38)

## 2021-04-22 LAB — RESP PANEL BY RT-PCR (FLU A&B, COVID) ARPGX2
Influenza A by PCR: NEGATIVE
Influenza B by PCR: NEGATIVE
SARS Coronavirus 2 by RT PCR: NEGATIVE

## 2021-04-22 IMAGING — DX DG FOOT COMPLETE 3+V*R*
3 series · 3 of 3 positions shown · non-contrast
Comparison: [DATE]

CLINICAL DATA: Transmetatarsal amputation.

EXAM:
RIGHT FOOT COMPLETE - 3+ VIEW

[foot ap]
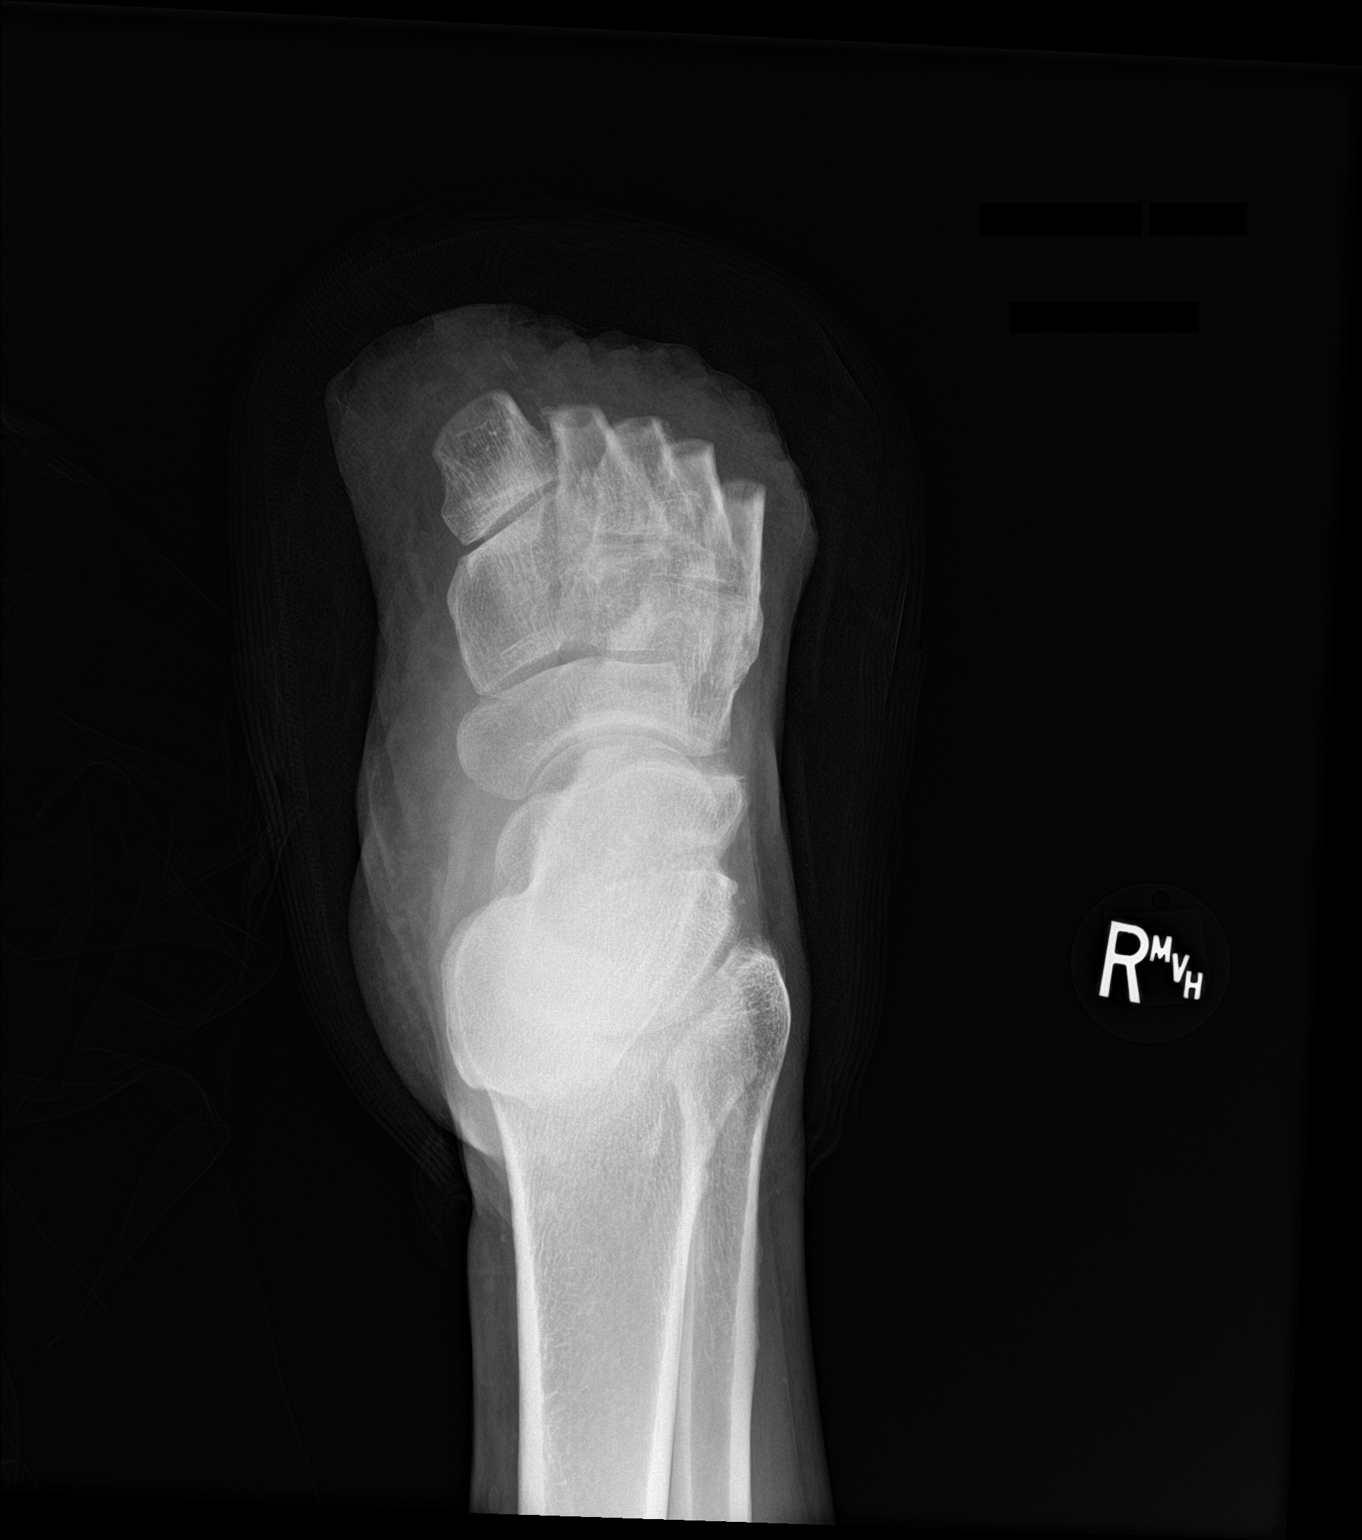

[foot obl]
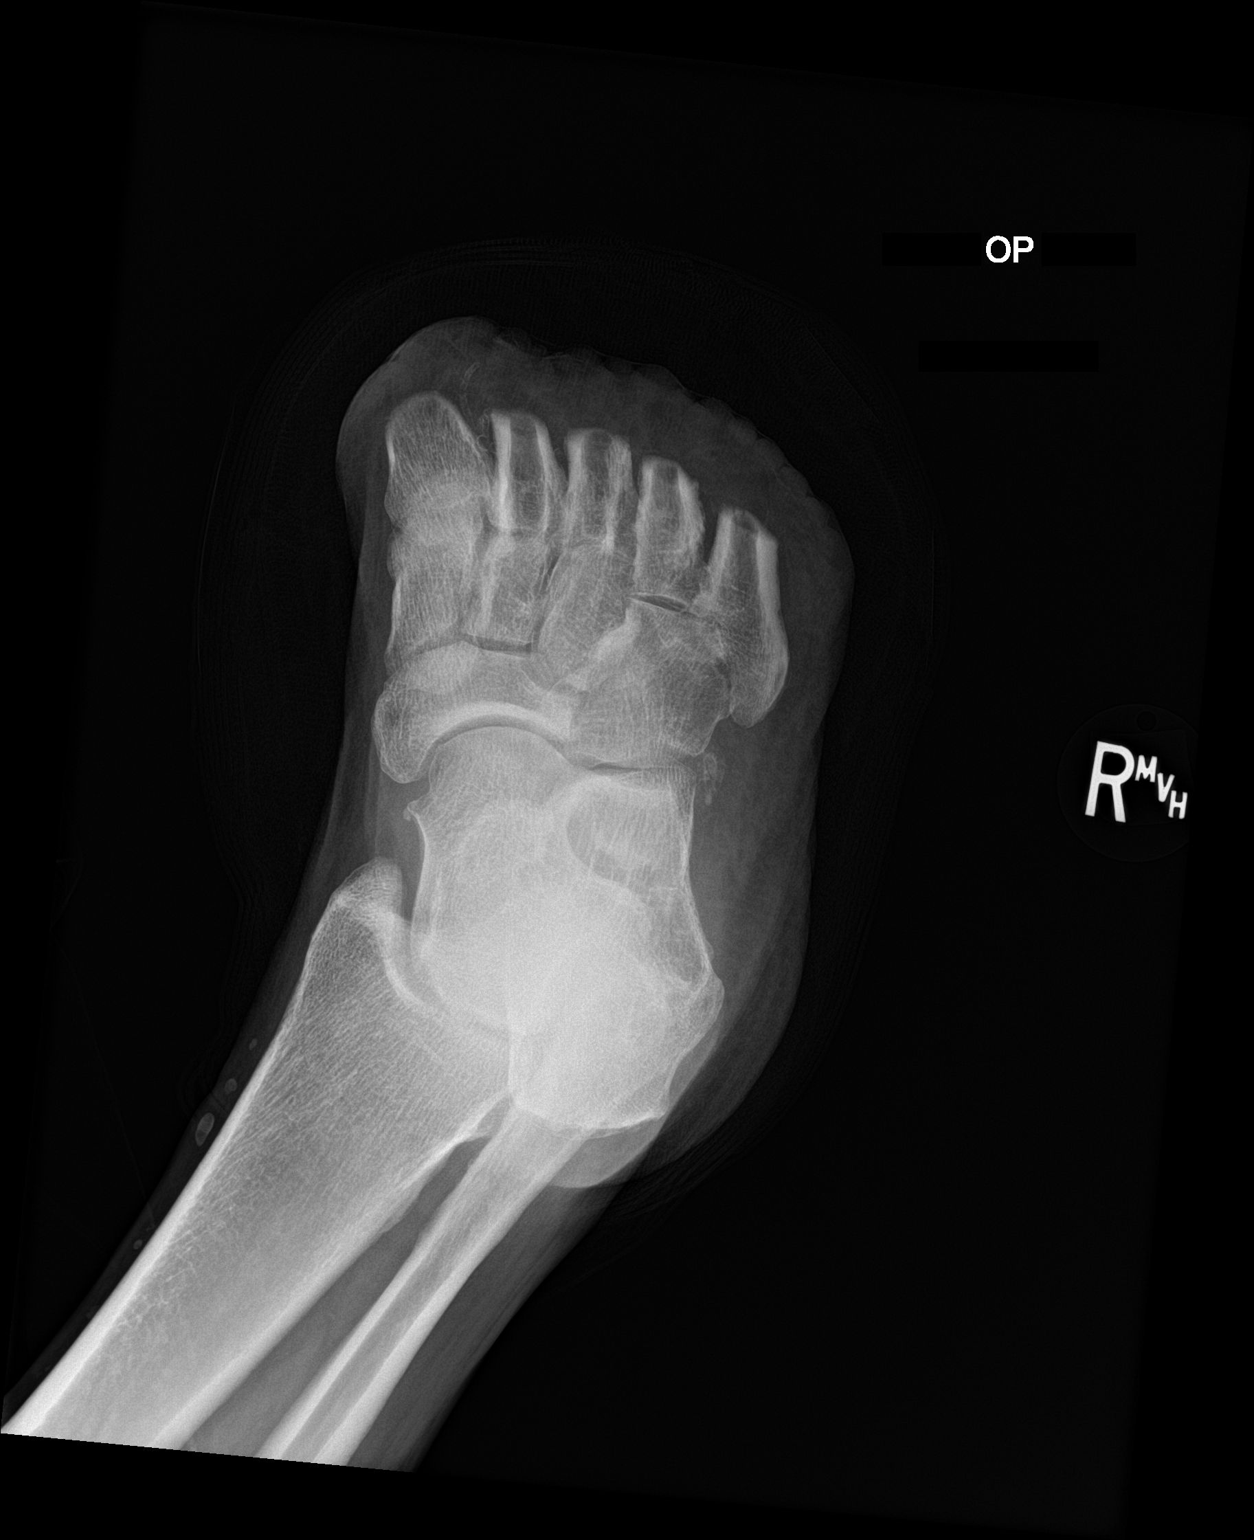

[foot lat]
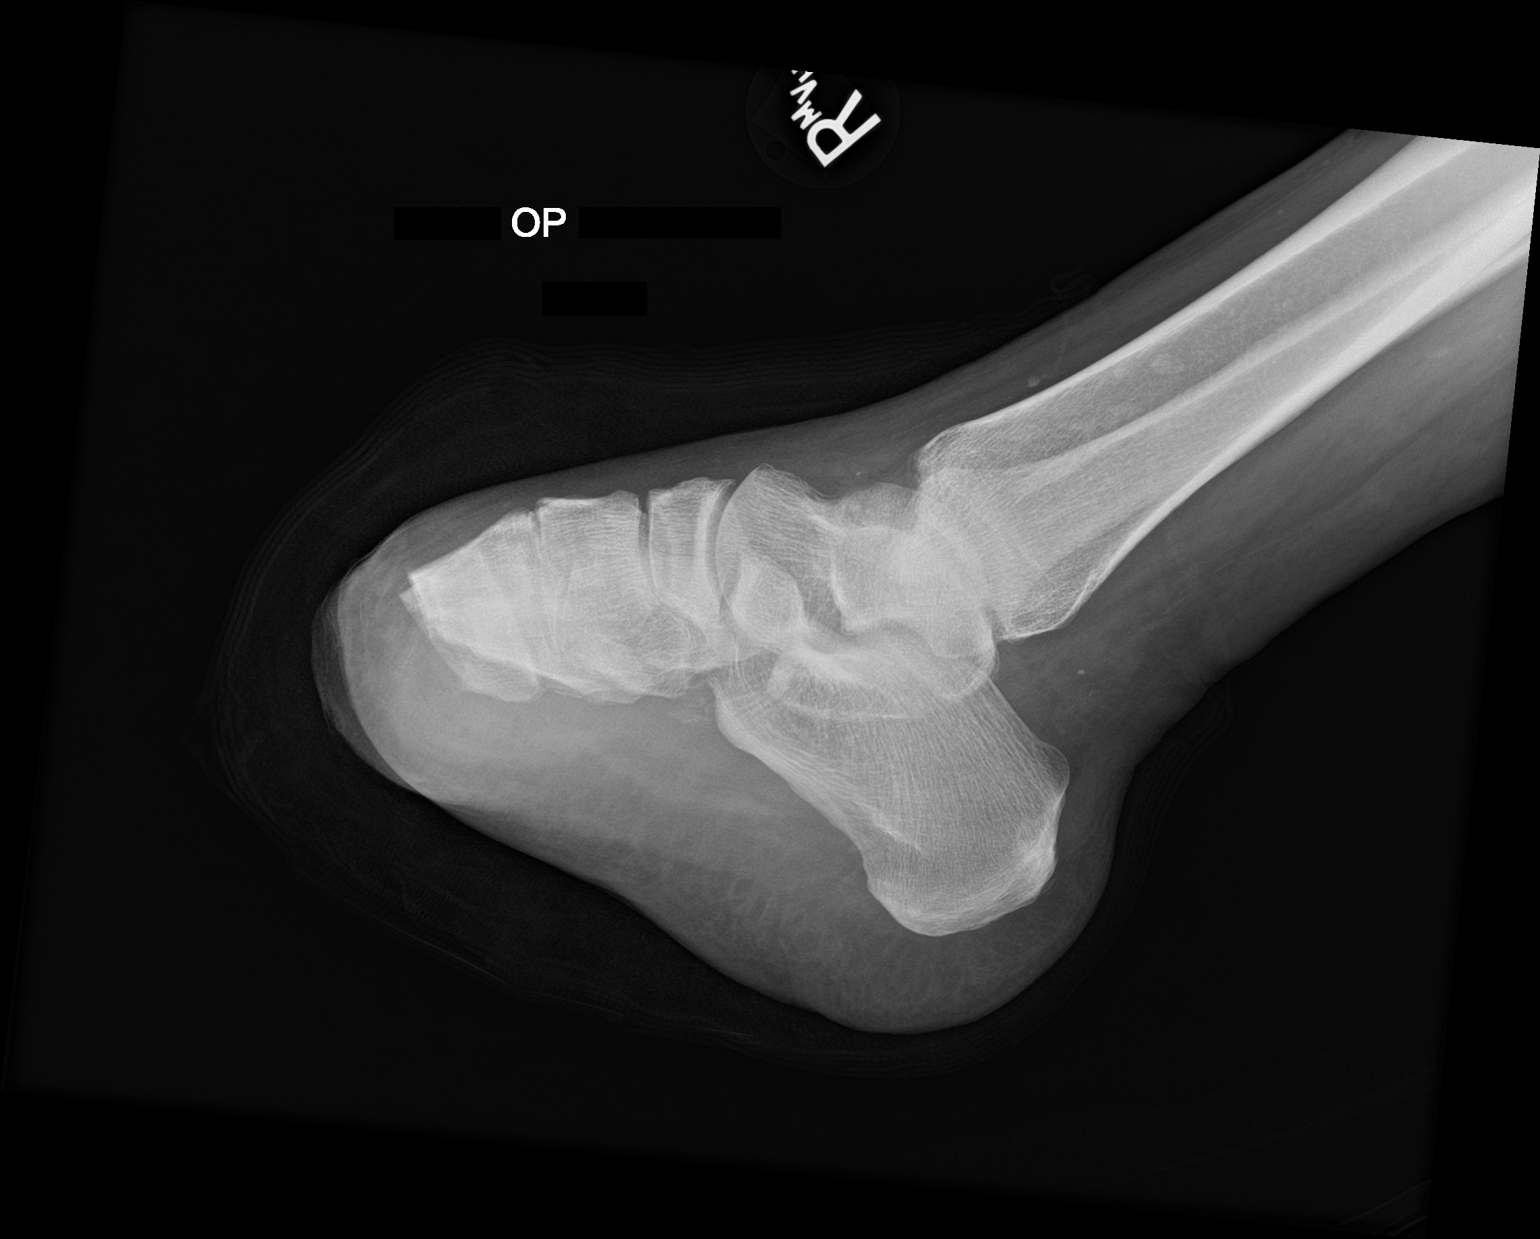

[3 of 3 positions shown; findings below may reference images not displayed]

FINDINGS: Three-view show amputation at the level of the proximal diaphyses of
the metatarsals. No complicating feature seen by radiography.
IMPRESSION: Transmetatarsal amputation as above.

## 2021-04-22 SURGERY — AMPUTATION, FOOT, TRANSMETATARSAL
Anesthesia: Regional | Site: Toe | Laterality: Right

## 2021-04-22 MED ORDER — OXYCODONE HCL 5 MG PO TABS: 5.0000 mg | ORAL_TABLET | Freq: Once | ORAL | Status: DC | PRN

## 2021-04-22 MED ORDER — OXYCODONE HCL 5 MG/5ML PO SOLN: 5.0000 mg | Freq: Once | ORAL | Status: DC | PRN

## 2021-04-22 MED ORDER — CHLORHEXIDINE GLUCONATE 0.12 % MT SOLN
15.0000 mL | OROMUCOSAL | Status: AC
  Administered 2021-04-22: 15 mL via OROMUCOSAL

## 2021-04-22 MED ORDER — MIDAZOLAM HCL 2 MG/2ML IJ SOLN
INTRAMUSCULAR | Status: AC
  Filled 2021-04-22: qty 2

## 2021-04-22 MED ORDER — HYDROMORPHONE HCL 1 MG/ML IJ SOLN: 0.2500 mg | INTRAMUSCULAR | Status: DC | PRN

## 2021-04-22 MED ORDER — ONDANSETRON HCL 4 MG/2ML IJ SOLN
INTRAMUSCULAR | Status: AC
  Filled 2021-04-22: qty 2

## 2021-04-22 MED ORDER — CEFAZOLIN SODIUM-DEXTROSE 2-4 GM/100ML-% IV SOLN
2.0000 g | Freq: Once | INTRAVENOUS | Status: AC
  Administered 2021-04-22: 2 g via INTRAVENOUS

## 2021-04-22 MED ORDER — CEFAZOLIN SODIUM-DEXTROSE 2-4 GM/100ML-% IV SOLN
INTRAVENOUS | Status: AC
  Filled 2021-04-22: qty 100

## 2021-04-22 MED ORDER — MIDAZOLAM HCL 5 MG/5ML IJ SOLN
INTRAMUSCULAR | Status: DC | PRN
  Administered 2021-04-22: 2 mg via INTRAVENOUS

## 2021-04-22 MED ORDER — LACTATED RINGERS IV SOLN
INTRAVENOUS | Status: DC
Start: 2021-04-22 — End: 2021-04-22

## 2021-04-22 MED ORDER — ONDANSETRON HCL 4 MG/2ML IJ SOLN: 4.0000 mg | Freq: Once | INTRAMUSCULAR | Status: DC | PRN

## 2021-04-22 MED ORDER — FENTANYL CITRATE (PF) 100 MCG/2ML IJ SOLN
INTRAMUSCULAR | Status: DC | PRN
Start: 2021-04-22 — End: 2021-04-22
  Administered 2021-04-22 (×2): 50 ug via INTRAVENOUS

## 2021-04-22 MED ORDER — KETOROLAC TROMETHAMINE 30 MG/ML IJ SOLN: 30.0000 mg | Freq: Once | INTRAMUSCULAR | Status: DC | PRN

## 2021-04-22 MED ORDER — FENTANYL CITRATE PF 50 MCG/ML IJ SOSY
50.0000 ug | PREFILLED_SYRINGE | INTRAMUSCULAR | Status: DC
  Administered 2021-04-22: 50 ug via INTRAVENOUS

## 2021-04-22 MED ORDER — PIPERACILLIN-TAZOBACTAM 3.375 G IVPB
3.3750 g | Freq: Three times a day (TID) | INTRAVENOUS | Status: DC
  Administered 2021-04-22 – 2021-04-24 (×5): 3.375 g via INTRAVENOUS
  Filled 2021-04-22 (×5): qty 50

## 2021-04-22 MED ORDER — CHLORHEXIDINE GLUCONATE CLOTH 2 % EX PADS: 6.0000 | MEDICATED_PAD | Freq: Once | CUTANEOUS | Status: DC

## 2021-04-22 MED ORDER — VANCOMYCIN HCL 1500 MG/300ML IV SOLN
1500.0000 mg | Freq: Every day | INTRAVENOUS | Status: DC
  Administered 2021-04-22 – 2021-04-24 (×3): 1500 mg via INTRAVENOUS
  Filled 2021-04-22 (×3): qty 300

## 2021-04-22 MED ORDER — PROPOFOL 500 MG/50ML IV EMUL
INTRAVENOUS | Status: DC | PRN
  Administered 2021-04-22: 100 ug/kg/min via INTRAVENOUS

## 2021-04-22 MED ORDER — FENTANYL CITRATE PF 50 MCG/ML IJ SOSY
PREFILLED_SYRINGE | INTRAMUSCULAR | Status: AC
Start: 2021-04-22 — End: 2021-04-23
  Filled 2021-04-22: qty 2

## 2021-04-22 MED ORDER — BUPIVACAINE-EPINEPHRINE (PF) 0.5% -1:200000 IJ SOLN
INTRAMUSCULAR | Status: DC | PRN
  Administered 2021-04-22: 30 mL via PERINEURAL

## 2021-04-22 MED ORDER — PHENYLEPHRINE 40 MCG/ML (10ML) SYRINGE FOR IV PUSH (FOR BLOOD PRESSURE SUPPORT)
PREFILLED_SYRINGE | INTRAVENOUS | Status: AC
  Filled 2021-04-22: qty 10

## 2021-04-22 MED ORDER — SODIUM CHLORIDE 0.9 % IR SOLN
Status: DC | PRN
  Administered 2021-04-22: 1000 mL

## 2021-04-22 MED ORDER — DEXAMETHASONE SODIUM PHOSPHATE 10 MG/ML IJ SOLN
INTRAMUSCULAR | Status: AC
  Filled 2021-04-22: qty 1

## 2021-04-22 MED ORDER — FENTANYL CITRATE (PF) 100 MCG/2ML IJ SOLN
INTRAMUSCULAR | Status: AC
  Filled 2021-04-22: qty 2

## 2021-04-22 MED ORDER — MIDAZOLAM HCL 2 MG/2ML IJ SOLN
1.0000 mg | INTRAMUSCULAR | Status: DC
  Administered 2021-04-22: 2 mg via INTRAVENOUS

## 2021-04-22 SURGICAL SUPPLY — 48 items
BAG COUNTER SPONGE SURGICOUNT (BAG) IMPLANT
BLADE AVERAGE 25X9 (BLADE) IMPLANT
BLADE OSCILLATING/SAGITTAL (BLADE) ×1
BLADE SURG 15 STRL LF DISP TIS (BLADE) ×2 IMPLANT
BLADE SURG 15 STRL SS (BLADE) ×2
BLADE SW THK.38XMED LNG THN (BLADE) ×1 IMPLANT
BNDG ELASTIC 4X5.8 VLCR STR LF (GAUZE/BANDAGES/DRESSINGS) ×2 IMPLANT
BNDG ELASTIC 6X5.8 VLCR STR LF (GAUZE/BANDAGES/DRESSINGS) ×2 IMPLANT
BNDG ESMARK 4X9 LF (GAUZE/BANDAGES/DRESSINGS) ×2 IMPLANT
BNDG GAUZE ELAST 4 BULKY (GAUZE/BANDAGES/DRESSINGS) ×2 IMPLANT
COVER BACK TABLE 60X90IN (DRAPES) ×2 IMPLANT
CUFF TOURN SGL QUICK 18X4 (TOURNIQUET CUFF) IMPLANT
DRAPE EXTREMITY T 121X128X90 (DISPOSABLE) ×2 IMPLANT
DRAPE IMP U-DRAPE 54X76 (DRAPES) ×2 IMPLANT
DRSG EMULSION OIL 3X3 NADH (GAUZE/BANDAGES/DRESSINGS) ×2 IMPLANT
DRSG PAD ABDOMINAL 8X10 ST (GAUZE/BANDAGES/DRESSINGS) ×1 IMPLANT
DURAPREP 26ML APPLICATOR (WOUND CARE) ×2 IMPLANT
ELECT REM PT RETURN 15FT ADLT (MISCELLANEOUS) ×2 IMPLANT
GAUZE 4X4 16PLY ~~LOC~~+RFID DBL (SPONGE) IMPLANT
GAUZE SPONGE 4X4 12PLY STRL (GAUZE/BANDAGES/DRESSINGS) ×2 IMPLANT
GAUZE XEROFORM 5X9 LF (GAUZE/BANDAGES/DRESSINGS) ×1 IMPLANT
GLOVE SURG ENC MOIS LTX SZ7.5 (GLOVE) ×4 IMPLANT
GOWN STRL REUS W/ TWL LRG LVL3 (GOWN DISPOSABLE) ×1 IMPLANT
GOWN STRL REUS W/ TWL XL LVL3 (GOWN DISPOSABLE) ×1 IMPLANT
GOWN STRL REUS W/TWL LRG LVL3 (GOWN DISPOSABLE) ×1
GOWN STRL REUS W/TWL XL LVL3 (GOWN DISPOSABLE) ×1
KIT BASIN OR (CUSTOM PROCEDURE TRAY) ×2 IMPLANT
KIT TURNOVER KIT A (KITS) IMPLANT
NDL HYPO 25X1 1.5 SAFETY (NEEDLE) ×1 IMPLANT
NDL SAFETY ECLIPSE 18X1.5 (NEEDLE) IMPLANT
NEEDLE HYPO 18GX1.5 SHARP (NEEDLE)
NEEDLE HYPO 25X1 1.5 SAFETY (NEEDLE) ×2 IMPLANT
NS IRRIG 1000ML POUR BTL (IV SOLUTION) IMPLANT
PADDING CAST ABS 4INX4YD NS (CAST SUPPLIES) ×1
PADDING CAST ABS COTTON 4X4 ST (CAST SUPPLIES) ×1 IMPLANT
PENCIL SMOKE EVACUATOR (MISCELLANEOUS) ×2 IMPLANT
SET BERKELEY SUCTION TUBING (SUCTIONS) ×1 IMPLANT
STAPLER VISISTAT (STAPLE) ×2 IMPLANT
STAPLER VISISTAT 35W (STAPLE) ×1 IMPLANT
STOCKINETTE 6  STRL (DRAPES) ×1
STOCKINETTE 6 STRL (DRAPES) ×1 IMPLANT
SUT MNCRL AB 3-0 PS2 18 (SUTURE) ×2 IMPLANT
SUT PROLENE 3 0 PS 2 (SUTURE) ×5 IMPLANT
SUT SILK 2 0 (SUTURE) ×1
SUT SILK 2-0 18XBRD TIE 12 (SUTURE) ×1 IMPLANT
SYR 10ML LL (SYRINGE) IMPLANT
SYR BULB EAR ULCER 3OZ GRN STR (SYRINGE) ×2 IMPLANT
UNDERPAD 30X36 HEAVY ABSORB (UNDERPADS AND DIAPERS) ×2 IMPLANT

## 2021-04-22 NOTE — Progress Notes (Addendum)
Pharmacy Antibiotic Note ? ?Richard Pittman is a 51 y.o. male admitted on 04/21/2021 with osteomyelitis of the R forefoot.  Pharmacy has been consulted for vancomycin dosing. Got vancomycin 1750 mg IV x 1 yesterday 3/6, but held off on continuing abx until OR cultures obtained today 3/7. ? ?Plan: ?Vancomycin 1500 mg IV q24 hr (est AUC 460 based on SCr 1.06; Vd 0.5) ?Measure vancomycin AUC at steady state as indicated ?SCr daily while on vanc + Zosyn combo ?Zosyn per MD, dosing appropriate ? ?Height: 5\' 8"  (172.7 cm) ?Weight: 91.6 kg (201 lb 15.1 oz) ?IBW/kg (Calculated) : 68.4 ? ?Temp (24hrs), Avg:98.1 ?F (36.7 ?C), Min:97.4 ?F (36.3 ?C), Max:98.6 ?F (37 ?C) ? ?Recent Labs  ?Lab 04/21/21 ?1227 04/22/21 ?0429  ?WBC 8.4 7.4  ?CREATININE 1.16 1.06  ?  ?Estimated Creatinine Clearance: 90.6 mL/min (by C-G formula based on SCr of 1.06 mg/dL).   ? ?No Known Allergies ? ?Antimicrobials this admission: ?3/6 vancomycin >>  ?3/7 Zosyn >>  ? ?Dose adjustments this admission: ? ?Microbiology results: ?3/6 superficial wound Cx: few GPC clusters ?3/7 OR wound Cx: sent ? ?Thank you for allowing pharmacy to be a part of this patient?s care. ? ?Richard Pittman A ?04/22/2021 8:14 PM ? ?

## 2021-04-22 NOTE — Anesthesia Postprocedure Evaluation (Signed)
Anesthesia Post Note ? ?Patient: Richard Pittman ? ?Procedure(s) Performed: TRANSMETATARSAL AMPUTATION (Right: Toe) ? ?  ? ?Patient location during evaluation: PACU ?Anesthesia Type: Regional ?Level of consciousness: awake and alert ?Pain management: pain level controlled ?Vital Signs Assessment: post-procedure vital signs reviewed and stable ?Respiratory status: spontaneous breathing, nonlabored ventilation, respiratory function stable and patient connected to nasal cannula oxygen ?Cardiovascular status: stable and blood pressure returned to baseline ?Postop Assessment: no apparent nausea or vomiting ?Anesthetic complications: no ? ? ?No notable events documented. ? ?Last Vitals:  ?Vitals:  ? 04/22/21 1945 04/22/21 1959  ?BP: (!) 166/91 (!) 142/72  ?Pulse: 80 80  ?Resp: 16 18  ?Temp: (!) 36.4 ?C (!) 36.3 ?C  ?SpO2: 99% 100%  ?  ?Last Pain:  ?Vitals:  ? 04/22/21 1959  ?TempSrc: Oral  ?PainSc: 0-No pain  ? ? ?  ?  ?  ?  ?  ?  ? ?Trevor Iha ? ? ? ? ?

## 2021-04-22 NOTE — Anesthesia Procedure Notes (Signed)
Anesthesia Regional Block: Popliteal block  ? ?Pre-Anesthetic Checklist: , timeout performed,  Correct Patient, Correct Site, Correct Laterality,  Correct Procedure, Correct Position, site marked,  Risks and benefits discussed,  Pre-op evaluation,  At surgeon's request and post-op pain management ? ?Laterality: Lower and Right ? ?Prep: Maximum Sterile Barrier Precautions used, chloraprep     ?  ?Needles:  ?Injection technique: Single-shot ? ?Needle Type: Echogenic Needle   ? ? ?Needle Length: 9cm  ?Needle Gauge: 21  ? ? ? ?Additional Needles: ? ? ?Procedures:,,,, ultrasound used (permanent image in chart),,    ?Narrative:  ?Start time: 04/22/2021 4:32 PM ?End time: 04/22/2021 4:40 PM ?Injection made incrementally with aspirations every 5 mL. ? ?Performed by: Personally  ?Anesthesiologist: Trevor Iha, MD ? ?Additional Notes: ?Block assessed. Patient tolerated procedure well. ? ? ? ? ?

## 2021-04-22 NOTE — Progress Notes (Signed)
Transition of Care (TOC) Screening Note ? ?Patient Details  ?Name: Richard Pittman ?Date of Birth: [birth year] ? ?Transition of Care (TOC) CM/SW Contact:    ?Ewing Schlein, LCSW ?Phone Number: ?04/22/2021, 11:50 AM ? ?Transition of Care Department [facility]) has reviewed patient and no TOC needs have been identified at this time. We will continue to monitor patient advancement through interdisciplinary progression rounds. If new patient transition needs arise, please place a TOC consult. ?

## 2021-04-22 NOTE — Progress Notes (Signed)
?  Progress Note ? ? ?Patient: Richard Pittman [id number] DOB: 1970-07-07 DOA: 04/21/2021     1 ?DOS: the patient was seen and examined on 04/22/2021 ?  ?Brief hospital course: ?51 year old man PMH diabetes mellitus type 2, previous right foot infections involving surgery and amputation, came to the emergency department for pus draining from right foot.  Admitted for right foot abscess and osteomyelitis. ?--3/7 feels okay today some pain in right foot.  Discussed with Dr. Earleen Newport.  Plan for operative intervention pending MRI availability, hopefully today.  We will start empiric antibiotics postoperatively given clinical stability will continue to withhold present moment in order to improve chance of obtaining useful culture data. ? ?Assessment and Plan: ?* Type 2 diabetes mellitus with right diabetic foot infection (HCC) ?-- Afebrile, vital signs stable, no leukocytosis.  No signs or symptoms of decompensation. ?-- MRI showed abscess in the foot as well as osteomyelitis multiple bones ?-- Given 1 dose of vancomycin here in the emergency department.  Last antibiotics approximately 3 weeks ago. ?-- Given clinical stability, continue to hold off on further antibiotics until post-op and deep culture obtained.  Certainly if condition were to worsen would initiate empiric antibiotics. ?-- Appreciate podiatry intervention ? ?Osteomyelitis ([place]) ?-- Plan as above ? ?Uncontrolled type 2 diabetes mellitus with hyperglycemia ([place]) ?-- Last hemoglobin A1c in November was 8.1. ?-- Blood sugar stable,  anion gap normal. ?-- Hold oral agents.  Sliding scale insulin.  Consider low-dose Lantus while inpatient. ? ?Hyperkalemia-resolved as of 04/22/2021 ?-- resolved. ? ? ? ? ?  ? ?Subjective:  ?Feels ok ?A little pain in right foot ? ?Interviewed and examined w/ Advertising copywriter.  ? ?Physical Exam: ?Vitals:  ? 04/21/21 2023 04/22/21 0218 04/22/21 0611 04/22/21 1328  ?BP: 111/66 120/68 137/67 119/69  ?Pulse: 74 76 72 74  ?Resp: 18  18 18 18   ?Temp: 98.4 ?F (36.9 ?C) 98.2 ?F (36.8 ?C) 98.6 ?F (37 ?C) 98.4 ?F (36.9 ?C)  ?TempSrc: Oral Oral Oral Oral  ?SpO2: 99% 97% 99% 98%  ?Weight:      ?Height:      ? ?Physical Exam ?Vitals reviewed.  ?Constitutional:   ?   General: He is not in acute distress. ?   Appearance: He is not ill-appearing or toxic-appearing.  ?Cardiovascular:  ?   Rate and Rhythm: Normal rate and regular rhythm.  ?   Heart sounds: No murmur heard. ?Pulmonary:  ?   Effort: Pulmonary effort is normal. No respiratory distress.  ?   Breath sounds: No wheezing, rhonchi or rales.  ?Skin: ?   Comments: Right foot bandaged  ?Neurological:  ?   Mental Status: He is alert.  ?Psychiatric:     ?   Mood and Affect: Mood normal.     ?   Behavior: Behavior normal.  ? ? ?Data Reviewed: ? ?BMP noted ?CBC stable ? ?Family Communication: wife at bedside, son by telephone ? ?Disposition: ?Status is: Inpatient ?Remains inpatient appropriate because: osteomyelitis, foot abscess ? Planned Discharge Destination: Home HH ? ? ? ?Time spent: 35 minutes ? ?Author: ?Murray Hodgkins, MD ?04/22/2021 2:21 PM ? ?For on call review [email or web address].  ?

## 2021-04-22 NOTE — Anesthesia Preprocedure Evaluation (Addendum)
Anesthesia Evaluation  ?Patient identified by MRN, date of birth, ID band ?Patient awake ? ? ? ?Reviewed: ?Allergy & Precautions, NPO status , Patient's Chart, lab work & pertinent test results ? ?Airway ?Mallampati: II ? ?TM Distance: >3 FB ?Neck ROM: Full ? ? ? Dental ?no notable dental hx. ?(+) Teeth Intact, Dental Advisory Given ?  ?Pulmonary ?neg pulmonary ROS,  ?  ?Pulmonary exam normal ?breath sounds clear to auscultation ? ? ? ? ? ? Cardiovascular ?Exercise Tolerance: Good ?+ Peripheral Vascular Disease  ?Normal cardiovascular exam ?Rhythm:Regular Rate:Normal ? ? ?  ?Neuro/Psych ?negative neurological ROS ? negative psych ROS  ? GI/Hepatic ?negative GI ROS, Neg liver ROS,   ?Endo/Other  ?diabetes, Type 2, Oral Hypoglycemic AgentsPoorly controlled ? Renal/GU ?Lab Results ?     Component                Value               Date                 ?     CREATININE               1.06                04/22/2021           ?     BUN                      27 (H)              04/22/2021           ?     NA                       134 (L)             04/22/2021           ?     K                        4.2                 04/22/2021           ?     CL                       102                 04/22/2021           ?     CO2                      25                  04/22/2021           ?  ? ?  ?Musculoskeletal ? ? Abdominal ?(+) + obese,   ?Peds ? Hematology ? ?(+) Blood dyscrasia, anemia , Lab Results ?     Component                Value               Date                 ?     WBC  7.4                 04/22/2021           ?     HGB                      12.5 (L)            04/22/2021           ?     HCT                      37.9 (L)            04/22/2021           ?     MCV                      79.3 (L)            04/22/2021           ?     PLT                      155                 04/22/2021           ?   ?Anesthesia Other Findings ?NKDA ? Reproductive/Obstetrics ? ?   ? ? ? ? ? ? ? ? ? ? ? ? ? ?  ?  ? ? ? ? ? ? ? ?Anesthesia Physical ?Anesthesia Plan ? ?ASA: 3 ? ?Anesthesia Plan: Regional  ? ?Post-op Pain Management: Regional block* and Minimal or no pain anticipated  ? ?Induction:  ? ?PONV Risk Score and Plan: 3 and Treatment may vary due to age or medical condition, Midazolam, Ondansetron, Propofol infusion and TIVA ? ?Airway Management Planned: Natural Airway and Nasal Cannula ? ?Additional Equipment: None ? ?Intra-op Plan:  ? ?Post-operative Plan:  ? ?Informed Consent: I have reviewed the patients History and Physical, chart, labs and discussed the procedure including the risks, benefits and alternatives for the proposed anesthesia with the patient or authorized representative who has indicated his/her understanding and acceptance.  ? ? ? ?Dental advisory given ? ?Plan Discussed with: CRNA and Anesthesiologist ? ?Anesthesia Plan Comments: (R pop block)  ? ? ? ? ? ?Anesthesia Quick Evaluation ? ?

## 2021-04-22 NOTE — Progress Notes (Signed)
Inpatient Diabetes Program Recommendations ? ?AACE/ADA: New Consensus Statement on Inpatient Glycemic Control (2015) ? ?Target Ranges:  Prepandial:   less than 140 mg/dL ?     Peak postprandial:   less than 180 mg/dL (1-2 hours) ?     Critically ill patients:  140 - 180 mg/dL  ? ?Lab Results  ?Component Value Date  ? GLUCAP 181 (H) 04/22/2021  ? HGBA1C 8.1 (H) 01/08/2021  ? ? ?Review of Glycemic Control ? Latest Reference Range & Units 04/21/21 12:29 04/21/21 17:58 04/21/21 21:17 04/22/21 07:30  ?Glucose-Capillary 70 - 99 mg/dL 192 (H) 148 (H) 228 (H) 181 (H)  ?(H): Data is abnormally high ? ?Diabetes history: DM2 ? ?Outpatient Diabetes medications: Glipizide 10 mg BID, Glumetza 1000 mg BID ? ?Current orders for Inpatient glycemic control: Novolog 0-9 units TID and 0-5 units QHS ? ?Inpatient Diabetes Program Recommendations:   ? ?Please consider obtaining current A1C ? ?Last A1C was 8.1 % in November 2022 ? ?Will continue to follow while inpatient. ? ?Thank you, ?Reche Dixon, MSN, RN ?Diabetes Coordinator ?Inpatient Diabetes Program ?[phone] (team pager from 8a-5p) ? ? ? ? ?

## 2021-04-22 NOTE — H&P (View-Only) (Signed)
Reason for Consult: Infection  °Referring Physician: Dr. Daniel Goodrich, MD ° °Richard Pittman is an 50 y.o. male.  °HPI: 50-year-old male with past medical history significant for type 2 diabetes and previous right foot infection, surgery with amputation present to the emergency department from clinic for worsening pus, drainage and infection of the right foot. MRI did reveal osteomyelitis and abscess.  Podiatry consulted for further management. ° ° ° °Past Medical History:  °Diagnosis Date  ° Skin abnormalities   ° right foot diabetic ulcer with gangrene wife does dressing change qday  ° Type 2 DM   ° checks cbg tid last blood glucose today was 198  ° Uses walker   ° ° °Past Surgical History:  °Procedure Laterality Date  ° ABDOMINAL AORTOGRAM W/LOWER EXTREMITY Bilateral 01/17/2021  ° Procedure: ABDOMINAL AORTOGRAM W/LOWER EXTREMITY;  Surgeon: Hawken, Thomas N, MD;  Location: [facility];  Service: Cardiovascular;  Laterality: Bilateral;  ° AMPUTATION TOE Right 01/10/2021  ° Procedure: 4TH TOE AMPUTATION;  Surgeon: Price, Michael J, DPM;  Location: [facility];  Service: Podiatry;  Laterality: Right;  ° AMPUTATION TOE Right 02/12/2021  ° Procedure: PARTIAL AMPUTATION GREAT TOE;  Surgeon: Price, Michael J, DPM;  Location: [facility];  Service: Podiatry;  Laterality: Right;  ° CATARACT EXTRACTION Bilateral 07/2019  ° INCISION AND DRAINAGE Right 01/08/2021  ° Procedure: INCISION AND DRAINAGE;  Surgeon: Price, Michael J, DPM;  Location: [facility];  Service: Podiatry;  Laterality: Right;  ° WOUND DEBRIDEMENT Right 01/10/2021  ° Procedure: DEBRIDEMENT WOUND;  Surgeon: Price, Michael J, DPM;  Location: [facility];  Service: Podiatry;  Laterality: Right;  ° WOUND DEBRIDEMENT Right 02/12/2021  ° Procedure: DEBRIDEMENT WOUND;  Surgeon: Price, Michael J, DPM;  Location: [facility];  Service: Podiatry;  Laterality: Right;  ° ° °Family History  °Problem Relation Age of Onset  ° Diabetes  Mother   ° ° °Social History:  reports that he has never smoked. He has never used smokeless tobacco. He reports that he does not currently use alcohol. He reports that he does not use drugs. ° °Allergies: No Known Allergies ° °Medications: I have reviewed the patient's current medications. ° °Results for orders placed or performed during the hospital encounter of 04/21/21 (from the past 48 hour(s))  °Basic metabolic panel     Status: Abnormal  ° Collection Time: 04/21/21 12:27 PM  °Result Value Ref Range  ° Sodium 137 135 - 145 mmol/L  ° Potassium 5.2 (H) 3.5 - 5.1 mmol/L  ° Chloride 105 98 - 111 mmol/L  ° CO2 26 22 - 32 mmol/L  ° Glucose, Bld 215 (H) 70 - 99 mg/dL  °  Comment: Glucose reference range applies only to samples taken after fasting for at least 8 hours.  ° BUN 27 (H) 6 - 20 mg/dL  ° Creatinine, Ser 1.16 0.61 - 1.24 mg/dL  ° Calcium 9.3 8.9 - 10.3 mg/dL  ° GFR, Estimated >60 >60 mL/min  °  Comment: (NOTE) °Calculated using the CKD-EPI Creatinine Equation (2021) °  ° Anion gap 6 5 - 15  °  Comment: Performed at [facility], [address] [place], [place] [address]  °CBC with Differential     Status: Abnormal  ° Collection Time: 04/21/21 12:27 PM  °Result Value Ref Range  ° WBC 8.4 4.0 - 10.5 K/uL  ° RBC 5.48 4.22 - 5.81 MIL/uL  ° Hemoglobin 14.3 13.0 - 17.0 g/dL  °   HCT 43.4 39.0 - 52.0 %   MCV 79.2 (L) 80.0 - 100.0 fL   MCH 26.1 26.0 - 34.0 pg   MCHC 32.9 30.0 - 36.0 g/dL   RDW 01.7 49.4 - 49.6 %   Platelets 178 150 - 400 K/uL   nRBC 0.0 0.0 - 0.2 %   Neutrophils Relative % 56 %   Neutro Abs 4.7 1.7 - 7.7 K/uL   Lymphocytes Relative 27 %   Lymphs Abs 2.3 0.7 - 4.0 K/uL   Monocytes Relative 4 %   Monocytes Absolute 0.4 0.1 - 1.0 K/uL   Eosinophils Relative 11 %   Eosinophils Absolute 0.9 (H) 0.0 - 0.5 K/uL   Basophils Relative 2 %   Basophils Absolute 0.2 (H) 0.0 - 0.1 K/uL   Immature Granulocytes 0 %   Abs Immature Granulocytes 0.02 0.00 - 0.07 K/uL    Comment:  Performed at [facility], [address], [place], [place] [address]  POC CBG, ED     Status: Abnormal   Collection Time: 04/21/21 12:29 PM  Result Value Ref Range   Glucose-Capillary 192 (H) 70 - 99 mg/dL    Comment: Glucose reference range applies only to samples taken after fasting for at least 8 hours.  Sedimentation rate     Status: None   Collection Time: 04/21/21  1:50 PM  Result Value Ref Range   Sed Rate 10 0 - 16 mm/hr    Comment: Performed at [facility], [address], [place], [place] [address]  C-reactive protein     Status: None   Collection Time: 04/21/21  1:50 PM  Result Value Ref Range   CRP <0.5 <1.0 mg/dL    Comment: Performed at [facility], [address], [place], [place] [address]  Glucose, capillary     Status: Abnormal   Collection Time: 04/21/21  5:58 PM  Result Value Ref Range   Glucose-Capillary 148 (H) 70 - 99 mg/dL    Comment: Glucose reference range applies only to samples taken after fasting for at least 8 hours.  Glucose, capillary     Status: Abnormal   Collection Time: 04/21/21  9:17 PM  Result Value Ref Range   Glucose-Capillary 228 (H) 70 - 99 mg/dL    Comment: Glucose reference range applies only to samples taken after fasting for at least 8 hours.  Basic metabolic panel     Status: Abnormal   Collection Time: 04/22/21  4:29 AM  Result Value Ref Range   Sodium 134 (L) 135 - 145 mmol/L   Potassium 4.2 3.5 - 5.1 mmol/L    Comment: DELTA CHECK NOTED   Chloride 102 98 - 111 mmol/L   CO2 25 22 - 32 mmol/L   Glucose, Bld 161 (H) 70 - 99 mg/dL    Comment: Glucose reference range applies only to samples taken after fasting for at least 8 hours.   BUN 27 (H) 6 - 20 mg/dL   Creatinine, Ser 7.01 0.61 - 1.24 mg/dL   Calcium 8.6 (L) 8.9 - 10.3 mg/dL   GFR, Estimated >77 >93 mL/min    Comment: (NOTE) Calculated using the CKD-EPI Creatinine Equation (2021)    Anion gap 7 5 - 15    Comment: Performed at  [facility], [address], [place], [place] [address]  CBC     Status: Abnormal   Collection Time: 04/22/21  4:29 AM  Result Value Ref Range   WBC 7.4  4.0 - 10.5 K/uL  ° RBC 4.78 4.22 - 5.81 MIL/uL  ° Hemoglobin 12.5 (L) 13.0 - 17.0 g/dL  ° HCT 37.9 (L) 39.0 - 52.0 %  ° MCV 79.3 (L) 80.0 - 100.0 fL  ° MCH 26.2 26.0 - 34.0 pg  ° MCHC 33.0 30.0 - 36.0 g/dL  ° RDW 13.6 11.5 - 15.5 %  ° Platelets 155 150 - 400 K/uL  ° nRBC 0.0 0.0 - 0.2 %  °  Comment: Performed at  [facility], [address] [place], [place] [address]  °Prealbumin     Status: None  ° Collection Time: 04/22/21  4:29 AM  °Result Value Ref Range  ° Prealbumin 20.9 18 - 38 mg/dL  °  Comment: Performed at [facility], [address] [place], [place] [address]  °Glucose, capillary     Status: Abnormal  ° Collection Time: 04/22/21  7:30 AM  °Result Value Ref Range  ° Glucose-Capillary 181 (H) 70 - 99 mg/dL  °  Comment: Glucose reference range applies only to samples taken after fasting for at least 8 hours.  ° ° °MR FOOT RIGHT W WO CONTRAST ° °Result Date: 04/21/2021 °CLINICAL DATA:  Chronic great toe pain. EXAM: MRI OF THE RIGHT FOREFOOT WITHOUT AND WITH CONTRAST TECHNIQUE: Multiplanar, multisequence MR imaging of the right forefoot was performed before and after the administration of intravenous contrast. CONTRAST:  9mL GADAVIST GADOBUTROL 1 MMOL/ML IV SOLN COMPARISON:  Right foot x-rays from same day. MRI right foot dated January 07, 2021. FINDINGS: Bones/Joint/Cartilage Prior partial amputation of the great toe with marrow edema, cortical irregularity, and early decreased T1 marrow signal involving the residual first proximal phalanx. There is also abnormal marrow edema with corresponding patchy decreased T1 marrow signal involving the third phalanges. Mild marrow edema in the fourth and fifth metatarsal heads. Prior amputation of the fourth toe. No fracture or dislocation. Joint spaces are preserved.  No joint effusion. Muscles and Tendons Flexor and extensor tendons are intact. Increased T2 signal within and atrophy of the intrinsic muscles of the forefoot, nonspecific, but likely related to diabetic muscle changes. Soft tissue Diffuse soft tissue swelling and enhancement of the forefoot. Soft tissue ulceration at the tip of third toe extending to bone. 1.8 x 1.5 x 2.2 cm rim enhancing fluid collection along the plantar aspect of the residual first proximal phalanx, draining to the skin surface dorsally. Deep soft tissue ulcer along the plantar base of the fifth toe. IMPRESSION: 1. 2.2 cm abscess along the plantar aspect of the residual great toe, draining to the skin surface dorsally. Underlying osteomyelitis of the residual first proximal phalanx. 2. Soft tissue ulceration at the tip of the third toe extending to bone with underlying osteomyelitis of the third phalanges. 3. Deep soft tissue ulcer along the plantar base of the fifth toe in close proximity to the fourth and fifth metatarsal heads, which demonstrate mild marrow edema, concerning for early osteomyelitis. 4. Diffuse soft tissue swelling and enhancement of the forefoot, consistent with cellulitis. Electronically Signed   By: William T Derry M.D.   On: 04/21/2021 15:42  ° °DG Foot Complete Right ° °Result Date: 04/21/2021 °CLINICAL DATA:  Nonhealing wound right first toe EXAM: RIGHT FOOT COMPLETE - 3+ VIEW COMPARISON:  04/10/2021 outside right toe radiographs FINDINGS: Diffuse soft tissues thickening, most prominent in the right first toe. Indistinct distal cortical margin in the proximal phalanx right first toe, favored to represent slightly increased erosive change in the interval.   No additional sites of erosion. Previous right fourth toe amputation. No fracture or dislocation. No aggressive appearing focal osseous lesions. Lisfranc joint appears intact. No radiopaque foreign bodies. No soft tissue gas. IMPRESSION: 1. Diffuse soft tissues thickening,  most prominent in the right first toe. Indistinct distal cortical margin in the proximal phalanx right first toe, favored to represent slightly increased erosive change in the interval. Acute osteomyelitis not excluded. 2. Previous right fourth toe amputation. Electronically Signed   By: Delbert Phenix M.D.   On: 04/21/2021 13:05    Review of Systems Blood pressure 137/67, pulse 72, temperature 98.6 F (37 C), temperature source Oral, resp. rate 18, height 5\' 8"  (1.727 m), weight 91.6 kg, SpO2 99 %. Physical Exam General: AAO x3, NAD  Dermatological: Skin is warm, dry and supple bilateral. Nails x 10 are well manicured; remaining integument appears unremarkable at this time. There are no open sores, no preulcerative lesions, no rash or signs of infection present.       Vascular: Dorsalis Pedis artery and Posterior Tibial artery pedal pulses are 2/4 bilateral with immedate capillary fill time. There is no pain with calf compression, swelling, warmth, erythema.   Neruologic: Sensation decreased.   Musculoskeletal: Hammertoe contractures noted at the remaining digits.   Assessment/Plan: Patient seen evaluated bedside with his wife present and via translator [phone]).  The patient's son is also available on the phone.  I had extensive conversation with the patient, family in regards to the clinical findings as well as MRI findings that reveal abscess, osteomyelitis.  I have recommended transmetatarsal amputation on the right side.  We did discuss conservative options as well clearing long-term antibiotics and incision and drainage, bone biopsy.  My recommendation is the amputation given the infection as well as history of previous to amputations and the digital contractures.  After long discussion regarding the both the pros and cons of both options they are in agreement to proceed with a transmetatarsal amputation.  We discussed all alternatives, risks, complications including spread of  infection, bleeding, further amputation as well as general risks of surgery.  We discussed the postoperative course as well.  We will plan for surgery hopefully later this evening or tomorrow afternoon pending OR availability.  N.p.o. for now.  I spent 1 hour with the patient including 45 minutes in face-to-face discussion regarding the findings, options, and surgical intervention.   #[id number], DPM  Ovid Curd 04/22/2021, 7:46 AM

## 2021-04-22 NOTE — Brief Op Note (Signed)
04/21/2021 - 04/22/2021 ? ?6:56 PM ? ?PATIENT:  Richard Pittman  51 y.o. male ? ?PRE-OPERATIVE DIAGNOSIS:  infection right foot ? ?POST-OPERATIVE DIAGNOSIS:  infection right foot ? ?PROCEDURE:  Procedure(s): ?TRANSMETATARSAL AMPUTATION (Right) ? ?SURGEON:  Surgeon(s) and Role: ?   Trula Slade, DPM - Primary ? ?PHYSICIAN ASSISTANT:  ? ?ASSISTANTS: none  ? ?ANESTHESIA:   general ? ?EBL:  30 mL  ? ?BLOOD ADMINISTERED:none ? ?DRAINS: none  ? ?LOCAL MEDICATIONS USED:  NONE ? ?SPECIMEN:  Source of Specimen:  foot for pathology, wound cultures ? ?DISPOSITION OF SPECIMEN:  PATHOLOGY ? ?COUNTS:  YES ? ?TOURNIQUET:   ?Total Tourniquet Time Documented: ?Calf (Right) - 32 minutes ?Total: Calf (Right) - 32 minutes ? ? ?DICTATION: .Dragon Dictation ? ?PLAN OF CARE: Admit to inpatient  ? ?PATIENT DISPOSITION:  PACU - hemodynamically stable. ?  ?Delay start of Pharmacological VTE agent (>24hrs) due to surgical blood loss or risk of bleeding: no ? ?Intraoperative findings: ?Purulence noted from the ulcer submetatarsal 4/5 and also from the hallux. TMA preformed. There was found to be adequate bleeding and do to this the tourniquet was inflated to help control blood loss. The remaining bone appears viable. Soft tissue debrided and culture obtained. Wound irrigated and hemostasis achieved. Wound closed.  ? ?Remain for 48 hours. Will change the bandage likely Thursday. NWB. Likely d/c with a week of oral antibiotics to help with any remaninig soft tissue infection.  ? ?

## 2021-04-22 NOTE — Consult Note (Signed)
Reason for Consult: Infection  Referring Physician: Dr. Brendia Sacksaniel Goodrich, MD  Richard Pittman is an 51 y.o. male.  HPI: 51 year old male with past medical history significant for type 2 diabetes and previous right foot infection, surgery with amputation present to the emergency department from clinic for worsening pus, drainage and infection of the right foot. MRI did reveal osteomyelitis and abscess.  Podiatry consulted for further management.    Past Medical History:  Diagnosis Date   Skin abnormalities    right foot diabetic ulcer with gangrene wife does dressing change qday   Type 2 DM    checks cbg tid last blood glucose today was 198   Uses walker     Past Surgical History:  Procedure Laterality Date   ABDOMINAL AORTOGRAM W/LOWER EXTREMITY Bilateral 01/17/2021   Procedure: ABDOMINAL AORTOGRAM W/LOWER EXTREMITY;  Surgeon: Leonie DouglasHawken, Thomas N, MD;  Location: [facility];  Service: Cardiovascular;  Laterality: Bilateral;   AMPUTATION TOE Right 01/10/2021   Procedure: 4TH TOE AMPUTATION;  Surgeon: Park LiterPrice, Michael J, DPM;  Location: [facility];  Service: Podiatry;  Laterality: Right;   AMPUTATION TOE Right 02/12/2021   Procedure: PARTIAL AMPUTATION GREAT TOE;  Surgeon: Park LiterPrice, Michael J, DPM;  [facility] [place];  Service: Podiatry;  Laterality: Right;   CATARACT EXTRACTION Bilateral 07/2019   INCISION AND DRAINAGE Right 01/08/2021   Procedure: INCISION AND DRAINAGE;  Surgeon: Park LiterPrice, Michael J, DPM;  Location: [facility];  Service: Podiatry;  Laterality: Right;   WOUND DEBRIDEMENT Right 01/10/2021   Procedure: DEBRIDEMENT WOUND;  Surgeon: Park LiterPrice, Michael J, DPM;  Location: [facility];  Service: Podiatry;  Laterality: Right;   WOUND DEBRIDEMENT Right 02/12/2021   Procedure: DEBRIDEMENT WOUND;  Surgeon: Park LiterPrice, Michael J, DPM;  [facility] [place];  Service: Podiatry;  Laterality: Right;    Family History  Problem Relation Age of Onset   Diabetes  Mother     Social History:  reports that he has never smoked. He has never used smokeless tobacco. He reports that he does not currently use alcohol. He reports that he does not use drugs.  Allergies: No Known Allergies  Medications: I have reviewed the patient's current medications.  Results for orders placed or performed during the hospital encounter of 04/21/21 (from the past 48 hour(s))  Basic metabolic panel     Status: Abnormal   Collection Time: 04/21/21 12:27 PM  Result Value Ref Range   Sodium 137 135 - 145 mmol/L   Potassium 5.2 (H) 3.5 - 5.1 mmol/L   Chloride 105 98 - 111 mmol/L   CO2 26 22 - 32 mmol/L   Glucose, Bld 215 (H) 70 - 99 mg/dL    Comment: Glucose reference range applies only to samples taken after fasting for at least 8 hours.   BUN 27 (H) 6 - 20 mg/dL   Creatinine, Ser 4.781.16 0.61 - 1.24 mg/dL   Calcium 9.3 8.9 - 29.510.3 mg/dL   GFR, Estimated >62>60 >13>60 mL/min    Comment: (NOTE) Calculated using the CKD-EPI Creatinine Equation (2021)    Anion gap 6 5 - 15    Comment: Performed at [facility], [address] [place], [place] [id number]  CBC with Differential     Status: Abnormal   Collection Time: 04/21/21 12:27 PM  Result Value Ref Range   WBC 8.4 4.0 - 10.5 K/uL   RBC 5.48 4.22 - 5.81 MIL/uL   Hemoglobin 14.3 13.0 - 17.0 g/dL  HCT 43.4 39.0 - 52.0 %   MCV 79.2 (L) 80.0 - 100.0 fL   MCH 26.1 26.0 - 34.0 pg   MCHC 32.9 30.0 - 36.0 g/dL   RDW 01.7 49.4 - 49.6 %   Platelets 178 150 - 400 K/uL   nRBC 0.0 0.0 - 0.2 %   Neutrophils Relative % 56 %   Neutro Abs 4.7 1.7 - 7.7 K/uL   Lymphocytes Relative 27 %   Lymphs Abs 2.3 0.7 - 4.0 K/uL   Monocytes Relative 4 %   Monocytes Absolute 0.4 0.1 - 1.0 K/uL   Eosinophils Relative 11 %   Eosinophils Absolute 0.9 (H) 0.0 - 0.5 K/uL   Basophils Relative 2 %   Basophils Absolute 0.2 (H) 0.0 - 0.1 K/uL   Immature Granulocytes 0 %   Abs Immature Granulocytes 0.02 0.00 - 0.07 K/uL    Comment:  Performed at [facility], [address], [place], [place] [address]  POC CBG, ED     Status: Abnormal   Collection Time: 04/21/21 12:29 PM  Result Value Ref Range   Glucose-Capillary 192 (H) 70 - 99 mg/dL    Comment: Glucose reference range applies only to samples taken after fasting for at least 8 hours.  Sedimentation rate     Status: None   Collection Time: 04/21/21  1:50 PM  Result Value Ref Range   Sed Rate 10 0 - 16 mm/hr    Comment: Performed at [facility], [address], [place], [place] [address]  C-reactive protein     Status: None   Collection Time: 04/21/21  1:50 PM  Result Value Ref Range   CRP <0.5 <1.0 mg/dL    Comment: Performed at [facility], [address], [place], [place] [address]  Glucose, capillary     Status: Abnormal   Collection Time: 04/21/21  5:58 PM  Result Value Ref Range   Glucose-Capillary 148 (H) 70 - 99 mg/dL    Comment: Glucose reference range applies only to samples taken after fasting for at least 8 hours.  Glucose, capillary     Status: Abnormal   Collection Time: 04/21/21  9:17 PM  Result Value Ref Range   Glucose-Capillary 228 (H) 70 - 99 mg/dL    Comment: Glucose reference range applies only to samples taken after fasting for at least 8 hours.  Basic metabolic panel     Status: Abnormal   Collection Time: 04/22/21  4:29 AM  Result Value Ref Range   Sodium 134 (L) 135 - 145 mmol/L   Potassium 4.2 3.5 - 5.1 mmol/L    Comment: DELTA CHECK NOTED   Chloride 102 98 - 111 mmol/L   CO2 25 22 - 32 mmol/L   Glucose, Bld 161 (H) 70 - 99 mg/dL    Comment: Glucose reference range applies only to samples taken after fasting for at least 8 hours.   BUN 27 (H) 6 - 20 mg/dL   Creatinine, Ser 7.01 0.61 - 1.24 mg/dL   Calcium 8.6 (L) 8.9 - 10.3 mg/dL   GFR, Estimated >77 >93 mL/min    Comment: (NOTE) Calculated using the CKD-EPI Creatinine Equation (2021)    Anion gap 7 5 - 15    Comment: Performed at  [facility], [address], [place], [place] [address]  CBC     Status: Abnormal   Collection Time: 04/22/21  4:29 AM  Result Value Ref Range   WBC 7.4  4.0 - 10.5 K/uL   RBC 4.78 4.22 - 5.81 MIL/uL   Hemoglobin 12.5 (L) 13.0 - 17.0 g/dL   HCT 20.9 (L) 47.0 - 96.2 %   MCV 79.3 (L) 80.0 - 100.0 fL   MCH 26.2 26.0 - 34.0 pg   MCHC 33.0 30.0 - 36.0 g/dL   RDW 83.6 62.9 - 47.6 %   Platelets 155 150 - 400 K/uL   nRBC 0.0 0.0 - 0.2 %    Comment: Performed at [facility], [address], [place], [place] [address]  Prealbumin     Status: None   Collection Time: 04/22/21  4:29 AM  Result Value Ref Range   Prealbumin 20.9 18 - 38 mg/dL    Comment: Performed at [facility], [address], [place], [place] [address]  Glucose, capillary     Status: Abnormal   Collection Time: 04/22/21  7:30 AM  Result Value Ref Range   Glucose-Capillary 181 (H) 70 - 99 mg/dL    Comment: Glucose reference range applies only to samples taken after fasting for at least 8 hours.    MR FOOT RIGHT W WO CONTRAST  Result Date: 04/21/2021 CLINICAL DATA:  Chronic great toe pain. EXAM: MRI OF THE RIGHT FOREFOOT WITHOUT AND WITH CONTRAST TECHNIQUE: Multiplanar, multisequence MR imaging of the right forefoot was performed before and after the administration of intravenous contrast. CONTRAST:  17mL GADAVIST GADOBUTROL 1 MMOL/ML IV SOLN COMPARISON:  Right foot x-rays from same day. MRI right foot dated January 07, 2021. FINDINGS: Bones/Joint/Cartilage Prior partial amputation of the great toe with marrow edema, cortical irregularity, and early decreased T1 marrow signal involving the residual first proximal phalanx. There is also abnormal marrow edema with corresponding patchy decreased T1 marrow signal involving the third phalanges. Mild marrow edema in the fourth and fifth metatarsal heads. Prior amputation of the fourth toe. No fracture or dislocation. Joint spaces are preserved.  No joint effusion. Muscles and Tendons Flexor and extensor tendons are intact. Increased T2 signal within and atrophy of the intrinsic muscles of the forefoot, nonspecific, but likely related to diabetic muscle changes. Soft tissue Diffuse soft tissue swelling and enhancement of the forefoot. Soft tissue ulceration at the tip of third toe extending to bone. 1.8 x 1.5 x 2.2 cm rim enhancing fluid collection along the plantar aspect of the residual first proximal phalanx, draining to the skin surface dorsally. Deep soft tissue ulcer along the plantar base of the fifth toe. IMPRESSION: 1. 2.2 cm abscess along the plantar aspect of the residual great toe, draining to the skin surface dorsally. Underlying osteomyelitis of the residual first proximal phalanx. 2. Soft tissue ulceration at the tip of the third toe extending to bone with underlying osteomyelitis of the third phalanges. 3. Deep soft tissue ulcer along the plantar base of the fifth toe in close proximity to the fourth and fifth metatarsal heads, which demonstrate mild marrow edema, concerning for early osteomyelitis. 4. Diffuse soft tissue swelling and enhancement of the forefoot, consistent with cellulitis. Electronically Signed   By: Obie Dredge M.D.   On: 04/21/2021 15:42   DG Foot Complete Right  Result Date: 04/21/2021 CLINICAL DATA:  Nonhealing wound right first toe EXAM: RIGHT FOOT COMPLETE - 3+ VIEW COMPARISON:  04/10/2021 outside right toe radiographs FINDINGS: Diffuse soft tissues thickening, most prominent in the right first toe. Indistinct distal cortical margin in the proximal phalanx right first toe, favored to represent slightly increased erosive change in the interval.  No additional sites of erosion. Previous right fourth toe amputation. No fracture or dislocation. No aggressive appearing focal osseous lesions. Lisfranc joint appears intact. No radiopaque foreign bodies. No soft tissue gas. IMPRESSION: 1. Diffuse soft tissues thickening,  most prominent in the right first toe. Indistinct distal cortical margin in the proximal phalanx right first toe, favored to represent slightly increased erosive change in the interval. Acute osteomyelitis not excluded. 2. Previous right fourth toe amputation. Electronically Signed   By: Delbert Phenix M.D.   On: 04/21/2021 13:05    Review of Systems Blood pressure 137/67, pulse 72, temperature 98.6 F (37 C), temperature source Oral, resp. rate 18, height 5\' 8"  (1.727 m), weight 91.6 kg, SpO2 99 %. Physical Exam General: AAO x3, NAD  Dermatological: Skin is warm, dry and supple bilateral. Nails x 10 are well manicured; remaining integument appears unremarkable at this time. There are no open sores, no preulcerative lesions, no rash or signs of infection present.       Vascular: Dorsalis Pedis artery and Posterior Tibial artery pedal pulses are 2/4 bilateral with immedate capillary fill time. There is no pain with calf compression, swelling, warmth, erythema.   Neruologic: Sensation decreased.   Musculoskeletal: Hammertoe contractures noted at the remaining digits.   Assessment/Plan: Patient seen evaluated bedside with his wife present and via translator [phone]).  The patient's son is also available on the phone.  I had extensive conversation with the patient, family in regards to the clinical findings as well as MRI findings that reveal abscess, osteomyelitis.  I have recommended transmetatarsal amputation on the right side.  We did discuss conservative options as well clearing long-term antibiotics and incision and drainage, bone biopsy.  My recommendation is the amputation given the infection as well as history of previous to amputations and the digital contractures.  After long discussion regarding the both the pros and cons of both options they are in agreement to proceed with a transmetatarsal amputation.  We discussed all alternatives, risks, complications including spread of  infection, bleeding, further amputation as well as general risks of surgery.  We discussed the postoperative course as well.  We will plan for surgery hopefully later this evening or tomorrow afternoon pending OR availability.  N.p.o. for now.  I spent 1 hour with the patient including 45 minutes in face-to-face discussion regarding the findings, options, and surgical intervention.   #[id number], DPM  Richard Pittman 04/22/2021, 7:46 AM

## 2021-04-22 NOTE — Transfer of Care (Signed)
Immediate Anesthesia Transfer of Care Note ? ?Patient: Richard Pittman ? ?Procedure(s) Performed: Procedure(s): ?TRANSMETATARSAL AMPUTATION (Right) ? ?Patient Location: PACU  ? ?Anesthesia Type:MAC ? ?Level of Consciousness: awake, alert  and oriented ? ?Airway & Oxygen Therapy: Patient Spontanous Breathing and Patient connected to nasal cannula oxygen ? ?Post-op Assessment: Report given to RN and Post -op Vital signs reviewed and stable ? ?Post vital signs: Reviewed and stable ? ?Last Vitals:  ?Vitals:  ? 04/22/21 1738 04/22/21 1743  ?BP: 137/68 (!) 149/74  ?Pulse: 81 84  ?Resp: (!) 23 13  ?Temp:    ?SpO2: 100% 99%  ? ? ?Complications: No apparent anesthesia complications ? ?

## 2021-04-22 NOTE — Progress Notes (Signed)
Assisted Dr. Druscilla Brownie with Right Popliteal block. Side rails up, monitors on throughout procedure. See vital signs in flow sheet. Tolerated Procedure well. ? ?

## 2021-04-22 NOTE — Interval H&P Note (Signed)
History and Physical Interval Note: ? ?04/22/2021 ?5:40 PM ? ?Richard Pittman  has presented today for surgery, with the diagnosis of infection right foot.  The various methods of treatment have been discussed with the patient and family. After consideration of risks, benefits and other options for treatment, the patient has consented to  Procedure(s): ?TRANSMETATARSAL AMPUTATION (Right) as a surgical intervention.  The patient's history has been reviewed, patient examined, no change in status, stable for surgery.  I have reviewed the patient's chart and labs.  Questions were answered to the patient's satisfaction.   ? ? ?Vivi Barrack ? ? ?

## 2021-04-23 ENCOUNTER — Encounter (HOSPITAL_COMMUNITY): Payer: Self-pay | Admitting: Podiatry

## 2021-04-23 DIAGNOSIS — M86171 Other acute osteomyelitis, right ankle and foot: Secondary | ICD-10-CM | POA: Diagnosis present

## 2021-04-23 DIAGNOSIS — Z89431 Acquired absence of right foot: Secondary | ICD-10-CM

## 2021-04-23 LAB — GLUCOSE, CAPILLARY
Glucose-Capillary: 177 mg/dL — ABNORMAL HIGH (ref 70–99)
Glucose-Capillary: 205 mg/dL — ABNORMAL HIGH (ref 70–99)
Glucose-Capillary: 186 mg/dL — ABNORMAL HIGH (ref 70–99)
Glucose-Capillary: 204 mg/dL — ABNORMAL HIGH (ref 70–99)
Glucose-Capillary: 204 mg/dL — ABNORMAL HIGH (ref 70–99)

## 2021-04-23 LAB — CREATININE, SERUM
Creatinine, Ser: 1.14 mg/dL (ref 0.61–1.24)
GFR, Estimated: >60 mL/min (ref >60)

## 2021-04-23 MED ORDER — FERROUS SULFATE 325 (65 FE) MG PO TABS
325.0000 mg | ORAL_TABLET | Freq: Every day | ORAL | Status: DC
  Administered 2021-04-24: 08:00:00 325 mg via ORAL
  Filled 2021-04-23: qty 1

## 2021-04-23 NOTE — Assessment & Plan Note (Signed)
Body mass index is 30.71 kg/m?Marland Kitchen  ?Placing the pt at higher risk of poor outcomes.  ?

## 2021-04-23 NOTE — Progress Notes (Signed)
Subjective: ?POD # 1 s/p right TMA. States he has been having some throbbing sensation of the foot with the big toe used to be.  Recently got Tylenol which helped the pain.,  Denies any fevers or chills.  No chest pain or shortness of breath.  No other concerns. ?  ? ?Objective: ?AAO x3, NAD ?DP/PT pulses palpable bilaterally, CRT less than 3 seconds ?Status post right foot transmetatarsal amputation.  Incision intact with sutures without any dehiscence.  There is some mild swelling present.  There is no drainage or pus noted.  No fluctuation crepitation.  See pictures below.   ?No pain with calf compression, swelling, warmth, erythema ? ? ? ? ? ? ? ? ?Assessment: ?POD # 1 s/p right TMA ? ?Plan: ?Incision healing well at this time postoperatively.  Betadine was applied followed by Xeroform, dry sterile dressing.  He can keep the dressing clean, dry, intact until follow-up in the office next week.  Continue nonweightbearing.  Wound culture still pending.  Intraoperatively proximal margins appear to be viable.  From podiatry standpoint is able to be discharged possibly tomorrow pending culture as I like to send him home with at least 1 week of oral antibiotics for any soft tissue infection.  I will make arrangements for follow-up in the office next week. ? ?Ovid Curd, DPM ? ?

## 2021-04-23 NOTE — Assessment & Plan Note (Signed)
Patient has mildly low MCV. ?Hemoglobin also mildly low. ?No active bleeding. ?Most likely in the setting of osteomyelitis causing anemia of chronic disease. ?Initiate iron therapy. ?

## 2021-04-23 NOTE — Assessment & Plan Note (Signed)
Presented from the podiatry clinic for worsening purulent drainage in the right foot. ?Afebrile.  No leukocytosis.  ESR CRP normal. ?Blood cultures so far negative. ?MRI shows 2 cm abscess along with osteomyelitis of multiple bones and cellulitis of the forefoot. ?Podiatry was consulted. ?Patient was recommended to undergo transmetatarsal amputation, Which was performed on 3/7. ?Treated with IV vancomycin and Zosyn.  Currently being continued. ?If cultures remain negative will transition to oral doxycycline on Augmentin for a total of 7 days after surgery as recommended by podiatry. ?

## 2021-04-23 NOTE — Assessment & Plan Note (Signed)
Hemoglobin A1c 8.8 in November 2022. ?On metformin and glipizide at home. ?We will recheck hemoglobin A1c, if not well controlled, will recommend modification of therapy. ?Continue sliding scale insulin for now. ?

## 2021-04-23 NOTE — Progress Notes (Signed)
Mobility Specialist - Progress Note ? ? ? 04/23/21 1032  ?Mobility  ?Activity Ambulated with assistance in room  ?Level of Assistance Standby assist, set-up cues, supervision of patient - no hands on  ?Assistive Device Front wheel walker  ?RLE Weight Bearing NWB  ?Distance Ambulated (ft) 20 ft  ?Activity Response Tolerated well  ?$Mobility charge 1 Mobility  ? ?Pt agreeable to mobilize this morning, stated he ambulated in hallway, but was agreeable to ambulate in room. Pt ambulated 20 ft in room using RW and was aware of NWB precautions on R foot. No c/o of pain, dizziness, or SOB. Pt stated he has been NWB on R foot for 3 months and feels comfortable using RW and crutches while ambulating. Left pt in bed after session with call bell at side.  ? ?Joan Avetisyan S. ?Mobility Specialist ?Acute Rehabilitation Services ?Phone: [phone] ?04/23/21, 10:35 AM ? ? ? ? ?

## 2021-04-23 NOTE — Progress Notes (Signed)
?Progress Note ? ? ?Patient: Richard Pittman AYT:[id number] DOB: August 20, 1970 DOA: 04/21/2021     Hospitalization day: 2 ?DOS: the patient was seen and examined on 04/23/2021 ?  ?Brief hospital course: ?50-y/o, PMH of type II DM, previous right toe amputation presented to ED from podiatry office due to purulent drainage and pain. ?Admitted for right foot abscess and osteomyelitis. ?3/7 underwent transmetatarsal amputation.  Treated with IV vancomycin and Zosyn. ? ?Assessment and Plan: ?* Acute osteomyelitis of phalanges of right foot,  2.2 cm abscess and cellulitis of forefoot ?Presented from the podiatry clinic for worsening purulent drainage in the right foot. ?Afebrile.  No leukocytosis.  ESR CRP normal. ?Blood cultures so far negative. ?MRI shows 2 cm abscess along with osteomyelitis of multiple bones and cellulitis of the forefoot. ?Podiatry was consulted. ?Patient was recommended to undergo transmetatarsal amputation, Which was performed on 3/7. ?Treated with IV vancomycin and Zosyn.  Currently being continued. ?If cultures remain negative will transition to oral doxycycline on Augmentin for a total of 7 days after surgery as recommended by podiatry. ? ?Uncontrolled type 2 diabetes mellitus with hyperglycemia, without long-term current use of insulin ([place]) ?Hemoglobin A1c 8.8 in November 2022. ?On metformin and glipizide at home. ?We will recheck hemoglobin A1c, if not well controlled, will recommend modification of therapy. ?Continue sliding scale insulin for now. ? ?S/P transmetatarsal amputation of foot, right ([place]) ?Performed on 3/7, management per podiatry. ?Podiatry will change Band-Aid on Thursday. ?Nonweightbearing.  Patient ambulating with crutches. ?Pain management per podiatry. ? 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 ?Hemoglobin also mildly low. ?No active bleeding. ?Most likely in the setting of osteomyelitis causing anemia of chronic disease. ?Initiate iron  therapy. ? ?Obesity (BMI 30-39.9) ?Body mass index is 30.71 kg/m?Marland Kitchen  ?Placing the pt at higher risk of poor outcomes.  ? ?Hyperkalemia-resolved as of 04/22/2021 ?resolved. ? ?Subjective: Patient's son provided interpretation via phone.  Patient provided permission for this.  Denies any acute complaint.  No nausea no vomiting.  Reported numbness started postoperatively, involving right foot below the knee improving now to only involve the right foot.  No nausea no vomiting.  No diarrhea or constipation. ? ?Physical Exam: ?Vitals:  ? 04/23/21 0142 04/23/21 0539 04/23/21 0733 04/23/21 1335  ?BP: 122/70 125/64 (!) 144/76 (!) 156/80  ?Pulse: 73 74 78 77  ?Resp: _0 ?Temp: 98 ?F (36.7 ?C) 98.4 ?F (36.9 ?C) 98 ?F (36.7 ?C) 98.9 ?F (37.2 ?C)  ?TempSrc: Oral Oral Oral   ?SpO2: 98% 97% 100% 96%  ?Weight:      ?Height:      ? ?General: Appear in mild distress; no visible Abnormal Neck Mass Or lumps, Conjunctiva normal ?Cardiovascular: S1 and S2 Present, no Murmur, ?Respiratory: good respiratory effort, Bilateral Air entry present and CTA, no Crackles, no wheezes ?Abdomen: Bowel Sound present ?Extremities: no Pedal edema, right foot wrapped ?Neurology: alert and oriented to time, place, and person ?Gait not checked due to patient safety concerns  ? ?Data Reviewed: ? I have Reviewed nursing notes, Vitals, and Lab results since pt's last encounter. Pertinent lab results un control CBG, normal creatinine ?I have ordered test including CBC, BMP, iron studies, hemoglobin A1c ?I have reviewed the last note from podiatry,   ? ?Family Communication: Wife at bedside. ? ?Disposition: ?Status is: Inpatient ?Remains inpatient appropriate because: Pending wound culture, podiatry will also evaluate the patient on Thursday for wound recheck. ? ?Author: ?Berle Mull, MD ?04/23/2021  3:48 PM ? ?For on call review [email or web address]. ?

## 2021-04-23 NOTE — Assessment & Plan Note (Addendum)
Performed on 3/7, management per podiatry. ?Podiatry will change Band-Aid on Thursday. ?Nonweightbearing.  Patient ambulating with crutches. ?Pain management per podiatry. ?

## 2021-04-24 LAB — GLUCOSE, CAPILLARY
Glucose-Capillary: 186 mg/dL — ABNORMAL HIGH (ref 70–99)
Glucose-Capillary: 169 mg/dL — ABNORMAL HIGH (ref 70–99)
Glucose-Capillary: 256 mg/dL — ABNORMAL HIGH (ref 70–99)

## 2021-04-24 LAB — CBC
HCT: 34.8 % — ABNORMAL LOW (ref 39.0–52.0)
Hemoglobin: 11.6 g/dL — ABNORMAL LOW (ref 13.0–17.0)
MCH: 26.4 pg (ref 26.0–34.0)
MCHC: 33.3 g/dL (ref 30.0–36.0)
MCV: 79.1 fL — ABNORMAL LOW (ref 80.0–100.0)
Platelets: 134 10*3/uL — ABNORMAL LOW (ref 150–400)
RBC: 4.4 MIL/uL (ref 4.22–5.81)
RDW: 13.2 % (ref 11.5–15.5)
WBC: 8 10*3/uL (ref 4.0–10.5)
nRBC: 0 % (ref 0.0–0.2)

## 2021-04-24 LAB — BASIC METABOLIC PANEL
Anion gap: 7 (ref 5–15)
BUN: 16 mg/dL (ref 6–20)
CO2: 23 mmol/L (ref 22–32)
Calcium: 8.8 mg/dL — ABNORMAL LOW (ref 8.9–10.3)
Chloride: 105 mmol/L (ref 98–111)
Creatinine, Ser: 1.18 mg/dL (ref 0.61–1.24)
GFR, Estimated: >60 mL/min (ref >60)
Glucose, Bld: 201 mg/dL — ABNORMAL HIGH (ref 70–99)
Potassium: 3.9 mmol/L (ref 3.5–5.1)
Sodium: 135 mmol/L (ref 135–145)

## 2021-04-24 LAB — WOUND CULTURE
MICRO NUMBER:: 13092110
SPECIMEN QUALITY:: ADEQUATE

## 2021-04-24 LAB — HEMOGLOBIN A1C
Hgb A1c MFr Bld: 7.3 % — ABNORMAL HIGH (ref 4.8–5.6)
Mean Plasma Glucose: 162.81 mg/dL

## 2021-04-24 LAB — SURGICAL PATHOLOGY

## 2021-04-24 MED ORDER — SACCHAROMYCES BOULARDII 250 MG PO CAPS
250.0000 mg | ORAL_CAPSULE | Freq: Two times a day (BID) | ORAL | Status: DC
  Administered 2021-04-24: 12:00:00 250 mg via ORAL
  Filled 2021-04-24: qty 1

## 2021-04-24 MED ORDER — DOXYCYCLINE HYCLATE 100 MG PO TABS: 100.0000 mg | ORAL_TABLET | Freq: Two times a day (BID) | ORAL | 0 refills | Status: DC

## 2021-04-24 MED ORDER — CIPROFLOXACIN HCL 500 MG PO TABS: 500.0000 mg | ORAL_TABLET | Freq: Two times a day (BID) | ORAL | 0 refills | Status: DC

## 2021-04-24 MED ORDER — SACCHAROMYCES BOULARDII 250 MG PO CAPS
250.0000 mg | ORAL_CAPSULE | Freq: Two times a day (BID) | ORAL | 0 refills | Status: AC
Start: 2021-04-24 — End: 2021-05-08

## 2021-04-24 MED ORDER — CIPROFLOXACIN HCL 500 MG PO TABS
500.0000 mg | ORAL_TABLET | Freq: Two times a day (BID) | ORAL | Status: DC
  Administered 2021-04-24: 12:00:00 500 mg via ORAL
  Filled 2021-04-24: qty 1

## 2021-04-24 MED ORDER — GABAPENTIN 100 MG PO CAPS: 100.0000 mg | ORAL_CAPSULE | Freq: Two times a day (BID) | ORAL | 0 refills | Status: AC

## 2021-04-24 MED ORDER — DOXYCYCLINE HYCLATE 100 MG PO TABS: 100.0000 mg | ORAL_TABLET | Freq: Two times a day (BID) | ORAL | Status: DC

## 2021-04-24 NOTE — Progress Notes (Addendum)
MD came to consult w/ pt about PO antix that will be used to treat current infection on RLE @ home.  ?

## 2021-04-24 NOTE — Progress Notes (Signed)
Mobility Specialist - Progress Note ? ? ? 04/24/21 0928  ?Mobility  ?Activity Ambulated independently in room  ?Level of Assistance Independent after set-up  ?Assistive Device Front wheel walker  ?RLE Weight Bearing NWB  ?Distance Ambulated (ft) 25 ft  ?Activity Response Tolerated well  ?$Mobility charge 1 Mobility  ? ?Pt required encouragement to ambulate today, refused ambulation in hallway, but agreeable to in room mobility. Pt reported 2/10 pain this morning. Used RW to ambulate 25 ft in room, maintained NWB restriction on R foot. Pt refused to sit in chair and returned to bed at EOS. Left in bed with call bell in reach and family in room.  ? ?Richard Pittman S. ?Mobility Specialist ?Acute Rehabilitation Services ?Phone: [phone] ?04/24/21, 9:31 AM ? ? ? ?

## 2021-04-24 NOTE — Plan of Care (Signed)

## 2021-04-24 NOTE — Progress Notes (Signed)
Subjective: ?POD # 2 s/p right TMA.  States he is doing well.  He has no fevers or chills.  No chest pain or shortness of breath.  Pain is controlled with Tylenol. ? ?Objective: ?AAO x3, NAD ?DP/PT pulses palpable bilaterally, CRT less than 3 seconds ?Dressing clean, dry, intact.  ?No pain with calf compression, swelling, warmth, erythema ? ?Assessment: ?POD # 2 s/p right TMA ? ?Plan: ?WBC 8.0, afebrile. Pathology confirmed osteomyelitis. Wound culture from the OR shoes staph aureus, still prelim. from my standpoint he is able to be discharged with oral antibiotics.  Will discharge with doxycycline, Cipro and follow cultures as an outpatient if needed just and I can work on this for him.  He has a follow-up to see me on Tuesday.  He is nonweightbearing and stressed the importance of this.  Continue elevation.  Tylenol, gabapentin for pain control.  Return precautions discussed. ? ?Spoke with him via translator today. ? ?Ovid Curd, DPM ? ?

## 2021-04-25 NOTE — Op Note (Signed)
PATIENT:  Richard Pittman  51 y.o. male ?  ?PRE-OPERATIVE DIAGNOSIS:  infection right foot ?  ?POST-OPERATIVE DIAGNOSIS:  infection right foot ?  ?PROCEDURE:  Procedure(s): ?TRANSMETATARSAL AMPUTATION (Right) ?  ?SURGEON:  Surgeon(s) and Role: ?   Trula Slade, DPM - Primary ?  ?PHYSICIAN ASSISTANT:  ?  ?ASSISTANTS: none  ?  ?ANESTHESIA:   general ?  ?EBL:  30 mL  ?  ?BLOOD ADMINISTERED:none ?  ?DRAINS: none  ?  ?LOCAL MEDICATIONS USED:  NONE ?  ?SPECIMEN:  Source of Specimen:  foot for pathology, wound cultures ?  ?DISPOSITION OF SPECIMEN:  PATHOLOGY ?  ?COUNTS:  YES ?  ?TOURNIQUET:   ?Total Tourniquet Time Documented: ?Calf (Right) - 32 minutes ?Total: Calf (Right) - 32 minutes ?  ?  ?DICTATION: .Dragon Dictation ?  ?PLAN OF CARE: Admit to inpatient  ?  ?PATIENT DISPOSITION:  PACU - hemodynamically stable. ?  ?Delay start of Pharmacological VTE agent (>24hrs) due to surgical blood loss or risk of bleeding: no ? ?Indications for surgery: ?51 year old male was admitted to the hospital for concerns of worsening wounds on his right foot. There was mild purulence coming from the ulcers and MRI of his foot which did show osteomyelitis.  Given the infection as well as previous amputations I discussed with the patient and his family in extensive detail the options including trying to salvage the remaining toes versus amputation.  After long discussion positive proceed with transmetatarsal amputation.  All alternatives, risks, complications were discussed.  No promises or guarantees were given aspect of the procedure.  All questions were answered to the best my ability. ? ?Procedure in detail: ?Patient was both verbally and visually identified by myself and nursing staff and the anesthesia staff preoperatively.  He was transferred to the operating room via stretcher.  Apparently he did have a nerve block performed by the anesthesia staff.  Well-padded calf tourniquet was applied making sure to pad all  bony prominences.  The right lower extremities and scrubbed, prepped, draped in normal sterile fashion. ? ?Timeout was performed.  At this time an incision was planned along the aspect of the metatarsals dorsally with the plantar flap.  The incision was made with a 10 blade scalpel from skin to bone wound electrocautery was utilized in order to help with hemostasis and dissection as well.  Given he was found of active bleeding I did not put the tourniquet at this time to 250 mmHg.  At this time a key elevator was utilized to remove soft tissue from the metatarsals.  Sagittal saw was utilized to resect the metatarsals.  Once this was completed the plantar flap was dissected and the forefoot was disarticulated and passed off the table.  There is no to be any significant proximal infection or purulence.  I further used a sagittal saw to remodel the metatarsals in order to have enough skin closure.  Remaining metatarsals appear to be viable.  They were hard in nature, white in color.  The incision was copiously irrigated with saline and hemostasis was achieved.  The incision was then closed with Prolene in a simple interrupted suture fashion.  Betadine at this point of the incision followed by Xeroform and dry sterile dressing.  Tourniquet was released.  He was awoken from anesthesia and found to tolerate the procedure well any complications.  He was transferred to PACU vital signs stable vascular status intact. ? ?Postoperative course: ?Patient remain inpatient on IV antibiotics I will  plan to change the dressing prior to discharge.  He will likely be discharged home with 1 week of oral antibiotics for any residual soft tissue infection.  He is nonweightbearing.  Follow-up in the office early next week with close monitoring.  ?

## 2021-04-26 LAB — AEROBIC/ANAEROBIC CULTURE W GRAM STAIN (SURGICAL/DEEP WOUND): Gram Stain: NONE SEEN

## 2021-04-27 NOTE — Discharge Summary (Signed)
Physician Discharge Summary   Patient: Richard Pittman MRN: [id number] DOB: 09-May-1970  Admit date:     04/21/2021  Discharge date: 04/24/2021  Discharge Physician: Berle Mull  PCP: Cathleen Corti, PA-C  Recommendations at discharge: Follow-up with podiatry as recommended.  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 Admitted for right foot abscess and osteomyelitis. 3/7 underwent transmetatarsal amputation.  Treated with IV vancomycin and Zosyn.  Assessment and Plan: * Acute osteomyelitis of phalanges of right foot,  2.2 cm abscess and cellulitis of forefoot Presented from the podiatry clinic for worsening purulent drainage in the right foot. Afebrile.  No leukocytosis.  ESR CRP normal. Blood cultures so far negative. MRI shows 2 cm abscess along with osteomyelitis of multiple bones and cellulitis of the forefoot. Podiatry was consulted. Patient was recommended to undergo transmetatarsal amputation, Which was performed on 3/7. Treated with IV vancomycin and Zosyn.  Staph aureus in the wound as well as Prevotella.  Patient currently covered with doxycycline and ciprofloxacin on discharge. Outpatient follow-up with podiatry.  Uncontrolled type 2 diabetes mellitus with hyperglycemia, without long-term current use of insulin (HCC) Hemoglobin A1c 8.8 in November 2022. On metformin and glipizide at home.  Hemoglobin A1c improving. Continue current regimen.    S/P transmetatarsal amputation of foot,  right ([place]) Performed on 3/7, management per podiatry. Podiatry will change Band-Aid on Thursday. Nonweightbearing.  Patient ambulating with crutches. Pain management per podiatry.  Add gabapentin.  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 Hemoglobin also mildly low. No active bleeding. Most likely in the setting of osteomyelitis causing anemia of chronic disease.  Obesity (BMI 30-39.9) Body mass index is 30.71 kg/m.  Placing the pt at higher risk of poor outcomes.   Hyperkalemia-resolved as of 04/22/2021 resolved.  Consultants: Podiatry Procedures performed:  Amputation transmetatarsal of right foot  DISCHARGE MEDICATION: Allergies as of 04/24/2021   No Known Allergies      Medication List     STOP taking these medications    cephALEXin 500 MG capsule Commonly known as: KEFLEX       TAKE these medications    ciprofloxacin 500 MG tablet Commonly known as: CIPRO Take 1 tablet (500 mg total) by mouth 2 (two) times daily for 7 days.   doxycycline 100 MG tablet Commonly known as: VIBRA-TABS Take 1 tablet (100 mg total) by mouth 2 (two) times daily for 7 days.   ferrous sulfate 325 (65 FE) MG tablet Take 1 tablet (325 mg total) by mouth daily.   gabapentin 100 MG capsule Commonly known as: Neurontin Take 1 capsule (100 mg total) by mouth 2 (two) times daily.   glipiZIDE 10 MG tablet Commonly known as: GLUCOTROL Take 10 mg by mouth 2 (two) times daily before a meal.   metFORMIN 1000 MG (MOD) 24 hr tablet Commonly known as: GLUMETZA Take 1,000 mg by mouth 2 (two) times daily with a meal.   saccharomyces boulardii 250 MG capsule Commonly known as: FLORASTOR Take 1 capsule (250 mg total) by  mouth 2 (two) times daily for 14 days.               Discharge Care Instructions  (From admission, onward)           Start     Ordered   04/24/21 0000  Leave dressing on - Keep it clean, dry, and intact until clinic visit         04/24/21 1205            Follow-up Information     Brantley Stage A, PA-C. Schedule an appointment as soon as possible for a visit in 1 week(s).   Specialty: Physician Assistant Contact information: [place] [phone]         Trula Slade, DPM. Schedule an appointment as soon as possible for a visit in 1 week(s).   Specialty: Podiatry Why: For wound re-check Contact information: [place] [phone]                Disposition: Home Diet recommendation:  Discharge Diet Orders (From admission, onward)     Start     Ordered   04/24/21 0000  Diet - low sodium heart healthy        04/24/21 1205           Discharge Exam: Filed Weights   04/21/21 1630 04/22/21 1610  Weight: 91.6 kg 91.6 kg   General: Appear in no distress; no visible Abnormal Neck Mass Or lumps, Conjunctiva normal Cardiovascular: S1 and S2 Present, no Murmur, Respiratory: good respiratory effort, Bilateral Air entry present and CTA, no Crackles, no wheezes Abdomen: Bowel Sound present no Extremities: trace right Pedal edema right leg wrapped. Neurology: alert and oriented to time, place, and person Gait not checked due to patient safety concerns   Condition at discharge: good  The results of significant diagnostics from this hospitalization (including imaging, microbiology, ancillary and laboratory) are listed below for reference.   Imaging Studies: MR FOOT RIGHT W WO CONTRAST  Result Date: 04/21/2021 CLINICAL DATA:  Chronic great toe pain. EXAM: MRI OF THE RIGHT FOREFOOT WITHOUT AND WITH CONTRAST TECHNIQUE: Multiplanar, multisequence MR imaging of the right forefoot was performed before and after the administration of intravenous contrast. CONTRAST:  30m GADAVIST GADOBUTROL 1 MMOL/ML IV SOLN COMPARISON:  Right foot x-rays from same day. MRI right foot dated January 07, 2021. FINDINGS: Bones/Joint/Cartilage  Prior partial amputation of the great toe with marrow edema, cortical irregularity, and early decreased T1 marrow signal involving the residual first proximal phalanx. There is also abnormal marrow edema with corresponding patchy decreased T1 marrow signal involving the third phalanges. Mild marrow edema in the fourth and fifth metatarsal heads. Prior amputation of the fourth toe. No fracture or dislocation. Joint spaces are preserved. No joint effusion. Muscles and Tendons Flexor and extensor tendons are intact. Increased T2 signal within and atrophy of the intrinsic muscles of the forefoot, nonspecific, but likely related to diabetic muscle changes. Soft tissue Diffuse soft tissue swelling and enhancement of the forefoot. Soft tissue ulceration at the tip of third toe extending to bone. 1.8 x 1.5 x 2.2 cm rim enhancing fluid collection along the plantar aspect of the residual first proximal phalanx, draining to the skin surface dorsally. Deep soft tissue ulcer along the plantar base of the fifth toe. IMPRESSION: 1. 2.2 cm abscess along the plantar aspect of the residual great toe, draining to the skin surface dorsally. Underlying osteomyelitis  of the residual first proximal phalanx. 2. Soft tissue ulceration at the tip of the third toe extending to bone with underlying osteomyelitis of the third phalanges. 3. Deep soft tissue ulcer along the plantar base of the fifth toe in close proximity to the fourth and fifth metatarsal heads, which demonstrate mild marrow edema, concerning for early osteomyelitis. 4. Diffuse soft tissue swelling and enhancement of the forefoot, consistent with cellulitis. Electronically Signed   By: Titus Dubin M.D.   On: 04/21/2021 15:42   DG Foot 2 Views Right  Result Date: 04/16/2021 Please see detailed radiograph report in office note.  DG Foot 2 Views Right  Result Date: 04/16/2021 Please see detailed radiograph report in office note.  DG Foot Complete Right  Result Date:  04/22/2021 CLINICAL DATA:  Transmetatarsal amputation. EXAM: RIGHT FOOT COMPLETE - 3+ VIEW COMPARISON:  04/21/2021 FINDINGS: Three-view show amputation at the level of the proximal diaphyses of the metatarsals. No complicating feature seen by radiography. IMPRESSION: Transmetatarsal amputation as above. Electronically Signed   By: Nelson Chimes M.D.   On: 04/22/2021 19:58   DG Foot Complete Right  Result Date: 04/21/2021 CLINICAL DATA:  Nonhealing wound right first toe EXAM: RIGHT FOOT COMPLETE - 3+ VIEW COMPARISON:  04/10/2021 outside right toe radiographs FINDINGS: Diffuse soft tissues thickening, most prominent in the right first toe. Indistinct distal cortical margin in the proximal phalanx right first toe, favored to represent slightly increased erosive change in the interval. No additional sites of erosion. Previous right fourth toe amputation. No fracture or dislocation. No aggressive appearing focal osseous lesions. Lisfranc joint appears intact. No radiopaque foreign bodies. No soft tissue gas. IMPRESSION: 1. Diffuse soft tissues thickening, most prominent in the right first toe. Indistinct distal cortical margin in the proximal phalanx right first toe, favored to represent slightly increased erosive change in the interval. Acute osteomyelitis not excluded. 2. Previous right fourth toe amputation. Electronically Signed   By: Ilona Sorrel M.D.   On: 04/21/2021 13:05    Microbiology: Results for orders placed or performed during the hospital encounter of 04/21/21  Resp Panel by RT-PCR (Flu A&B, Covid) Nasopharyngeal Swab     Status: None   Collection Time: 04/22/21  7:45 AM   Specimen: Nasopharyngeal Swab; Nasopharyngeal(NP) swabs in vial transport medium  Result Value Ref Range Status   SARS Coronavirus 2 by RT PCR NEGATIVE NEGATIVE Final    Comment: (NOTE) SARS-CoV-2 target nucleic acids are NOT DETECTED.  The SARS-CoV-2 RNA is generally detectable in upper respiratory specimens during the  acute phase of infection. The lowest concentration of SARS-CoV-2 viral copies this assay can detect is 138 copies/mL. A negative result does not preclude SARS-Cov-2 infection and should not be used as the sole basis for treatment or other patient management decisions. A negative result may occur with  improper specimen collection/handling, submission of specimen other than nasopharyngeal swab, presence of viral mutation(s) within the areas targeted by this assay, and inadequate number of viral copies(<138 copies/mL). A negative result must be combined with clinical observations, patient history, and epidemiological information. The expected result is Negative.  Fact Sheet for Patients:  EntrepreneurPulse.com.au  Fact Sheet for Healthcare Providers:  IncredibleEmployment.be  This test is no t yet approved or cleared by the [place] FDA and  has been authorized for detection and/or diagnosis of SARS-CoV-2 by FDA under an Emergency Use Authorization (EUA). This EUA will remain  in effect (meaning this test can be used) for the duration of the COVID-19  declaration under Section 564(b)(1) of the Act, 21 U.S.C.section 360bbb-3(b)(1), unless the authorization is terminated  or revoked sooner.       Influenza A by PCR NEGATIVE NEGATIVE Final   Influenza B by PCR NEGATIVE NEGATIVE Final    Comment: (NOTE) The Xpert Xpress SARS-CoV-2/FLU/RSV plus assay is intended as an aid in the diagnosis of influenza from Nasopharyngeal swab specimens and should not be used as a sole basis for treatment. Nasal washings and aspirates are unacceptable for Xpert Xpress SARS-CoV-2/FLU/RSV testing.  Fact Sheet for Patients: EntrepreneurPulse.com.au  Fact Sheet for Healthcare Providers: IncredibleEmployment.be  This test is not yet approved or cleared by the [place] FDA and has been authorized for detection and/or  diagnosis of SARS-CoV-2 by FDA under an Emergency Use Authorization (EUA). This EUA will remain in effect (meaning this test can be used) for the duration of the COVID-19 declaration under Section 564(b)(1) of the Act, 21 U.S.C. section 360bbb-3(b)(1), unless the authorization is terminated or revoked.  Performed at [facility], [place] [address] [place], [place] [address]   Aerobic/Anaerobic Culture w Gram Stain (surgical/deep wound)     Status: None   Collection Time: 04/22/21  6:32 PM   Specimen: Foot, Right; Wound  Result Value Ref Range Status   Specimen Description   Final    WOUND Performed at [facility] [address] [place], [place] [address]    Special Requests   Final    NONE RIGHT FOOT TRANSMETATARSAL AMPUTATION Performed at [facility] [address] [place], [place] [address]    Gram Stain NO WBC SEEN NO ORGANISMS SEEN   Final   Culture   Final    RARE STAPHYLOCOCCUS AUREUS WITHIN MIXED ORGANISMS FEW PREVOTELLA BIVIA BETA LACTAMASE POSITIVE Performed at [facility], [place] [address] [place], [place] [address]    Report Status 04/26/2021 FINAL  Final   Organism ID, Bacteria STAPHYLOCOCCUS AUREUS  Final      Susceptibility   Staphylococcus aureus - MIC*    CIPROFLOXACIN <=0.5 SENSITIVE Sensitive     ERYTHROMYCIN RESISTANT Resistant     GENTAMICIN <=0.5 SENSITIVE Sensitive     OXACILLIN 0.5 SENSITIVE Sensitive     TETRACYCLINE <=1 SENSITIVE Sensitive     VANCOMYCIN 1 SENSITIVE Sensitive     TRIMETH/SULFA <=10 SENSITIVE Sensitive     CLINDAMYCIN RESISTANT Resistant     RIFAMPIN <=0.5 SENSITIVE Sensitive     Inducible Clindamycin POSITIVE Resistant     * RARE STAPHYLOCOCCUS AUREUS    Labs: CBC: Recent Labs  Lab 04/21/21 1227 04/22/21 0429 04/24/21 0413  WBC 8.4 7.4 8.0  NEUTROABS 4.7  --   --   HGB 14.3 12.5* 11.6*  HCT 43.4 37.9* 34.8*  MCV 79.2* 79.3* 79.1*  PLT 178 155 134*    Basic Metabolic Panel: Recent Labs  Lab 04/21/21 1227 04/22/21 0429 04/23/21 0440 04/24/21 0413  NA 137 134*  --  135  K 5.2* 4.2  --  3.9  CL 105 102  --  105  CO2 26 25  --  23  GLUCOSE 215* 161*  --  201*  BUN 27* 27*  --  16  CREATININE 1.16 1.06 1.14 1.18  CALCIUM 9.3 8.6*  --  8.8*   Liver Function Tests: No results for input(s): AST, ALT, ALKPHOS, BILITOT, PROT, ALBUMIN in the last 168 hours. CBG: Recent Labs  Lab 04/23/21 1957 04/23/21 2131 04/24/21 0718 04/24/21 1145 04/24/21 1637  GLUCAP 204* 204* 186* 169* 256*    Discharge time spent: greater than 30 minutes.  Signed: Berle Mull, MD [facility] 04/24/2021

## 2021-04-29 ENCOUNTER — Other Ambulatory Visit: Payer: Self-pay

## 2021-04-29 ENCOUNTER — Ambulatory Visit: Payer: 59

## 2021-04-29 ENCOUNTER — Ambulatory Visit (INDEPENDENT_AMBULATORY_CARE_PROVIDER_SITE_OTHER): Payer: Self-pay | Admitting: Podiatry

## 2021-04-29 DIAGNOSIS — M869 Osteomyelitis, unspecified: Secondary | ICD-10-CM

## 2021-04-29 DIAGNOSIS — Z9889 Other specified postprocedural states: Secondary | ICD-10-CM

## 2021-04-29 MED ORDER — DOXYCYCLINE HYCLATE 100 MG PO TABS: 100.0000 mg | ORAL_TABLET | Freq: Two times a day (BID) | ORAL | 0 refills | Status: AC

## 2021-04-29 MED ORDER — CIPROFLOXACIN HCL 500 MG PO TABS: 500.0000 mg | ORAL_TABLET | Freq: Two times a day (BID) | ORAL | 0 refills | Status: AC

## 2021-05-01 ENCOUNTER — Telehealth: Payer: Self-pay | Admitting: Podiatry

## 2021-05-01 NOTE — Progress Notes (Signed)
Subjective: ?51 year old male presents the office today status post right foot transmetatarsal amputation performed on April 22, 2021.  He states he has been doing well and has remained nonweightbearing.  He is currently on antibiotics without any side effects.  He denies any fevers or chills.  No nausea or vomiting.  No chest pain or shortness of breath.  He presents today with his wife as well as a Nurse, learning disability. ? ?Objective: ?AAO x3, NAD ?DP/PT pulses palpable bilaterally, CRT less than 3 seconds ?Status post right transmetatarsal potation.  Incisions healing well for this time postoperatively.  Sutures are intact without any dehiscence.  No drainage or pus.  On the dorsal aspect of foot there is a small fluid-filled blister which is clear.  There is no purulence.  There is some edema present to the foot but there is no erythema or warmth.  There is no fluctuation or crepitation.  There is no malodor. ?No pain with calf compression, swelling, warmth, erythema ? ?Assessment: ?Status post right transmetatarsal amputation; blister ? ?Plan: ?-All treatment options discussed with the patient including all alternatives, risks, complications.  ?-I cleaned the incision and upon cleaning this the blister easily came off and there was no significant break in the skin underneath this.  We did notice from the swelling and possibly the bandage.  I cleaned the incision with saline and a small mount of Betadine was applied to the incision as well as the blister site.  It was then rewrapped and sure to pad the areas.  Recommended daily dressing changes. ?-Continue nonweightbearing. ?-Continue current antibiotics which I refilled today. ?-Monitor for any clinical signs or symptoms of infection and directed to call the office immediately should any occur or go to the ER. ? ?Return in about 1 week (around 05/06/2021). ? ?Vivi Barrack DPM ?

## 2021-05-01 NOTE — Telephone Encounter (Signed)
Pt son called to get better instructions on bandage chanes. I transferred to the nurse line ? ?

## 2021-05-01 NOTE — Telephone Encounter (Signed)
Patient is needing clarification on what gauzes to switch out, what medication to use on the area, how often to change.Please advise. ?

## 2021-05-02 ENCOUNTER — Encounter: Payer: Self-pay | Admitting: Podiatry

## 2021-05-02 NOTE — Telephone Encounter (Signed)
Called and gave recommendations/instructions to patient's son, verbalized understanding

## 2021-05-05 ENCOUNTER — Ambulatory Visit (INDEPENDENT_AMBULATORY_CARE_PROVIDER_SITE_OTHER): Payer: 59 | Admitting: Podiatry

## 2021-05-05 ENCOUNTER — Other Ambulatory Visit: Payer: Self-pay

## 2021-05-05 DIAGNOSIS — M869 Osteomyelitis, unspecified: Secondary | ICD-10-CM

## 2021-05-05 DIAGNOSIS — Z9889 Other specified postprocedural states: Secondary | ICD-10-CM

## 2021-05-08 NOTE — Progress Notes (Signed)
Subjective: ?51 year old male presents the office today status post right foot transmetatarsal amputation performed on April 22, 2021.  His wife has been changing the bandage and he has been doing well not having any pain.  Denies any fevers, chills, nausea, vomiting.  No other concerns today. ? ?Objective: ?AAO x3, NAD-presents with wife and translator ?DP/PT pulses palpable bilaterally, CRT less than 3 seconds ?Status post right transmetatarsal amputation.  Incisions healing well for this time postoperatively with sutures intact without any evidence of dehiscence.   No drainage or pus.  Area of blister is resolved there is no new blister formation.  There is mild edema.  Is no erythema or warmth.  There is no purulence.   ?No pain with calf compression, swelling, warmth, erythema ? ?Assessment: ?Status post right transmetatarsal amputation; blister ? ?Plan: ?-All treatment options discussed with the patient including all alternatives, risks, complications.  ?-Incisions healing well.  No new blister formation and the blister that was present has resolved.  I cleaned the incision with gauze applied followed by dry sterile dressing.  Continue nonweightbearing in cam boot.  Continue elevation.  Monitor closely for any signs or symptoms of infection. ? ?Return in about 1 week (around 05/12/2021). ? ?Vivi Barrack DPM ?

## 2021-05-12 ENCOUNTER — Other Ambulatory Visit: Payer: Self-pay

## 2021-05-12 ENCOUNTER — Ambulatory Visit (INDEPENDENT_AMBULATORY_CARE_PROVIDER_SITE_OTHER): Payer: 59 | Admitting: Podiatry

## 2021-05-12 DIAGNOSIS — M869 Osteomyelitis, unspecified: Secondary | ICD-10-CM

## 2021-05-12 DIAGNOSIS — Z9889 Other specified postprocedural states: Secondary | ICD-10-CM

## 2021-05-13 NOTE — Progress Notes (Signed)
Subjective: ?51 year old male presents the office today status post right foot transmetatarsal amputation performed on April 22, 2021.  His wife has been changing the bandage and he has been doing well not having any pain.  Presents today for possible suture removal.  Denies any fevers, chills, nausea, vomiting.  No other concerns today. ? ?Objective: ?AAO x3, NAD-presents with wife and translator ?DP/PT pulses palpable bilaterally, CRT less than 3 seconds ?Status post right transmetatarsal amputation.  Incisions healing well for this time postoperatively with sutures intact without any evidence of dehiscence.   No drainage or pus.  No blisters present.  There is still some mild edema present but there is no erythema or warmth.  There is no tenderness to palpation.  No pain with calf compression, swelling, warmth, erythema ? ?Assessment: ?Status post right transmetatarsal amputation ? ?Plan: ?-All treatment options discussed with the patient including all alternatives, risks, complications.  ?-Incisions healing well.  I removed The sutures today and incision still well coapted.  I cleaned the incision with saline.  Betadine was applied followed by dry sterile dressing.  Continue with daily dressing changes.  Continue nonweightbearing, elevation and immobilization in cam boot. ?-Monitor for any clinical signs or symptoms of infection and directed to call the office immediately should any occur or go to the ER. ? ?Return in about 1 week (around 05/19/2021).  Possible suture removal ? ?Trula Slade DPM ?

## 2021-05-19 ENCOUNTER — Ambulatory Visit (INDEPENDENT_AMBULATORY_CARE_PROVIDER_SITE_OTHER): Payer: 59

## 2021-05-19 ENCOUNTER — Ambulatory Visit (INDEPENDENT_AMBULATORY_CARE_PROVIDER_SITE_OTHER): Payer: 59 | Admitting: Podiatry

## 2021-05-19 DIAGNOSIS — Z9889 Other specified postprocedural states: Secondary | ICD-10-CM

## 2021-05-19 DIAGNOSIS — M869 Osteomyelitis, unspecified: Secondary | ICD-10-CM

## 2021-05-20 NOTE — Progress Notes (Signed)
Subjective: ?51 year old male presents the office today status post right foot transmetatarsal amputation performed on April 22, 2021.  His wife has been changing the bandage and he has been doing well not having any pain.  His wife has been continuing to change the dressing daily.  Did not see any drainage or pus or increasing swelling or redness.  No fevers or chills.  He has no pain.  No other concerns. ? ?Objective: ?AAO x3, NAD-presents with wife and translator ?DP/PT pulses palpable bilaterally, CRT less than 3 seconds ?Status post right transmetatarsal amputation.  Incisions healing well for this time postoperatively with half of the sutures intact without any evidence of dehiscence.   No drainage or pus.  No blisters present.  There is still some mild but improved edema present but there is no erythema or warmth.  There is no tenderness to palpation.  No pain with calf compression, swelling, warmth, erythema ? ?Assessment: ?Status post right transmetatarsal amputation ? ?Plan: ?-All treatment options discussed with the patient including all alternatives, risks, complications.  ?-I remove the remainder of the sutures today incision still well coapted.  Steri-Strips were applied for reinforcement.  Betadine was applied followed by dressing.  Continue with daily dressing changes.  Continue elevation which we discussed today.  Also continue cam boot and nonweightbearing. ?-Monitor for any clinical signs or symptoms of infection and directed to call the office immediately should any occur or go to the ER. ? ?Return in about 10 days (around 05/29/2021). ? ?Vivi Barrack DPM ?

## 2021-05-27 DIAGNOSIS — E113523 Type 2 diabetes mellitus with proliferative diabetic retinopathy with traction retinal detachment involving the macula, bilateral: Secondary | ICD-10-CM | POA: Diagnosis not present

## 2021-05-27 DIAGNOSIS — Z794 Long term (current) use of insulin: Secondary | ICD-10-CM | POA: Diagnosis not present

## 2021-05-29 ENCOUNTER — Ambulatory Visit (INDEPENDENT_AMBULATORY_CARE_PROVIDER_SITE_OTHER): Payer: 59 | Admitting: Podiatry

## 2021-05-29 DIAGNOSIS — Z9889 Other specified postprocedural states: Secondary | ICD-10-CM

## 2021-05-29 DIAGNOSIS — M869 Osteomyelitis, unspecified: Secondary | ICD-10-CM

## 2021-06-01 NOTE — Progress Notes (Signed)
Subjective: ?51 year old male presents the office today status post right foot transmetatarsal amputation performed on April 22, 2021.  He has been doing well.  His wife continues to change the bandage daily.  Did not see any opening, drainage or any increase in swelling or redness.  He has been nonweightbearing.  Denies any fevers or chills.  No other concerns.  ? ?Last A1c was 7.3 on April 24, 2021 ? ?Objective: ?AAO x3, NAD-presents with wife and translator ?DP/PT pulses palpable bilaterally, CRT less than 3 seconds ?Status post right transmetatarsal amputation.  Incision appears to be healed without any evidence of dehiscence.  There is no surrounding erythema, ascending cellulitis.  No drainage or pus.  No fluctuation or crepitation but there is no malodor.  Equinus is noted today. ? ?Assessment: ?Status post right transmetatarsal amputation ? ?Plan: ?-All treatment options discussed with the patient including all alternatives, risks, complications.  ?-Clean the incision today and remains to be healing well.  No signs of dehiscence or infection today.  Discussed with him still in the cam boot but he can start to transition to partial weightbearing gradually.  Continue to elevate as well.  I did discuss with him the equinus to work range of motion exercises with calf stretches.  If needed we may need to consider doing gastroc or Achilles lengthening in the future to help prevent further issues.  Daily foot inspection, glucose control. ? ?Vivi Barrack DPM ?

## 2021-06-09 ENCOUNTER — Ambulatory Visit (INDEPENDENT_AMBULATORY_CARE_PROVIDER_SITE_OTHER): Payer: 59 | Admitting: Podiatry

## 2021-06-09 DIAGNOSIS — M869 Osteomyelitis, unspecified: Secondary | ICD-10-CM

## 2021-06-09 DIAGNOSIS — Z9889 Other specified postprocedural states: Secondary | ICD-10-CM

## 2021-06-09 NOTE — Progress Notes (Signed)
Subjective: ?51 year old male presents the office today status post right foot transmetatarsal amputation performed on April 22, 2021.  He has been putting some weight on his foot but still in the cam boot.  He is having no pain.  He denies any increase in swelling and the swelling has improved.  He has been working range of motion exercises for the ankle as well.  Denies any ulcerations and the incisions healing well.  Denies any fevers or chills.  He has no other concerns today.  ? ?Last A1c was 7.3 on April 24, 2021 ? ?Objective: ?AAO x3, NAD-presents with wife and translator ?DP/PT pulses palpable bilaterally, CRT less than 3 seconds ?Status post right transmetatarsal amputation.  Incisions healing well.  Small mount of scabbing still present but there is no opening or dehiscence noted.  There is mild edema present but appears to be improved as well.  There is no drainage or pus.  No erythema or warmth.  No fluctuation or crepitation.  No significant pain on exam.  Equinus is present.   ? ? ? ? ? ? ? ?Assessment: ?Status post right transmetatarsal amputation ? ?Plan: ?-All treatment options discussed with the patient including all alternatives, risks, complications.  ?-Incision appears to be healing well.  Betadine was applied followed by dressing.  Continue with daily dressing changes but can leave the area open at nighttime.  Discussed with him gradually transition to weightbearing as tolerated in cam boot.  Discussed trying physical therapy but he does not feel comfortable starting this yet he was to wait till next appointment.  Continue at home range of motion exercises.  Continue elevation.  Discussed daily foot inspection, glucose control. ? ?Return in about 3 weeks (around 06/30/2021). ? ?Trula Slade DPM ?

## 2021-06-30 ENCOUNTER — Ambulatory Visit (INDEPENDENT_AMBULATORY_CARE_PROVIDER_SITE_OTHER): Payer: 59

## 2021-06-30 ENCOUNTER — Ambulatory Visit (INDEPENDENT_AMBULATORY_CARE_PROVIDER_SITE_OTHER): Payer: 59 | Admitting: Podiatry

## 2021-06-30 DIAGNOSIS — Z9889 Other specified postprocedural states: Secondary | ICD-10-CM

## 2021-06-30 DIAGNOSIS — M869 Osteomyelitis, unspecified: Secondary | ICD-10-CM

## 2021-06-30 DIAGNOSIS — E1165 Type 2 diabetes mellitus with hyperglycemia: Secondary | ICD-10-CM | POA: Diagnosis not present

## 2021-06-30 NOTE — Progress Notes (Signed)
SITUATION ?Reason for Consult: Evaluation for toe filler ?Patient / Caregiver Report: Patient is ready for right toe filler ? ?OBJECTIVE DATA: ?Patient History / Diagnosis:  ?  ICD-10-CM   ?1. Uncontrolled type 2 diabetes mellitus with hyperglycemia (HCC)  E11.65   ?  ?2. Post-operative state  Z98.890   ?  ? ? ?Current or Previous Devices:   None and no history ? ?Foot Examination: ?Skin presentation:   Healed ?Ulcers & Callousing:   Historical right toe ?Toe / Foot Deformities:  Transmet amputation ?Weight Bearing Presentation:  Rectus ?Sensation:    Compromised ? ?Shoe Size:    31M ? ?ORTHOTIC RECOMMENDATION ?Recommended Device: 1x right toe filler ? ?GOALS OF ORTHOSES ?- Reduce Pain ?- Prevent Foot Deformity ?- Prevent Progression of Further Foot Deformity ?- Relieve Pressure ?- Improve the Overall Biomechanical Function of the Foot and Lower Extremity. ? ?ACTIONS PERFORMED ?Potential out of pocket cost was communicated to patient. Patient understood and consent to casting. Patient was casted for Foot Orthoses via crush box. Procedure was explained and patient tolerated procedure well. Casts were shipped to central fabrication. All questions were answered and concerns addressed. ? ?PLAN ?Patient is to be scheduled for fitting in six weeks.  ? ? ?

## 2021-07-02 NOTE — Progress Notes (Signed)
Subjective: ?51 year old male presents the office today status post right foot transmetatarsal amputation performed on April 22, 2021.  States he been doing well.  His wife still changing the bandage daily.  Incision has been doing well and he is not having any pain other than some phantom pain. ? ?Last A1c was 7.3 on April 24, 2021 ? ?Objective: ?AAO x3, NAD-presents with wife and translator ?DP/PT pulses palpable bilaterally, CRT less than 3 seconds ?Status post right transmetatarsal amputation.  Incisions well-healed.  Scars present.  Decreased edema.  No erythema or warmth.  There is no tenderness on exam.  Equinus is present. ? ? ? ? ? ? ? ? ? ? ? ? ?Assessment: ?Status post right transmetatarsal amputation ? ?Plan: ?-All treatment options discussed with the patient including all alternatives, risks, complications.  ?-Overall doing better. Follow-up with today with Arlys John for measurement of insert with a toe filler.  I discussed with him continue range of motion exercises and offered formal physical therapy but he wants to do this on his own at home.  We also discussed medications including gabapentin to help with the phantom pain but he wants to hold off on further medications for now we will continue to monitor. ?-Daily foot inspection, glucose control. ? ?Return in about 3 weeks (around 07/21/2021). ? ?Vivi Barrack DPM ?

## 2021-07-21 ENCOUNTER — Ambulatory Visit (INDEPENDENT_AMBULATORY_CARE_PROVIDER_SITE_OTHER): Payer: 59 | Admitting: Podiatry

## 2021-07-21 DIAGNOSIS — M869 Osteomyelitis, unspecified: Secondary | ICD-10-CM

## 2021-07-21 DIAGNOSIS — E1165 Type 2 diabetes mellitus with hyperglycemia: Secondary | ICD-10-CM

## 2021-07-24 NOTE — Progress Notes (Signed)
Subjective: 50 year old male presents the office today status post right foot transmetatarsal amputation performed on April 22, 2021.  CC has been doing very well has not had any issues.  Still working the range of motion exercises at home.  He is awaiting his inserts.  No new sores.  No fevers or chills.   Last A1c was 7.3 on April 24, 2021  Objective: AAO x3, NAD-presents with wife and translator DP/PT pulses palpable bilaterally, CRT less than 3 seconds Status post right transmetatarsal amputation.  Incisions well-healed.  Scars present.  Minimal edema.  No erythema or warmth.  There is no tenderness on exam.  Equinus is present however this appears to be improved with his range of motion exercises at home.                  Assessment: Status post right transmetatarsal amputation-healed  Plan: -All treatment options discussed with the patient including all alternatives, risks, complications.  -Overall doing better and the incision is healed.  Awaiting his inserts.  Continue range of motion exercises.  Declines formal physical therapy.  Compression, elevation.  Daily foot inspection, glucose control.  Vivi Barrack DPM

## 2021-08-11 ENCOUNTER — Ambulatory Visit (INDEPENDENT_AMBULATORY_CARE_PROVIDER_SITE_OTHER): Payer: 59 | Admitting: Podiatry

## 2021-08-11 ENCOUNTER — Ambulatory Visit: Payer: 59

## 2021-08-11 DIAGNOSIS — Z9889 Other specified postprocedural states: Secondary | ICD-10-CM

## 2021-08-11 DIAGNOSIS — M869 Osteomyelitis, unspecified: Secondary | ICD-10-CM

## 2021-08-11 DIAGNOSIS — E1165 Type 2 diabetes mellitus with hyperglycemia: Secondary | ICD-10-CM

## 2021-08-17 NOTE — Progress Notes (Signed)
Subjective: 51 year old male presents the office today status post right foot transmetatarsal amputation performed on April 22, 2021.  States he is doing well.  Except his diabetic shoes and toe filler insert.  He has no concerns today.  Still been in the range of motion exercises at home.  No open lesions or any skin breakdown or callus formation reports.  Last A1c was 7.3 on April 24, 2021  Objective: AAO x3, NAD-presents with wife and translator DP/PT pulses palpable bilaterally, CRT less than 3 seconds Status post right transmetatarsal amputation.  Incisions well-healed.  No significant edema.  No erythema or warmth.  There is no skin breakdown or callus formation.  Improve range of motion of the ankle joint.   Assessment: Status post right transmetatarsal amputation-healed  Plan: -All treatment options discussed with the patient including all alternatives, risks, complications.  -Doing much better.  Discussed break-in instructions for shoes and daily foot inspection should there be any irritations, skin breakdown, blisters or any other issues to call me immediately.  Continue range of motion exercises.  He wants to keep doing the range of motion exercises at home.  Return in about 3 months (around 11/11/2021).  Vivi Barrack DPM

## 2021-10-28 DIAGNOSIS — E113523 Type 2 diabetes mellitus with proliferative diabetic retinopathy with traction retinal detachment involving the macula, bilateral: Secondary | ICD-10-CM | POA: Diagnosis not present

## 2021-10-28 DIAGNOSIS — Z794 Long term (current) use of insulin: Secondary | ICD-10-CM | POA: Diagnosis not present

## 2021-11-11 ENCOUNTER — Ambulatory Visit (INDEPENDENT_AMBULATORY_CARE_PROVIDER_SITE_OTHER): Payer: Self-pay | Admitting: Podiatry

## 2021-11-11 DIAGNOSIS — E1165 Type 2 diabetes mellitus with hyperglycemia: Secondary | ICD-10-CM

## 2021-11-11 DIAGNOSIS — M869 Osteomyelitis, unspecified: Secondary | ICD-10-CM

## 2021-11-11 NOTE — Progress Notes (Signed)
Subjective: 51 year old male presents the office today status post right foot transmetatarsal amputation performed on April 22, 2021.  States he is doing well.  He denies any open sores.  He has no pain.  Back to regular shoe.  He has no concerns.  He is asking about what kind of shoes he should wear the insert as he does not wear them all the time.  Last A1c was 7.3 on April 24, 2021  Objective: AAO x3, NAD-presents with wife and translator DP/PT pulses palpable bilaterally, CRT less than 3 seconds Status post right transmetatarsal amputation.  Incisions well-healed.  There is mild dry skin present on the plantar aspect of the right foot there is no open lesions, skin fissures.  Incision is well-healed and there is no edema, erythema or signs of infection.  No pain with calf compression, erythema or warmth.   Assessment: Status post right transmetatarsal amputation-healed  Plan: -All treatment options discussed with the patient including all alternatives, risks, complications.  -Patient is back to wearing a regular shoe without issues.  There is no open lesions.  Discussed moisturizer to help with the dryness.  Recommend the inserts.  We discussed different shoes to wear and he is going to go to a [employer] in order to find shoes and he does not able to find the shoes he is in contact the office and we will measure him for the shoes -Daily foot inspection.   Trula Slade DPM

## 2021-11-25 DIAGNOSIS — E113513 Type 2 diabetes mellitus with proliferative diabetic retinopathy with macular edema, bilateral: Secondary | ICD-10-CM | POA: Diagnosis not present

## 2021-11-25 DIAGNOSIS — Z794 Long term (current) use of insulin: Secondary | ICD-10-CM | POA: Diagnosis not present

## 2021-12-25 DIAGNOSIS — E113513 Type 2 diabetes mellitus with proliferative diabetic retinopathy with macular edema, bilateral: Secondary | ICD-10-CM | POA: Diagnosis not present

## 2021-12-25 DIAGNOSIS — Z794 Long term (current) use of insulin: Secondary | ICD-10-CM | POA: Diagnosis not present

## 2022-01-22 DIAGNOSIS — E113513 Type 2 diabetes mellitus with proliferative diabetic retinopathy with macular edema, bilateral: Secondary | ICD-10-CM | POA: Diagnosis not present

## 2022-01-22 DIAGNOSIS — Z794 Long term (current) use of insulin: Secondary | ICD-10-CM | POA: Diagnosis not present

## 2022-05-12 ENCOUNTER — Ambulatory Visit: Payer: Self-pay | Admitting: Podiatry

## 2022-06-15 ENCOUNTER — Ambulatory Visit (INDEPENDENT_AMBULATORY_CARE_PROVIDER_SITE_OTHER): Payer: Self-pay | Admitting: Podiatry

## 2022-06-15 DIAGNOSIS — Z9889 Other specified postprocedural states: Secondary | ICD-10-CM

## 2022-06-15 DIAGNOSIS — E119 Type 2 diabetes mellitus without complications: Secondary | ICD-10-CM

## 2022-06-15 DIAGNOSIS — E1165 Type 2 diabetes mellitus with hyperglycemia: Secondary | ICD-10-CM

## 2022-06-15 NOTE — Progress Notes (Signed)
Subjective: Chief Complaint  Patient presents with   Diabetes    Diabetic foot exam, 6 month follow up   52 year old male presents the office with above concerns.  He states he is doing well he has not had any new ulcers to his feet.  He is doing the stretching exercises for the Achilles on the right side.  He has no concerns today.  Last A1c was 8.5 on May 21, 2022.  Objective: AAO x3, NAD-presents with wife and translator DP/PT pulses palpable bilaterally, CRT less than 3 seconds Sensation decreased with Semmes Weinstein monofilament. Status post right transmetatarsal amputation.  Incisions well-healed.  There are no open lesions bilaterally.  No significant callus formation today.  The nails on the left foot are mildly elongated without any edema, erythema or signs of infection. No pain with calf compression, erythema or warmth.   Assessment: Status post right transmetatarsal amputation-healed; diabetic foot exam  Plan: -All treatment options discussed with the patient including all alternatives, risks, complications.  -Currently no ulcerations or signs of infection.  Continue moisturizer to his feet and daily foot inspection.  Is currently debrided nails x 5 without any complications or bleeding abnormality elongated. Glucose control.   Vivi Barrack DPM

## 2022-12-14 ENCOUNTER — Ambulatory Visit (INDEPENDENT_AMBULATORY_CARE_PROVIDER_SITE_OTHER): Payer: Self-pay | Admitting: Podiatry

## 2022-12-14 ENCOUNTER — Encounter: Payer: Self-pay | Admitting: Podiatry

## 2022-12-14 DIAGNOSIS — M869 Osteomyelitis, unspecified: Secondary | ICD-10-CM

## 2022-12-14 DIAGNOSIS — E1165 Type 2 diabetes mellitus with hyperglycemia: Secondary | ICD-10-CM

## 2022-12-14 DIAGNOSIS — B351 Tinea unguium: Secondary | ICD-10-CM

## 2022-12-14 NOTE — Progress Notes (Signed)
Subjective: No chief complaint on file.   52 year old male presents the office today for follow-up evaluation of his foot.  Is been doing well any new issues.  No open lesions he presents today for foot exam.  No pain.  No other concerns.   Last A1c was 7.8 on November 02, 2022  Objective: AAO x3, NAD-presents with wife and translator DP/PT pulses palpable bilaterally, CRT less than 3 seconds Sensation decreased with Semmes Weinstein monofilament. Status post right transmetatarsal amputation.  Incisions well-healed.  There are no open lesions bilaterally.  No significant callus formation today.  The nails on the left foot are mildly elongated without any edema, erythema or signs of infection. No pain with calf compression, erythema or warmth.   Assessment: Status post right transmetatarsal amputation-healed; diabetic foot exam  Plan: -All treatment options discussed with the patient including all alternatives, risks, complications.  -Currently no ulcerations or signs of infection.  Continue moisturizer to his feet and daily foot inspection.  A courtesy I sharply debrided nails x 5 without any complications or bleeding mildly elongated. Glucose control.   Richard Pittman DPM

## 2023-03-16 ENCOUNTER — Ambulatory Visit: Payer: Self-pay | Admitting: Podiatry

## 2023-03-29 ENCOUNTER — Encounter: Payer: Self-pay | Admitting: Podiatry

## 2023-03-29 ENCOUNTER — Ambulatory Visit (INDEPENDENT_AMBULATORY_CARE_PROVIDER_SITE_OTHER): Payer: Self-pay | Admitting: Podiatry

## 2023-03-29 DIAGNOSIS — E1165 Type 2 diabetes mellitus with hyperglycemia: Secondary | ICD-10-CM

## 2023-03-29 DIAGNOSIS — Z89431 Acquired absence of right foot: Secondary | ICD-10-CM

## 2023-03-29 NOTE — Progress Notes (Signed)
 Patient was seen with Dr Clydia Dart today  Patient has HX of right TMA old toefiller is wearing out  I scanned patient today for one pair of EVA accommodative DM inserts  I will add toefiller when patient returns for fitting  Inserts will provide even support and help to restore symmetry and balance  Britton Cane CPed, CFo, CFm

## 2023-03-29 NOTE — Patient Instructions (Signed)
 La diabetes mellitus y el cuidado de los pies Diabetes Mellitus and Foot Care La diabetes, tambin llamada diabetes mellitus, puede causar problemas en los pies y las piernas debido a un flujo sanguneo (circulacin) deficiente. La mala circulacin puede hacer que la piel: Se torne ms fina y seca. Se resquebraje ms fcilmente. Cicatrice ms lentamente. Se descame y agriete. Tambin puede presentar tener dao nervioso (neuropata). Esto puede causar una disminucin de la sensibilidad en las piernas y los pies. En consecuencia, es posible que no advierta heridas pequeas en los pies que pueden causar problemas ms graves. Identificar problemas y tratarlos lo antes posible es la mejor manera de evitar futuros problemas en los pies. Cmo cuidar los pies Higiene de los pies  [employer] pies todos los [address]  [address] con agua tibia y un [place]. No use agua caliente. Luego squese los pies y [employer] dedos dando palmaditas hasta que estn completamente secos. No ponga los pies en remojo. Esto puede secarle la piel. Crtese las uas de los pies en lnea recta. No escarbe debajo de las uas o alrededor [employer]. Lime los bordes de las uas con una lima o esmeril. Aplique una locin hidratante o vaselina en la piel de los pies y en las uas secas y [place]. Use una locin que no contenga alcohol ni fragancias. No aplique locin entre los dedos. Zapatos y calcetines Use calcetines de algodn o medias limpias todos los [place]. Asegrese de que no le [employer] No use medias hasta la rodilla. Estas pueden reducir el flujo de sangre a las piernas. Use zapatos que le queden bien y que sean acolchados. Revise siempre los zapatos antes de ponrselos para asegurarse de que no haya objetos en su interior. Para amoldar los zapatos, clcelos solo algunas horas por da. Esto evitar lesiones en los pies. Heridas, rasguos, durezas y callosidades  Controle sus pies diariamente para observar si hay  ampollas, cortes, moretones, llagas o enrojecimiento. Si no puede ver la planta del pie, use un espejo o pdale ayuda a Engineer, maintenance (IT). No corte las durezas o callosidades, ni trate de quitarlas con medicamentos. Si algo le ha raspado, cortado o lastimado la piel de los pies, mantenga la piel de esa zona limpia y [place]. Puede higienizar estas zonas con agua y un jabn suave. No limpie la zona con agua oxigenada, alcohol ni yodo. Si tiene una herida, un rasguo, una dureza o una callosidad en el pie, revsela varias veces al da para asegurarse de que se est curando y no se infecte. Est atento a los siguientes signos: Enrojecimiento, Optician, dispensing. Lquido o sangre. Calor. Pus o mal olor. Consejos generales No se cruce de piernas. Esto puede disminuir el flujo de sangre a los pies. No use bolsas de agua caliente ni almohadillas trmicas en los pies. Podran causar quemaduras. Si ha perdido la sensibilidad en los pies o las piernas, no sabr lo que le est sucediendo hasta que sea demasiado tarde. Proteja sus pies del calor y del fro con calzado, en la playa o sobre el pavimento caliente. Programe una cita para un examen completo de los pies por lo menos una vez al ao o con ms frecuencia si tiene [employer]. Informe todos los cortes, llagas o moretones a su mdico de inmediato. Dnde obtener ms informacin American Diabetes Association (Asociacin Estadounidense de la Diabetes): diabetes.org Association of Diabetes Care & Education Specialists (Asociacin de Especialistas en Atencin y Educacin sobre la Diabetes): diabeteseducator.org Comunquese con un  mdico si: Tiene una afeccin que aumenta su riesgo de tener infecciones y tiene cortes, llagas o moretones en los pies. Tiene una lesin que no se [place]. Tiene una zona irritada en las piernas o los pies. Siente una sensacin de ardor u hormigueo en las piernas o los pies. Siente dolor o calambres en las piernas o los pies. Las  piernas o los pies estn adormecidos. Siente los pies siempre fros. Siente dolor alrededor de [place] ua del pie. Solicite ayuda de inmediato si: Tiene una herida, un rasguo, una dureza o una callosidad en el pie y: Foye Deer signos de infeccin. Tiene fiebre. Tiene una lnea roja que sube por la pierna. Esta informacin no tiene Theme park manager el consejo del mdico. Asegrese de hacerle al mdico cualquier pregunta que tenga. Document Revised: 08/26/2021 Document Reviewed: 08/26/2021 Elsevier Patient Education  2024 [employer].

## 2023-03-29 NOTE — Progress Notes (Signed)
 Subjective: Chief Complaint  Patient presents with   [facility]    RM#13 DFC      53 year old male presents the office today for follow-up evaluation of his foot.  Is been doing well any new issues.  No open lesions he presents today for foot exam.  He is requesting new inserts.  Last A1c was 7.6 on 01/28/2023  Objective: AAO x3, NAD-presents with wife and translator DP/PT pulses palpable bilaterally, CRT less than 3 seconds Sensation decreased with Semmes Weinstein monofilament. Status post right transmetatarsal amputation.  Incisions well-healed.  There are no open lesions bilaterally.  No significant callus formation today.  The nails on the left foot are mildly elongated without any edema, erythema or signs of infection. No pain with calf compression, erythema or warmth.   Assessment: Status post right transmetatarsal amputation-healed; diabetic foot exam  Plan: -All treatment options discussed with the patient including all alternatives, risks, complications.  -Currently no ulcerations or signs of infection.  Continue moisturizer to his feet and daily foot inspection.  A courtesy I sent to the nail today with any complications or bleeding. -He was seen today by Kerney Pee, pedorthotist for inserts  Return in about 3 months (around 06/26/2023).  Charity Conch DPM

## 2023-05-13 ENCOUNTER — Ambulatory Visit: Payer: Self-pay

## 2023-05-13 DIAGNOSIS — M79673 Pain in unspecified foot: Secondary | ICD-10-CM

## 2023-05-14 NOTE — Progress Notes (Signed)
 Patient presents today to pick up custom molded foot orthotics, diagnosed with DM Right TMA  by Dr. Ardelle Anton.  Dm inserts were dispensed Right TF made in house and fit was satisfactory. Reviewed instructions for break-in and wear. Written instructions given to patient.  Patient will follow up as needed.   Richard Pittman CPed, CFo, CFm

## 2023-06-29 ENCOUNTER — Ambulatory Visit: Payer: Self-pay | Admitting: Podiatry

## 2023-07-06 ENCOUNTER — Ambulatory Visit (INDEPENDENT_AMBULATORY_CARE_PROVIDER_SITE_OTHER): Payer: Self-pay | Admitting: Podiatry

## 2023-07-06 DIAGNOSIS — Z89431 Acquired absence of right foot: Secondary | ICD-10-CM

## 2023-07-06 DIAGNOSIS — E119 Type 2 diabetes mellitus without complications: Secondary | ICD-10-CM

## 2023-07-06 DIAGNOSIS — E1165 Type 2 diabetes mellitus with hyperglycemia: Secondary | ICD-10-CM

## 2023-07-06 MED ORDER — KETOCONAZOLE 2 % EX CREA: 1.0000 | TOPICAL_CREAM | Freq: Every day | CUTANEOUS | 0 refills | Status: AC

## 2023-07-06 NOTE — Progress Notes (Signed)
 Subjective: Chief Complaint  Patient presents with   Nail Problem    Pt is here for routine foot care.    53 year old male presents the office today for follow-up evaluation of his foot and for diabetic foot evaluation.  States he has been doing well.  No open lesions noted bilaterally.  He still doing range of motion exercises.  He has no concerns to his feet today.   Last A1c was 7.6 on 01/28/2023  Objective: AAO x3, NAD-presents with wife and translator DP/PT pulses palpable bilaterally, CRT less than 3 seconds Sensation decreased with Semmes Weinstein monofilament. Status post right transmetatarsal amputation.  Incisions well-healed.  There are some dry skin present bilaterally with some peeling skin present to the plantar aspect of the feet but any skin breakdown, ulcerations or signs of infection today.  Nails are mildly elongated on the left foot no signs of infection noted today. No pain with calf compression, erythema or warmth.  Assessment: Status post right transmetatarsal amputation-healed; diabetic foot exam  Plan: -All treatment options discussed with the patient including all alternatives, risks, complications.  -Overall doing well.  No signs of infection or ulcerations.  Discussed the importance of daily foot inspection and continue close monitoring.  Prescription consult for skin as well is there could be some component of fungus with the peeling skin.  As a courtesy debrided the nails without any complications or bleeding.  Continue with supportive shoe gear, offloading.  Return for foot exam in 3 months; new inserts or insert modifications with Kerney Pee .  Charity Conch DPM

## 2023-07-06 NOTE — Patient Instructions (Signed)

## 2023-08-10 ENCOUNTER — Other Ambulatory Visit: Payer: Self-pay

## 2023-10-07 ENCOUNTER — Encounter: Payer: Self-pay | Admitting: Podiatry

## 2023-10-07 ENCOUNTER — Ambulatory Visit (INDEPENDENT_AMBULATORY_CARE_PROVIDER_SITE_OTHER): Payer: Self-pay | Admitting: Podiatry

## 2023-10-07 DIAGNOSIS — E1165 Type 2 diabetes mellitus with hyperglycemia: Secondary | ICD-10-CM

## 2023-10-07 DIAGNOSIS — Z89431 Acquired absence of right foot: Secondary | ICD-10-CM

## 2023-10-07 DIAGNOSIS — B351 Tinea unguium: Secondary | ICD-10-CM

## 2023-10-07 NOTE — Progress Notes (Signed)
 Subjective: Chief Complaint  Patient presents with   Diabetes    [facility] foot exam. 0 Improvements from our inserts. Right foot transmet amputation. NIDDM A1C 7.3. 0 pain.    53 year old male presents the office today for follow-up evaluation of his foot and for diabetic foot evaluation.  States he is doing well.  Denies any open lesions.  He was evaluated for inserts.  He had another pair but he does not like it as much as the originals  Last A1c was 7.7 on 04/21/2023  Objective: AAO x3, NAD-presents with wife and translator DP/PT pulses palpable bilaterally, CRT less than 3 seconds Sensation decreased with Semmes Weinstein monofilament. Status post right transmetatarsal amputation.  Incisions well-healed.   Nails of the left foot are hypertrophic, dystrophic with yellow, brown discoloration.  No edema, erythema or signs of infection. Dry skin present bilaterally without any skin fissures or open lesions. No tenderness on exam. No pain with calf compression, erythema or warmth.  Assessment: Right transmetatarsal amputation, symptomatic onychomycosis left foot diabetic foot exam  Plan: -All treatment options discussed with the patient including all alternatives, risks, complications.  -Overall doing well.  No signs of infection or ulcerations.  Recommend moisturizer to his feet daily, not interdigitally.   -I sharply debrided nails x 5 without any complications or bleeding.  - He send follow-up with Sueanne, pedorthist for new inserts- he stated wearing the old inserts and that he did not like. -Daily foot inspection/glucose control.  Follow-up with Sueanne next appointment for inserts, me in 3 months for foot exam.  Donnice JONELLE Fees DPM

## 2023-11-03 ENCOUNTER — Ambulatory Visit: Payer: Self-pay

## 2023-11-23 NOTE — Progress Notes (Signed)
 Toe filler ordered from [employer] discount for non-insured will owe  $300  Patient is aware financial form needs to be signed  [place]

## 2023-11-29 ENCOUNTER — Other Ambulatory Visit: Payer: Self-pay

## 2023-11-29 DIAGNOSIS — Z89431 Acquired absence of right foot: Secondary | ICD-10-CM

## 2023-11-30 ENCOUNTER — Ambulatory Visit: Payer: Self-pay

## 2023-12-08 NOTE — Progress Notes (Signed)
 Patient was fit with new Toe filler and fit was good patient would like one more ordered has appt with Dr Gershon 11/21 TF should be here by then and patient can pu  Lolita Schultze Cped, CFo, CFm  Cost is $300

## 2024-01-07 ENCOUNTER — Ambulatory Visit (INDEPENDENT_AMBULATORY_CARE_PROVIDER_SITE_OTHER): Payer: Self-pay | Admitting: Podiatry

## 2024-01-07 ENCOUNTER — Encounter: Payer: Self-pay | Admitting: Podiatry

## 2024-01-07 DIAGNOSIS — Z89431 Acquired absence of right foot: Secondary | ICD-10-CM

## 2024-01-07 DIAGNOSIS — E1165 Type 2 diabetes mellitus with hyperglycemia: Secondary | ICD-10-CM

## 2024-01-07 DIAGNOSIS — B351 Tinea unguium: Secondary | ICD-10-CM

## 2024-01-07 NOTE — Progress Notes (Signed)
 Subjective: Chief Complaint  Patient presents with   Follow-up    F/U Right transmetatarsal amputation. 0 pain. NIDDM A1C 3.60.      53 year old male presents the office today for follow-up evaluation of his foot and for diabetic foot evaluation.  States he is doing well.  He does not report any open lesions or any injuries.  States he has been doing well.   Last A1c was 7.8 on 10/25/2023  Objective: AAO x3, NAD-presents with wife and translator DP/PT pulses palpable bilaterally, CRT less than 3 seconds Sensation decreased with Semmes Weinstein monofilament. Status post right transmetatarsal amputation.  This is well-healed.  There are some mild macerated tissue on the interspace of the left foot with no skin breakdown or any edema, erythema.  No open lesions. Nails of the left foot are hypertrophic, dystrophic with yellow, brown discoloration.  No edema, erythema or signs of infection. Dry skin present bilaterally without any skin fissures or open lesions. No tenderness on exam. No pain with calf compression, erythema or warmth.  Assessment: Right transmetatarsal amputation, symptomatic onychomycosis left foot diabetic foot exam  Plan: -All treatment options discussed with the patient including all alternatives, risks, complications.  -Overall doing well.  No signs of infection or ulcerations.  Recommend moisturizer to his feet daily, not interdigitally.   -I sharply debrided nails x 5 without any complications or bleeding as a courtesy as they were minimal. -Continue inserts -Daily foot inspection/glucose control.  Return in about 6 months (around 07/06/2024).  Donnice JONELLE Fees DPM

## 2024-07-06 ENCOUNTER — Ambulatory Visit: Payer: Self-pay | Admitting: Podiatry
